# Patient Record
Sex: Female | Born: 1980 | Race: White | Hispanic: No | Marital: Married | Smoking: Never smoker
Health system: Southern US, Community
[De-identification: ages and names within clinical notes are randomized; demographics above are authoritative.]

## PROBLEM LIST (undated history)

## (undated) DIAGNOSIS — I10 Essential (primary) hypertension: Secondary | ICD-10-CM

## (undated) DIAGNOSIS — K859 Acute pancreatitis without necrosis or infection, unspecified: Secondary | ICD-10-CM

## (undated) DIAGNOSIS — M545 Low back pain, unspecified: Secondary | ICD-10-CM

## (undated) DIAGNOSIS — F1010 Alcohol abuse, uncomplicated: Secondary | ICD-10-CM

## (undated) DIAGNOSIS — G4700 Insomnia, unspecified: Secondary | ICD-10-CM

## (undated) DIAGNOSIS — F909 Attention-deficit hyperactivity disorder, unspecified type: Secondary | ICD-10-CM

## (undated) DIAGNOSIS — E119 Type 2 diabetes mellitus without complications: Secondary | ICD-10-CM

## (undated) DIAGNOSIS — F419 Anxiety disorder, unspecified: Secondary | ICD-10-CM

## (undated) DIAGNOSIS — F329 Major depressive disorder, single episode, unspecified: Secondary | ICD-10-CM

## (undated) DIAGNOSIS — F32A Depression, unspecified: Secondary | ICD-10-CM

## (undated) DIAGNOSIS — K8020 Calculus of gallbladder without cholecystitis without obstruction: Secondary | ICD-10-CM

---

## 2007-06-15 HISTORY — PX: GASTRIC BYPASS: SHX52

## 2011-02-24 HISTORY — PX: BACK SURGERY: SHX140

## 2011-03-07 HISTORY — PX: LUMBAR LAMINECTOMY: SHX95

## 2012-12-06 HISTORY — PX: HERNIA REPAIR: SHX51

## 2012-12-26 HISTORY — PX: CHOLECYSTECTOMY: SHX55

## 2013-08-28 DIAGNOSIS — Z9884 Bariatric surgery status: Secondary | ICD-10-CM | POA: Insufficient documentation

## 2014-05-13 ENCOUNTER — Emergency Department: Payer: Self-pay | Admitting: Emergency Medicine

## 2014-12-09 DIAGNOSIS — G629 Polyneuropathy, unspecified: Secondary | ICD-10-CM | POA: Insufficient documentation

## 2015-03-01 ENCOUNTER — Other Ambulatory Visit: Payer: Self-pay

## 2015-03-01 ENCOUNTER — Emergency Department: Payer: 59

## 2015-03-01 ENCOUNTER — Emergency Department
Admission: EM | Admit: 2015-03-01 | Discharge: 2015-03-01 | Disposition: A | Discharge status: Home / Self Care | Payer: 59 | Attending: Student | Admitting: Student

## 2015-03-01 ENCOUNTER — Encounter: Payer: Self-pay | Admitting: Emergency Medicine

## 2015-03-01 DIAGNOSIS — R0789 Other chest pain: Secondary | ICD-10-CM | POA: Insufficient documentation

## 2015-03-01 DIAGNOSIS — Z880 Allergy status to penicillin: Secondary | ICD-10-CM | POA: Diagnosis not present

## 2015-03-01 DIAGNOSIS — R079 Chest pain, unspecified: Secondary | ICD-10-CM

## 2015-03-01 DIAGNOSIS — Z3202 Encounter for pregnancy test, result negative: Secondary | ICD-10-CM | POA: Diagnosis not present

## 2015-03-01 DIAGNOSIS — R748 Abnormal levels of other serum enzymes: Secondary | ICD-10-CM

## 2015-03-01 DIAGNOSIS — Z9889 Other specified postprocedural states: Secondary | ICD-10-CM | POA: Diagnosis not present

## 2015-03-01 DIAGNOSIS — K859 Acute pancreatitis, unspecified: Secondary | ICD-10-CM

## 2015-03-01 DIAGNOSIS — M545 Low back pain: Secondary | ICD-10-CM | POA: Diagnosis not present

## 2015-03-01 DIAGNOSIS — G8929 Other chronic pain: Secondary | ICD-10-CM | POA: Diagnosis not present

## 2015-03-01 DIAGNOSIS — M792 Neuralgia and neuritis, unspecified: Secondary | ICD-10-CM

## 2015-03-01 LAB — URINALYSIS COMPLETE WITH MICROSCOPIC (ARMC ONLY)
BILIRUBIN URINE: NEGATIVE
Glucose, UA: NEGATIVE mg/dL
Hgb urine dipstick: NEGATIVE
Leukocytes, UA: NEGATIVE
Nitrite: NEGATIVE
Protein, ur: 30 mg/dL — AB
SPECIFIC GRAVITY, URINE: 1.033 — AB (ref 1.005–1.030)
pH: 5 (ref 5.0–8.0)

## 2015-03-01 LAB — HEPATIC FUNCTION PANEL
ALBUMIN: 3.5 g/dL (ref 3.5–5.0)
ALK PHOS: 67 U/L (ref 38–126)
ALT: 10 U/L — AB (ref 14–54)
AST: 16 U/L (ref 15–41)
Total Bilirubin: 0.1 mg/dL — ABNORMAL LOW (ref 0.3–1.2)
Total Protein: 7 g/dL (ref 6.5–8.1)

## 2015-03-01 LAB — BASIC METABOLIC PANEL
Anion gap: 6 (ref 5–15)
BUN: 18 mg/dL (ref 6–20)
CALCIUM: 8.5 mg/dL — AB (ref 8.9–10.3)
CHLORIDE: 109 mmol/L (ref 101–111)
CO2: 24 mmol/L (ref 22–32)
Creatinine, Ser: 0.68 mg/dL (ref 0.44–1.00)
GFR calc Af Amer: >60 mL/min (ref >60)
GLUCOSE: 118 mg/dL — AB (ref 65–99)
POTASSIUM: 3.3 mmol/L — AB (ref 3.5–5.1)
Sodium: 139 mmol/L (ref 135–145)

## 2015-03-01 LAB — CBC
HEMATOCRIT: 35.2 % (ref 35.0–47.0)
Hemoglobin: 11.2 g/dL — ABNORMAL LOW (ref 12.0–16.0)
MCH: 24.5 pg — ABNORMAL LOW (ref 26.0–34.0)
MCHC: 31.8 g/dL — ABNORMAL LOW (ref 32.0–36.0)
MCV: 77.1 fL — AB (ref 80.0–100.0)
PLATELETS: 316 10*3/uL (ref 150–440)
RBC: 4.56 MIL/uL (ref 3.80–5.20)
RDW: 16.5 % — ABNORMAL HIGH (ref 11.5–14.5)
WBC: 9.2 10*3/uL (ref 3.6–11.0)

## 2015-03-01 LAB — LIPASE, BLOOD: LIPASE: 67 U/L — AB (ref 22–51)

## 2015-03-01 LAB — TROPONIN I

## 2015-03-01 LAB — POCT PREGNANCY, URINE: Preg Test, Ur: NEGATIVE

## 2015-03-01 MED ORDER — SODIUM CHLORIDE 0.9 % IV BOLUS (SEPSIS)
1000.0000 mL | Freq: Once | INTRAVENOUS | Status: AC
Start: 2015-03-01 — End: 2015-03-01
  Administered 2015-03-01: 1000 mL via INTRAVENOUS

## 2015-03-01 MED ORDER — GABAPENTIN 300 MG PO CAPS
300.0000 mg | ORAL_CAPSULE | Freq: Once | ORAL | Status: AC
  Administered 2015-03-01: 300 mg via ORAL

## 2015-03-01 MED ORDER — PROMETHAZINE HCL 25 MG/ML IJ SOLN
INTRAMUSCULAR | Status: AC
  Administered 2015-03-01: 12.5 mg via INTRAVENOUS
  Filled 2015-03-01: qty 1

## 2015-03-01 MED ORDER — PROMETHAZINE HCL 25 MG RE SUPP: 25.0000 mg | Freq: Three times a day (TID) | RECTAL | Status: DC | PRN

## 2015-03-01 MED ORDER — KETOROLAC TROMETHAMINE 30 MG/ML IJ SOLN
30.0000 mg | Freq: Once | INTRAMUSCULAR | Status: AC
  Administered 2015-03-01: 30 mg via INTRAVENOUS

## 2015-03-01 MED ORDER — GABAPENTIN 300 MG PO CAPS
ORAL_CAPSULE | ORAL | Status: AC
  Administered 2015-03-01: 300 mg via ORAL
  Filled 2015-03-01: qty 1

## 2015-03-01 MED ORDER — CYCLOBENZAPRINE HCL 10 MG PO TABS
ORAL_TABLET | ORAL | Status: AC
  Administered 2015-03-01: 5 mg via ORAL
  Filled 2015-03-01: qty 1

## 2015-03-01 MED ORDER — DEXAMETHASONE SODIUM PHOSPHATE 10 MG/ML IJ SOLN
10.0000 mg | Freq: Once | INTRAMUSCULAR | Status: AC
  Administered 2015-03-01: 10 mg via INTRAMUSCULAR

## 2015-03-01 MED ORDER — KETOROLAC TROMETHAMINE 30 MG/ML IJ SOLN
INTRAMUSCULAR | Status: AC
  Administered 2015-03-01: 30 mg via INTRAVENOUS
  Filled 2015-03-01: qty 1

## 2015-03-01 MED ORDER — PROMETHAZINE HCL 25 MG/ML IJ SOLN
12.5000 mg | Freq: Once | INTRAMUSCULAR | Status: AC
  Administered 2015-03-01: 12.5 mg via INTRAVENOUS

## 2015-03-01 MED ORDER — DEXAMETHASONE SODIUM PHOSPHATE 10 MG/ML IJ SOLN
INTRAMUSCULAR | Status: AC
  Administered 2015-03-01: 10 mg via INTRAMUSCULAR
  Filled 2015-03-01: qty 1

## 2015-03-01 MED ORDER — CYCLOBENZAPRINE HCL 10 MG PO TABS
5.0000 mg | ORAL_TABLET | Freq: Once | ORAL | Status: AC
  Administered 2015-03-01: 5 mg via ORAL

## 2015-03-01 NOTE — Discharge Instructions (Signed)
Return immediately for abdominal pain that is severe, repetitive vomiting, blood in vomit or stools, inability to have a bowel movement, fever greater than 100.4, change in/worsening chest pain, any difficulty breathing, or for any other concerns.

## 2015-03-01 NOTE — ED Provider Notes (Signed)
[facility] Emergency Department Provider Note  ____________________________________________  Time seen: Approximately 8:11 AM  I have reviewed the triage vital signs and the nursing notes.   HISTORY  Chief Complaint Leg Pain and Chest Pain    HPI Stephanie Levy is a 34 y.o. 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 The patient reports that she has had this pain since February however over the past 2 weeks her doctors have been reducing her morphine and now her pain in her left leg is intolerable. She denies any trauma or swelling to the leg. She was prescribed gabapentin but has not been able to fill it yet due to insurance issues. She reports that since tapering her morphine she has also had 2 weeks of nausea, nonbloody nonbilious emesis, nonbloody diarrhea. Last night while vomiting she developed central squeezing/chest tightness which has been constant since onset. Not pleuritic in nature, not associated with any shortness of breath. No fevers. No dysuria. Movement the left leg makes the pain worse. Current severity 9 out of 10.   History reviewed. No pertinent past medical history.  There are no active problems to display for this patient.   Past Surgical History  Procedure Laterality Date  . Abdominal surgery    . Back surgery    . Cholecystectomy    . Hernia repair      No current outpatient prescriptions on file.  Allergies Peanut-containing drug products; Amoxicillin; and Citrus  No family history on file.  Social History History  Substance Use Topics  . Smoking status: Not on file  . Smokeless tobacco: Not on file  . Alcohol Use: Not on file    Review of Systems Constitutional: No fever/chills Eyes: No visual changes. ENT: No sore throat. Cardiovascular: + chest pain. Respiratory: Denies shortness of breath. Gastrointestinal: No abdominal pain.   + nausea, + vomiting.  + diarrhea.  No constipation. Genitourinary: Negative for dysuria. Musculoskeletal: + for chronic back pain. Skin: Negative for rash. Neurological: Negative for headaches, focal weakness or numbness.  10-point ROS otherwise negative.  ____________________________________________   PHYSICAL EXAM:  VITAL SIGNS: ED Triage Vitals  Enc Vitals Group     BP 03/01/15 0711 108/79 mmHg     Pulse Rate 03/01/15 0711 79     Resp 03/01/15 0711 18     Temp 03/01/15 0711 98.4 F (36.9 C)     Temp Source 03/01/15 0711 Oral     SpO2 03/01/15 0711 100 %     Weight 03/01/15 0711 263 lb (119.296 kg)     Height 03/01/15 0711  (1.727 m)     Head Cir --      Peak Flow --      Pain Score 03/01/15 0724 10     Pain Loc --      Pain Edu? --      Excl. in GC? --     Constitutional: Alert and oriented. Well appearing and in no acute distress. Eyes: Conjunctivae are normal. PERRL. EOMI. Head: Atraumatic. Nose: No congestion/rhinnorhea. Mouth/Throat: Mucous membranes are moist.  Oropharynx non-erythematous. Neck: No stridor.  Cardiovascular: Normal rate, regular rhythm. Grossly normal heart sounds.  Good peripheral circulation. Respiratory: Normal respiratory effort.  No retractions. Lungs CTAB. Gastrointestinal: Soft and nontender. No distention. No abdominal bruits. No CVA tenderness. Genitourinary: deferred Musculoskeletal: No lower extremity tenderness nor edema.  No joint effusions. Neurologic:  Normal speech and language. No  gross focal neurologic deficits are appreciated. Speech is normal. 5 out of 5 strength in bilateral upper and lower extremities, strong plantar and dorsi flexion in bilateral big toes, positive straight leg raise at 15 in the left lower extremity Skin:  Skin is warm, dry and intact. No rash noted. Psychiatric: Mood and affect are normal. Speech and behavior are normal.  ____________________________________________   LABS (all labs ordered are  listed, but only abnormal results are displayed)  Labs Reviewed  CBC - Abnormal; Notable for the following:    Hemoglobin 11.2 (*)    MCV 77.1 (*)    MCH 24.5 (*)    MCHC 31.8 (*)    RDW 16.5 (*)    All other components within normal limits  BASIC METABOLIC PANEL - Abnormal; Notable for the following:    Potassium 3.3 (*)    Glucose, Bld 118 (*)    Calcium 8.5 (*)    All other components within normal limits  HEPATIC FUNCTION PANEL - Abnormal; Notable for the following:    ALT 10 (*)    Total Bilirubin 0.1 (*)    Bilirubin, Direct <0.1 (*)    All other components within normal limits  LIPASE, BLOOD - Abnormal; Notable for the following:    Lipase 67 (*)    All other components within normal limits  URINALYSIS COMPLETEWITH MICROSCOPIC ([facility] ONLY) - Abnormal; Notable for the following:    Color, Urine YELLOW (*)    APPearance CLOUDY (*)    Ketones, ur TRACE (*)    Specific Gravity, Urine 1.033 (*)    Protein, ur 30 (*)    Bacteria, UA FEW (*)    Squamous Epithelial / LPF 6-30 (*)    All other components within normal limits  TROPONIN I  POC URINE PREG, ED  POCT PREGNANCY, URINE   ____________________________________________  EKG  ED ECG REPORT I, Gayla Doss, the attending physician, personally viewed and interpreted this ECG.   Date: 03/01/2015  EKG Time: 07:34  Rate: 79  Rhythm: normal EKG, normal sinus rhythm  Axis: Normal  Intervals:normal intervals   ST&T Change: No acute ST segment change  ____________________________________________  RADIOLOGY  CXR IMPRESSION: No active cardiopulmonary disease.  ____________________________________________   PROCEDURES  Procedure(s) performed: None  Critical Care performed: No  ____________________________________________   INITIAL IMPRESSION / ASSESSMENT AND PLAN / ED COURSE  Pertinent labs & imaging results that were available during my care of the patient were reviewed by me and considered in my  medical decision making (see chart for details).  Stephanie Levy is a 34 y.o. 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 On exam, she is generally well-appearing and in no acute distress. Vital signs stable, afebrile. EKG reassuring, troponin negative, no risk factors for PE and she is PERC negative. I suspect her chest pain is related to her vomiting/GI in nature. We'll treat her pain with nonnarcotics, give her IV fluids as well as Phenergan given her report of nausea and vomiting. She has had improvement in back pain/leg pain with steroids in the past so will give IM Decadron.  ----------------------------------------- 10:18 AM on 03/01/2015 -----------------------------------------  Labs with mild anemia and Lipase is elevated at 67 however patient has no abdominal pain at this time and she is tolerating by mouth intake. She is hemodynamic stable without tachycardia or hypotension, no ketones in her urine and she continues to appear well-hydrated. We discussed  bowel rest and immediate return to the emergency department for development of abdominal pain and/or recurrent vomiting despite phenergan. She voices understanding of this and is comfortable with discharge. She reports her pain in her leg is improved and she will follow-up with her primary care doctor. We'll discharge with Phenergan suppositories.  ____________________________________________   FINAL CLINICAL IMPRESSION(S) / ED DIAGNOSES  Final diagnoses:  Neuropathic pain of lower extremity, left  Chest pain, unspecified chest pain type  Elevated lipase      Gayla Doss, MD 03/01/15 1021

## 2015-03-01 NOTE — ED Notes (Signed)
midsternal chest pain, leg pain , n/v/d,  Starting last night

## 2015-03-05 ENCOUNTER — Emergency Department: Payer: 59

## 2015-03-05 ENCOUNTER — Encounter: Payer: Self-pay | Admitting: Emergency Medicine

## 2015-03-05 ENCOUNTER — Emergency Department
Admission: EM | Admit: 2015-03-05 | Discharge: 2015-03-05 | Disposition: A | Discharge status: Home / Self Care | Payer: 59 | Attending: Emergency Medicine | Admitting: Emergency Medicine

## 2015-03-05 DIAGNOSIS — Z880 Allergy status to penicillin: Secondary | ICD-10-CM | POA: Diagnosis not present

## 2015-03-05 DIAGNOSIS — R197 Diarrhea, unspecified: Secondary | ICD-10-CM | POA: Diagnosis not present

## 2015-03-05 DIAGNOSIS — Z3202 Encounter for pregnancy test, result negative: Secondary | ICD-10-CM | POA: Diagnosis not present

## 2015-03-05 DIAGNOSIS — R112 Nausea with vomiting, unspecified: Secondary | ICD-10-CM | POA: Diagnosis not present

## 2015-03-05 DIAGNOSIS — Z79899 Other long term (current) drug therapy: Secondary | ICD-10-CM | POA: Insufficient documentation

## 2015-03-05 DIAGNOSIS — R1084 Generalized abdominal pain: Secondary | ICD-10-CM | POA: Insufficient documentation

## 2015-03-05 LAB — COMPREHENSIVE METABOLIC PANEL
ALBUMIN: 3.6 g/dL (ref 3.5–5.0)
ALK PHOS: 79 U/L (ref 38–126)
ALT: 17 U/L (ref 14–54)
ANION GAP: 7 (ref 5–15)
AST: 13 U/L — AB (ref 15–41)
BUN: 20 mg/dL (ref 6–20)
CO2: 27 mmol/L (ref 22–32)
CREATININE: 0.63 mg/dL (ref 0.44–1.00)
Calcium: 8.9 mg/dL (ref 8.9–10.3)
Chloride: 109 mmol/L (ref 101–111)
GFR calc Af Amer: >60 mL/min (ref >60)
GFR calc non Af Amer: >60 mL/min (ref >60)
Glucose, Bld: 96 mg/dL (ref 65–99)
Potassium: 3.4 mmol/L — ABNORMAL LOW (ref 3.5–5.1)
Sodium: 143 mmol/L (ref 135–145)
Total Bilirubin: 0.2 mg/dL — ABNORMAL LOW (ref 0.3–1.2)
Total Protein: 7.2 g/dL (ref 6.5–8.1)

## 2015-03-05 LAB — CBC WITH DIFFERENTIAL/PLATELET
Basophils Absolute: 0 10*3/uL (ref 0–0.1)
Basophils Relative: 1 %
EOS ABS: 0.3 10*3/uL (ref 0–0.7)
Eosinophils Relative: 3 %
HCT: 35.2 % (ref 35.0–47.0)
Hemoglobin: 11 g/dL — ABNORMAL LOW (ref 12.0–16.0)
LYMPHS ABS: 2.5 10*3/uL (ref 1.0–3.6)
Lymphocytes Relative: 29 %
MCH: 24 pg — AB (ref 26.0–34.0)
MCHC: 31.1 g/dL — ABNORMAL LOW (ref 32.0–36.0)
MCV: 77 fL — AB (ref 80.0–100.0)
MONOS PCT: 8 %
Monocytes Absolute: 0.7 10*3/uL (ref 0.2–0.9)
NEUTROS PCT: 59 %
Neutro Abs: 5.3 10*3/uL (ref 1.4–6.5)
PLATELETS: 332 10*3/uL (ref 150–440)
RBC: 4.57 MIL/uL (ref 3.80–5.20)
RDW: 16.3 % — AB (ref 11.5–14.5)
WBC: 8.9 10*3/uL (ref 3.6–11.0)

## 2015-03-05 LAB — URINALYSIS COMPLETE WITH MICROSCOPIC (ARMC ONLY)
Bacteria, UA: NONE SEEN
Bilirubin Urine: NEGATIVE
GLUCOSE, UA: 50 mg/dL — AB
KETONES UR: NEGATIVE mg/dL
LEUKOCYTES UA: NEGATIVE
Nitrite: NEGATIVE
PH: 5 (ref 5.0–8.0)
Protein, ur: NEGATIVE mg/dL
Specific Gravity, Urine: 1.028 (ref 1.005–1.030)

## 2015-03-05 LAB — LIPASE, BLOOD: LIPASE: 65 U/L — AB (ref 22–51)

## 2015-03-05 LAB — PREGNANCY, URINE: Preg Test, Ur: NEGATIVE

## 2015-03-05 MED ORDER — FAMOTIDINE 40 MG PO TABS
40.0000 mg | ORAL_TABLET | Freq: Every evening | ORAL | Status: DC
Start: 2015-03-05 — End: 2017-10-13

## 2015-03-05 MED ORDER — IOHEXOL 240 MG/ML SOLN
25.0000 mL | Freq: Once | INTRAMUSCULAR | Status: AC | PRN
  Administered 2015-03-05: 25 mL via ORAL

## 2015-03-05 MED ORDER — DICYCLOMINE HCL 10 MG PO CAPS: 10.0000 mg | ORAL_CAPSULE | Freq: Four times a day (QID) | ORAL | Status: DC

## 2015-03-05 MED ORDER — IOHEXOL 350 MG/ML SOLN
100.0000 mL | Freq: Once | INTRAVENOUS | Status: AC | PRN
  Administered 2015-03-05: 100 mL via INTRAVENOUS

## 2015-03-05 MED ORDER — ONDANSETRON HCL 4 MG/2ML IJ SOLN
INTRAMUSCULAR | Status: AC
  Administered 2015-03-05: 4 mg via INTRAVENOUS
  Filled 2015-03-05: qty 2

## 2015-03-05 MED ORDER — ONDANSETRON HCL 4 MG/2ML IJ SOLN
4.0000 mg | Freq: Once | INTRAMUSCULAR | Status: AC
  Administered 2015-03-05: 4 mg via INTRAVENOUS

## 2015-03-05 MED ORDER — ONDANSETRON 4 MG PO TBDP: 4.0000 mg | ORAL_TABLET | Freq: Three times a day (TID) | ORAL | Status: DC | PRN

## 2015-03-05 NOTE — ED Notes (Addendum)
Pt reports here over weekend for N/V and told to return for persistent symptoms; st dx with pancreatitis; st right lower abd pain

## 2015-03-05 NOTE — ED Notes (Signed)
Patient stable at time of discharge. Patient ambulatory, and requesting immediate discharge stating "ya'll aint doing nothing for me"

## 2015-03-05 NOTE — ED Notes (Signed)
Pt has finished PO contrast. CT made aware.

## 2015-03-05 NOTE — ED Provider Notes (Signed)
[facility] Emergency Department Provider Note  ____________________________________________  Time seen: Approximately 541 AM  I have reviewed the triage vital signs and the nursing notes.   HISTORY  Chief Complaint No chief complaint on file.    HPI Stephanie Levy is a 34 y.o. female who comes into the hospital with nausea vomiting and diarrhea for 2 weeks. The patient reports that she saw Dr. Inocencio Homes over the weekend and was told that if it did not get better she should come back in for further evaluation. The patient reports that while she has been at home she's had continued abdominal pain and it has been difficult to keep down water and her medicines. The patient reports that she is also had continued diarrhea. She reports that the pain is in her right upper and right lower quadrant. The patient did send an email to her primary care physician but did not receive a response today. The patient has not had any fevers. She reports that her pain as a 9 out of 10 in intensity. The patient is here for further evaluation.   History reviewed. No pertinent past medical history.  There are no active problems to display for this patient.   Past Surgical History  Procedure Laterality Date  . Abdominal surgery    . Back surgery    . Cholecystectomy    . Hernia repair      Current Outpatient Rx  Name  Route  Sig  Dispense  Refill  . amphetamine-dextroamphetamine (ADDERALL XR) 25 MG 24 hr capsule   Oral   Take 25 mg by mouth every morning.         . busPIRone (BUSPAR) 15 MG tablet   Oral   Take 15 mg by mouth 2 (two) times daily.         Marland Kitchen desvenlafaxine (PRISTIQ) 100 MG 24 hr tablet   Oral   Take 100 mg by mouth daily.         Marland Kitchen omeprazole (PRILOSEC) 20 MG capsule   Oral   Take 20 mg by mouth daily.         Marland Kitchen oxycodone (OXY-IR) 5 MG capsule   Oral   Take 10 mg by mouth 2 (two) times daily.         . promethazine (PHENERGAN) 25 MG  suppository   Rectal   Place 1 suppository (25 mg total) rectally every 8 (eight) hours as needed for nausea or vomiting.   15 suppository   0     Allergies Peanut-containing drug products; Amoxicillin; and Citrus  No family history on file.  Social History History  Substance Use Topics  . Smoking status: Never Smoker   . Smokeless tobacco: Not on file  . Alcohol Use: No    Review of Systems Constitutional: No fever/chills Eyes: No visual changes. ENT: No sore throat. Cardiovascular: chest pain. Respiratory: Denies shortness of breath. Gastrointestinal: Abdominal pain, nausea, vomiting and diarrhea Genitourinary: Negative for dysuria. Musculoskeletal:  back pain. Skin: Negative for rash. Neurological: Negative for headaches.  10-point ROS otherwise negative.  ____________________________________________   PHYSICAL EXAM:  VITAL SIGNS: ED Triage Vitals  Enc Vitals Group     BP 03/05/15 0047 148/99 mmHg     Pulse Rate 03/05/15 0047 87     Resp 03/05/15 0047 20     Temp 03/05/15 0047 98 F (36.7 C)     Temp Source 03/05/15 0047 Oral     SpO2 03/05/15 0047 99 %  Weight 03/05/15 0047 263 lb (119.296 kg)     Height 03/05/15 0047 5\' 8"  (1.727 m)     Head Cir --      Peak Flow --      Pain Score 03/05/15 0510 10     Pain Loc --      Pain Edu? --      Excl. in GC? --     Constitutional: Alert and oriented. Well appearing and in mild distress. Eyes: Conjunctivae are normal. PERRL. EOMI. Head: Atraumatic. Nose: No congestion/rhinnorhea. Mouth/Throat: Mucous membranes are moist.  Oropharynx non-erythematous. Cardiovascular: Normal rate, regular rhythm. Grossly normal heart sounds.  Good peripheral circulation. Respiratory: Normal respiratory effort.  No retractions. Lungs CTAB. Gastrointestinal: Soft and tender in right lower quadrant and right upper quadrant. No distention. Positive bowel sounds Genitourinary: Deferred Musculoskeletal: No lower extremity  tenderness nor edema.  No joint effusions. Neurologic:  Normal speech and language. No gross focal neurologic deficits are appreciated.  Skin:  Skin is warm, dry and intact. No rash noted. Psychiatric: Mood and affect are normal.   ____________________________________________   LABS (all labs ordered are listed, but only abnormal results are displayed)  Labs Reviewed  CBC WITH DIFFERENTIAL/PLATELET - Abnormal; Notable for the following:    Hemoglobin 11.0 (*)    MCV 77.0 (*)    MCH 24.0 (*)    MCHC 31.1 (*)    RDW 16.3 (*)    All other components within normal limits  COMPREHENSIVE METABOLIC PANEL - Abnormal; Notable for the following:    Potassium 3.4 (*)    AST 13 (*)    Total Bilirubin 0.2 (*)    All other components within normal limits  LIPASE, BLOOD - Abnormal; Notable for the following:    Lipase 65 (*)    All other components within normal limits  URINALYSIS COMPLETEWITH MICROSCOPIC ([facility] ONLY) - Abnormal; Notable for the following:    Color, Urine YELLOW (*)    APPearance CLEAR (*)    Glucose, UA 50 (*)    Hgb urine dipstick 3+ (*)    Squamous Epithelial / LPF 0-5 (*)    All other components within normal limits  PREGNANCY, URINE   ____________________________________________  EKG  None ____________________________________________  RADIOLOGY  CT abdomen and pelvis: No acute findings in the abdomen or pelvis to account for the patient's symptoms. Normal appendix. Postoperative changes and incidental findings as above. ____________________________________________   PROCEDURES  Procedure(s) performed: None  Critical Care performed: No  ____________________________________________   INITIAL IMPRESSION / ASSESSMENT AND PLAN / ED COURSE  Pertinent labs & imaging results that were available during my care of the patient were reviewed by me and considered in my medical decision making (see chart for details).  The patient is a 34 year old female who  comes in with nausea vomiting and diarrhea for 2 weeks as well as some abdominal pain. The patient was seen previously but did not receive any imaging. I will give the patient a CT scan to evaluate her abdominal pain.  The patient did receive some Zofran while in the emergency department. The patient's CT scan is unremarkable. I am unsure the cause of the patient's abdominal pain vomiting and diarrhea but I feel that the patient needs to now be evaluated by gastroenterology for further evaluation of her continual symptoms. The patient was able to drink her contrast without any episodes of emesis here in the ED. I feel that the patient will be able to tolerate oral fluids with  Zofran at home. I will also give the patient a prescription for Pepcid and Carafate for possible gastritis. ____________________________________________   FINAL CLINICAL IMPRESSION(S) / ED DIAGNOSES  Final diagnoses:  Abdominal pain  Vomiting  Diarrhea       Rebecka Apley, MD 03/05/15 [phone]

## 2015-03-05 NOTE — ED Notes (Signed)
E signature not working. Patient received paperwork, understands all explained. Dcd at [phone]

## 2015-03-05 NOTE — Discharge Instructions (Signed)
Abdominal Pain  Many things can cause abdominal pain. Usually, abdominal pain is not caused by a disease and will improve without treatment. It can often be observed and treated at home. Your health care provider will do a physical exam and possibly order blood tests and X-rays to help determine the seriousness of your pain. However, in many cases, more time must pass before a clear cause of the pain can be found. Before that point, your health care provider may not know if you need more testing or further treatment.  HOME CARE INSTRUCTIONS   Monitor your abdominal pain for any changes. The following actions may help to alleviate any discomfort you are experiencing:   Only take over-the-counter or prescription medicines as directed by your health care provider.   Do not take laxatives unless directed to do so by your health care provider.   Try a clear liquid diet (broth, tea, or water) as directed by your health care provider. Slowly move to a bland diet as tolerated.  SEEK MEDICAL CARE IF:   You have unexplained abdominal pain.   You have abdominal pain associated with nausea or diarrhea.   You have pain when you urinate or have a bowel movement.   You experience abdominal pain that wakes you in the night.   You have abdominal pain that is worsened or improved by eating food.   You have abdominal pain that is worsened with eating fatty foods.   You have a fever.  SEEK IMMEDIATE MEDICAL CARE IF:    Your pain does not go away within 2 hours.   You keep throwing up (vomiting).   Your pain is felt only in portions of the abdomen, such as the right side or the left lower portion of the abdomen.   You pass bloody or black tarry stools.  MAKE SURE YOU:   Understand these instructions.    Will watch your condition.    Will get help right away if you are not doing well or get worse.   Document Released: 06/30/2005 Document Revised: 09/25/2013 Document Reviewed: 05/30/2013  ExitCare Patient Information  2015 [employer]. This information is not intended to replace advice given to you by your health care provider. Make sure you discuss any questions you have with your health care provider.  Diarrhea  Diarrhea is frequent loose and watery bowel movements. It can cause you to feel weak and dehydrated. Dehydration can cause you to become tired and thirsty, have a dry mouth, and have decreased urination that often is dark yellow. Diarrhea is a sign of another problem, most often an infection that will not last long. In most cases, diarrhea typically lasts 2-3 days. However, it can last longer if it is a sign of something more serious. It is important to treat your diarrhea as directed by your caregiver to lessen or prevent future episodes of diarrhea.  CAUSES   Some common causes include:   Gastrointestinal infections caused by viruses, bacteria, or parasites.   Food poisoning or food allergies.   Certain medicines, such as antibiotics, chemotherapy, and laxatives.   Artificial sweeteners and fructose.   Digestive disorders.  HOME CARE INSTRUCTIONS   Ensure adequate fluid intake (hydration): Have 1 cup (8 oz) of fluid for each diarrhea episode. Avoid fluids that contain simple sugars or sports drinks, fruit juices, whole milk products, and sodas. Your urine should be clear or pale yellow if you are drinking enough fluids. Hydrate with an oral rehydration solution that you   can purchase at pharmacies, retail stores, and online. You can prepare an oral rehydration solution at home by mixing the following ingredients together:    - tsp table salt.    tsp baking soda.    tsp salt substitute containing potassium chloride.   1  tablespoons sugar.   1 L (34 oz) of water.   Certain foods and beverages may increase the speed at which food moves through the gastrointestinal (GI) tract. These foods and beverages should be avoided and include:   Caffeinated and alcoholic beverages.   High-fiber foods, such as raw  fruits and vegetables, nuts, seeds, and whole grain breads and cereals.   Foods and beverages sweetened with sugar alcohols, such as xylitol, sorbitol, and mannitol.   Some foods may be well tolerated and may help thicken stool including:   Starchy foods, such as rice, toast, pasta, low-sugar cereal, oatmeal, grits, baked potatoes, crackers, and bagels.   Bananas.   Applesauce.   Add probiotic-rich foods to help increase healthy bacteria in the GI tract, such as yogurt and fermented milk products.   Wash your hands well after each diarrhea episode.   Only take over-the-counter or prescription medicines as directed by your caregiver.   Take a warm bath to relieve any burning or pain from frequent diarrhea episodes.  SEEK IMMEDIATE MEDICAL CARE IF:    You are unable to keep fluids down.   You have persistent vomiting.   You have blood in your stool, or your stools are black and tarry.   You do not urinate in 6-8 hours, or there is only a small amount of very dark urine.   You have abdominal pain that increases or localizes.   You have weakness, dizziness, confusion, or light-headedness.   You have a severe headache.   Your diarrhea gets worse or does not get better.   You have a fever or persistent symptoms for more than 2-3 days.   You have a fever and your symptoms suddenly get worse.  MAKE SURE YOU:    Understand these instructions.   Will watch your condition.   Will get help right away if you are not doing well or get worse.  Document Released: 09/10/2002 Document Revised: 02/04/2014 Document Reviewed: 05/28/2012  ExitCare Patient Information 2015 [employer]. This information is not intended to replace advice given to you by your health care provider. Make sure you discuss any questions you have with your health care provider.  Nausea and Vomiting  Nausea is a sick feeling that often comes before throwing up (vomiting). Vomiting is a reflex where stomach contents come out of your mouth.  Vomiting can cause severe loss of body fluids (dehydration). Children and elderly adults can become dehydrated quickly, especially if they also have diarrhea. Nausea and vomiting are symptoms of a condition or disease. It is important to find the cause of your symptoms.  CAUSES    Direct irritation of the stomach lining. This irritation can result from increased acid production (gastroesophageal reflux disease), infection, food poisoning, taking certain medicines (such as nonsteroidal anti-inflammatory drugs), alcohol use, or tobacco use.   Signals from the brain.These signals could be caused by a headache, heat exposure, an inner ear disturbance, increased pressure in the brain from injury, infection, a tumor, or a concussion, pain, emotional stimulus, or metabolic problems.   An obstruction in the gastrointestinal tract (bowel obstruction).   Illnesses such as diabetes, hepatitis, gallbladder problems, appendicitis, kidney problems, cancer, sepsis, atypical symptoms of a   heart attack, or eating disorders.   Medical treatments such as chemotherapy and radiation.   Receiving medicine that makes you sleep (general anesthetic) during surgery.  DIAGNOSIS  Your caregiver may ask for tests to be done if the problems do not improve after a few days. Tests may also be done if symptoms are severe or if the reason for the nausea and vomiting is not clear. Tests may include:   Urine tests.   Blood tests.   Stool tests.   Cultures (to look for evidence of infection).   X-rays or other imaging studies.  Test results can help your caregiver make decisions about treatment or the need for additional tests.  TREATMENT  You need to stay well hydrated. Drink frequently but in small amounts.You may wish to drink water, sports drinks, clear broth, or eat frozen ice pops or gelatin dessert to help stay hydrated.When you eat, eating slowly may help prevent nausea.There are also some antinausea medicines that may help  prevent nausea.  HOME CARE INSTRUCTIONS    Take all medicine as directed by your caregiver.   If you do not have an appetite, do not force yourself to eat. However, you must continue to drink fluids.   If you have an appetite, eat a normal diet unless your caregiver tells you differently.   Eat a variety of complex carbohydrates (rice, wheat, potatoes, bread), lean meats, yogurt, fruits, and vegetables.   Avoid high-fat foods because they are more difficult to digest.   Drink enough water and fluids to keep your urine clear or pale yellow.   If you are dehydrated, ask your caregiver for specific rehydration instructions. Signs of dehydration may include:   Severe thirst.   Dry lips and mouth.   Dizziness.   Dark urine.   Decreasing urine frequency and amount.   Confusion.   Rapid breathing or pulse.  SEEK IMMEDIATE MEDICAL CARE IF:    You have blood or brown flecks (like coffee grounds) in your vomit.   You have black or bloody stools.   You have a severe headache or stiff neck.   You are confused.   You have severe abdominal pain.   You have chest pain or trouble breathing.   You do not urinate at least once every 8 hours.   You develop cold or clammy skin.   You continue to vomit for longer than 24 to 48 hours.   You have a fever.  MAKE SURE YOU:    Understand these instructions.   Will watch your condition.   Will get help right away if you are not doing well or get worse.  Document Released: 09/20/2005 Document Revised: 12/13/2011 Document Reviewed: 02/17/2011  ExitCare Patient Information 2015 [employer]. This information is not intended to replace advice given to you by your health care provider. Make sure you discuss any questions you have with your health care provider.

## 2017-03-25 DIAGNOSIS — S92132A Displaced fracture of posterior process of left talus, initial encounter for closed fracture: Secondary | ICD-10-CM | POA: Insufficient documentation

## 2017-09-16 DIAGNOSIS — G894 Chronic pain syndrome: Secondary | ICD-10-CM | POA: Insufficient documentation

## 2017-10-13 ENCOUNTER — Emergency Department: Payer: BLUE CROSS/BLUE SHIELD

## 2017-10-13 ENCOUNTER — Encounter: Payer: Self-pay | Admitting: Emergency Medicine

## 2017-10-13 ENCOUNTER — Observation Stay
Admission: EM | Admit: 2017-10-13 | Discharge: 2017-10-15 | Disposition: A | Discharge status: Home / Self Care | Payer: BLUE CROSS/BLUE SHIELD | Attending: Specialist | Admitting: Specialist

## 2017-10-13 ENCOUNTER — Other Ambulatory Visit: Payer: Self-pay

## 2017-10-13 ENCOUNTER — Observation Stay: Payer: BLUE CROSS/BLUE SHIELD

## 2017-10-13 DIAGNOSIS — Z9884 Bariatric surgery status: Secondary | ICD-10-CM | POA: Diagnosis not present

## 2017-10-13 DIAGNOSIS — Z7982 Long term (current) use of aspirin: Secondary | ICD-10-CM | POA: Insufficient documentation

## 2017-10-13 DIAGNOSIS — E871 Hypo-osmolality and hyponatremia: Secondary | ICD-10-CM | POA: Diagnosis not present

## 2017-10-13 DIAGNOSIS — E860 Dehydration: Secondary | ICD-10-CM | POA: Diagnosis not present

## 2017-10-13 DIAGNOSIS — I110 Hypertensive heart disease with heart failure: Secondary | ICD-10-CM | POA: Insufficient documentation

## 2017-10-13 DIAGNOSIS — R9431 Abnormal electrocardiogram [ECG] [EKG]: Secondary | ICD-10-CM

## 2017-10-13 DIAGNOSIS — F419 Anxiety disorder, unspecified: Secondary | ICD-10-CM | POA: Insufficient documentation

## 2017-10-13 DIAGNOSIS — Z79899 Other long term (current) drug therapy: Secondary | ICD-10-CM | POA: Diagnosis not present

## 2017-10-13 DIAGNOSIS — F909 Attention-deficit hyperactivity disorder, unspecified type: Secondary | ICD-10-CM | POA: Diagnosis not present

## 2017-10-13 DIAGNOSIS — K760 Fatty (change of) liver, not elsewhere classified: Secondary | ICD-10-CM | POA: Diagnosis not present

## 2017-10-13 DIAGNOSIS — R945 Abnormal results of liver function studies: Secondary | ICD-10-CM

## 2017-10-13 DIAGNOSIS — Z6841 Body Mass Index (BMI) 40.0 and over, adult: Secondary | ICD-10-CM | POA: Diagnosis not present

## 2017-10-13 DIAGNOSIS — E119 Type 2 diabetes mellitus without complications: Secondary | ICD-10-CM | POA: Insufficient documentation

## 2017-10-13 DIAGNOSIS — E876 Hypokalemia: Secondary | ICD-10-CM | POA: Insufficient documentation

## 2017-10-13 DIAGNOSIS — Z880 Allergy status to penicillin: Secondary | ICD-10-CM | POA: Diagnosis not present

## 2017-10-13 DIAGNOSIS — F329 Major depressive disorder, single episode, unspecified: Secondary | ICD-10-CM | POA: Insufficient documentation

## 2017-10-13 DIAGNOSIS — Z9049 Acquired absence of other specified parts of digestive tract: Secondary | ICD-10-CM | POA: Diagnosis not present

## 2017-10-13 DIAGNOSIS — K759 Inflammatory liver disease, unspecified: Secondary | ICD-10-CM

## 2017-10-13 DIAGNOSIS — R7989 Other specified abnormal findings of blood chemistry: Secondary | ICD-10-CM

## 2017-10-13 DIAGNOSIS — G4700 Insomnia, unspecified: Secondary | ICD-10-CM | POA: Insufficient documentation

## 2017-10-13 DIAGNOSIS — I509 Heart failure, unspecified: Secondary | ICD-10-CM | POA: Diagnosis not present

## 2017-10-13 DIAGNOSIS — R778 Other specified abnormalities of plasma proteins: Secondary | ICD-10-CM

## 2017-10-13 DIAGNOSIS — R55 Syncope and collapse: Principal | ICD-10-CM | POA: Diagnosis present

## 2017-10-13 HISTORY — DX: Major depressive disorder, single episode, unspecified: F32.9

## 2017-10-13 HISTORY — DX: Calculus of gallbladder without cholecystitis without obstruction: K80.20

## 2017-10-13 HISTORY — DX: Alcohol abuse, uncomplicated: F10.10

## 2017-10-13 HISTORY — DX: Depression, unspecified: F32.A

## 2017-10-13 HISTORY — DX: Insomnia, unspecified: G47.00

## 2017-10-13 HISTORY — DX: Essential (primary) hypertension: I10

## 2017-10-13 HISTORY — DX: Anxiety disorder, unspecified: F41.9

## 2017-10-13 HISTORY — DX: Type 2 diabetes mellitus without complications: E11.9

## 2017-10-13 HISTORY — DX: Low back pain, unspecified: M54.50

## 2017-10-13 HISTORY — DX: Attention-deficit hyperactivity disorder, unspecified type: F90.9

## 2017-10-13 HISTORY — DX: Morbid (severe) obesity due to excess calories: E66.01

## 2017-10-13 HISTORY — DX: Acute pancreatitis without necrosis or infection, unspecified: K85.90

## 2017-10-13 HISTORY — DX: Low back pain: M54.5

## 2017-10-13 LAB — HEPATIC FUNCTION PANEL
ALK PHOS: 91 U/L (ref 38–126)
ALT: 110 U/L — ABNORMAL HIGH (ref 14–54)
AST: 290 U/L — ABNORMAL HIGH (ref 15–41)
Albumin: 3.5 g/dL (ref 3.5–5.0)
BILIRUBIN TOTAL: 1.4 mg/dL — AB (ref 0.3–1.2)
Bilirubin, Direct: 0.3 mg/dL (ref 0.1–0.5)
Indirect Bilirubin: 1.1 mg/dL — ABNORMAL HIGH (ref 0.3–0.9)
TOTAL PROTEIN: 7.3 g/dL (ref 6.5–8.1)

## 2017-10-13 LAB — BASIC METABOLIC PANEL
Anion gap: 13 (ref 5–15)
BUN: 10 mg/dL (ref 6–20)
CALCIUM: 8.7 mg/dL — AB (ref 8.9–10.3)
CO2: 21 mmol/L — ABNORMAL LOW (ref 22–32)
CREATININE: 0.9 mg/dL (ref 0.44–1.00)
Chloride: 98 mmol/L — ABNORMAL LOW (ref 101–111)
Glucose, Bld: 207 mg/dL — ABNORMAL HIGH (ref 65–99)
Potassium: 3.7 mmol/L (ref 3.5–5.1)
Sodium: 132 mmol/L — ABNORMAL LOW (ref 135–145)

## 2017-10-13 LAB — CBC
HCT: 37.2 % (ref 35.0–47.0)
Hemoglobin: 11.8 g/dL — ABNORMAL LOW (ref 12.0–16.0)
MCH: 25.4 pg — ABNORMAL LOW (ref 26.0–34.0)
MCHC: 31.7 g/dL — ABNORMAL LOW (ref 32.0–36.0)
MCV: 80.3 fL (ref 80.0–100.0)
PLATELETS: 265 10*3/uL (ref 150–440)
RBC: 4.63 MIL/uL (ref 3.80–5.20)
RDW: 19.6 % — AB (ref 11.5–14.5)
WBC: 6.9 10*3/uL (ref 3.6–11.0)

## 2017-10-13 LAB — URINALYSIS, COMPLETE (UACMP) WITH MICROSCOPIC
GLUCOSE, UA: NEGATIVE mg/dL
Ketones, ur: 15 mg/dL — AB
NITRITE: NEGATIVE
PH: 6 (ref 5.0–8.0)
Protein, ur: 100 mg/dL — AB
SPECIFIC GRAVITY, URINE: 1.01 (ref 1.005–1.030)

## 2017-10-13 LAB — URINE DRUG SCREEN, QUALITATIVE (ARMC ONLY)
AMPHETAMINES, UR SCREEN: NOT DETECTED
BARBITURATES, UR SCREEN: NOT DETECTED
Benzodiazepine, Ur Scrn: NOT DETECTED
CANNABINOID 50 NG, UR ~~LOC~~: NOT DETECTED
Cocaine Metabolite,Ur ~~LOC~~: NOT DETECTED
MDMA (ECSTASY) UR SCREEN: NOT DETECTED
Methadone Scn, Ur: NOT DETECTED
OPIATE, UR SCREEN: NOT DETECTED
Phencyclidine (PCP) Ur S: NOT DETECTED
TRICYCLIC, UR SCREEN: NOT DETECTED

## 2017-10-13 LAB — INFLUENZA PANEL BY PCR (TYPE A & B)
INFLAPCR: NEGATIVE
Influenza B By PCR: NEGATIVE

## 2017-10-13 LAB — LIPASE, BLOOD: Lipase: 60 U/L — ABNORMAL HIGH (ref 11–51)

## 2017-10-13 LAB — TROPONIN I
Troponin I: 0.15 ng/mL (ref <0.03)
Troponin I: 0.1 ng/mL (ref <0.03)
Troponin I: 0.06 ng/mL (ref <0.03)

## 2017-10-13 LAB — PROTIME-INR
INR: 1.03
Prothrombin Time: 13.4 seconds (ref 11.4–15.2)

## 2017-10-13 LAB — ACETAMINOPHEN LEVEL: Acetaminophen (Tylenol), Serum: <10 ug/mL — ABNORMAL LOW (ref 10–30)

## 2017-10-13 LAB — MAGNESIUM: Magnesium: 1.5 mg/dL — ABNORMAL LOW (ref 1.7–2.4)

## 2017-10-13 LAB — APTT: aPTT: 25 seconds (ref 24–36)

## 2017-10-13 MED ORDER — AMPHETAMINE-DEXTROAMPHETAMINE 5 MG PO TABS
20.0000 mg | ORAL_TABLET | ORAL | Status: DC
  Filled 2017-10-13: qty 4

## 2017-10-13 MED ORDER — MAGNESIUM SULFATE 2 GM/50ML IV SOLN
2.0000 g | Freq: Once | INTRAVENOUS | Status: AC
  Administered 2017-10-13: 2 g via INTRAVENOUS
  Filled 2017-10-13: qty 50

## 2017-10-13 MED ORDER — IOPAMIDOL (ISOVUE-300) INJECTION 61%
100.0000 mL | Freq: Once | INTRAVENOUS | Status: AC | PRN
  Administered 2017-10-13: 100 mL via INTRAVENOUS

## 2017-10-13 MED ORDER — ENOXAPARIN SODIUM 40 MG/0.4ML ~~LOC~~ SOLN
40.0000 mg | Freq: Two times a day (BID) | SUBCUTANEOUS | Status: DC
  Administered 2017-10-13 – 2017-10-15 (×4): 40 mg via SUBCUTANEOUS
  Filled 2017-10-13 (×4): qty 0.4

## 2017-10-13 MED ORDER — OXYCODONE HCL 5 MG PO TABS
5.0000 mg | ORAL_TABLET | Freq: Four times a day (QID) | ORAL | Status: DC | PRN
  Administered 2017-10-13 – 2017-10-15 (×6): 5 mg via ORAL
  Filled 2017-10-13 (×6): qty 1

## 2017-10-13 MED ORDER — SODIUM CHLORIDE 0.9 % IV SOLN
INTRAVENOUS | Status: DC
  Administered 2017-10-13: 17:00:00 via INTRAVENOUS

## 2017-10-13 MED ORDER — SODIUM CHLORIDE 0.9 % IV BOLUS (SEPSIS)
250.0000 mL | Freq: Once | INTRAVENOUS | Status: AC
  Administered 2017-10-13: 250 mL via INTRAVENOUS

## 2017-10-13 MED ORDER — ASPIRIN 81 MG PO CHEW
324.0000 mg | CHEWABLE_TABLET | Freq: Once | ORAL | Status: AC
  Administered 2017-10-13: 324 mg via ORAL
  Filled 2017-10-13: qty 4

## 2017-10-13 MED ORDER — POTASSIUM CHLORIDE 20 MEQ PO PACK
40.0000 meq | PACK | Freq: Two times a day (BID) | ORAL | Status: DC
  Administered 2017-10-13 (×2): 40 meq via ORAL
  Filled 2017-10-13 (×3): qty 2

## 2017-10-13 MED ORDER — ASPIRIN EC 81 MG PO TBEC
81.0000 mg | DELAYED_RELEASE_TABLET | Freq: Every day | ORAL | Status: DC
  Administered 2017-10-14 – 2017-10-15 (×2): 81 mg via ORAL
  Filled 2017-10-13 (×2): qty 1

## 2017-10-13 MED ORDER — IOPAMIDOL (ISOVUE-300) INJECTION 61%
30.0000 mL | Freq: Once | INTRAVENOUS | Status: AC | PRN
  Administered 2017-10-13: 30 mL via ORAL

## 2017-10-13 MED ORDER — ACETAMINOPHEN 325 MG PO TABS: 650.0000 mg | ORAL_TABLET | Freq: Four times a day (QID) | ORAL | Status: DC | PRN

## 2017-10-13 MED ORDER — DESVENLAFAXINE SUCCINATE ER 50 MG PO TB24
50.0000 mg | ORAL_TABLET | Freq: Every day | ORAL | Status: DC
  Administered 2017-10-14 – 2017-10-15 (×2): 50 mg via ORAL
  Filled 2017-10-13 (×2): qty 1

## 2017-10-13 MED ORDER — MAGNESIUM OXIDE 400 (241.3 MG) MG PO TABS
400.0000 mg | ORAL_TABLET | Freq: Two times a day (BID) | ORAL | Status: DC
  Administered 2017-10-13 – 2017-10-15 (×4): 400 mg via ORAL
  Filled 2017-10-13 (×4): qty 1

## 2017-10-13 MED ORDER — CLONAZEPAM 0.5 MG PO TBDP
0.5000 mg | ORAL_TABLET | Freq: Two times a day (BID) | ORAL | Status: DC
  Administered 2017-10-13 – 2017-10-15 (×4): 0.5 mg via ORAL
  Filled 2017-10-13 (×4): qty 1

## 2017-10-13 MED ORDER — TIZANIDINE HCL 4 MG PO TABS
4.0000 mg | ORAL_TABLET | Freq: Three times a day (TID) | ORAL | Status: DC | PRN
  Administered 2017-10-13 – 2017-10-14 (×2): 4 mg via ORAL
  Filled 2017-10-13 (×5): qty 1

## 2017-10-13 MED ORDER — AMPHETAMINE-DEXTROAMPHET ER 5 MG PO CP24: 25.0000 mg | ORAL_CAPSULE | Freq: Three times a day (TID) | ORAL | Status: DC

## 2017-10-13 MED ORDER — ACETAMINOPHEN 650 MG RE SUPP: 650.0000 mg | Freq: Four times a day (QID) | RECTAL | Status: DC | PRN

## 2017-10-13 NOTE — H&P (Signed)
[facility] [place] [facility]   PATIENT NAME: Stephanie Levy    MR#:  [id number]  DATE OF BIRTH:  1981-08-04  DATE OF ADMISSION:  10/13/2017  PRIMARY CARE PHYSICIAN: Dr Adaline Sill  REQUESTING/REFERRING PHYSICIAN: Dr Dorothea Glassman  CHIEF COMPLAINT:   Chief Complaint  Patient presents with  . Loss of Consciousness    HISTORY OF PRESENT ILLNESS:  Stephanie Levy  is a 37 y.o. female presents after not feeling well.  She had some drinks on Tuesday night 3 or 4 shots.  She thought she took her nighttime medications but at that time she could not feel her fingertips so she was not sure.  The next day she did not remember much.  She slept most of the day.  She took some Benadryl for an allergy.  Partner could not understand her speech very well.  She has not been eating or drinking anything for the last few days.  Her leg was hurting after the fall.  She stood up and felt lightheaded and dizzy and passed out for a few seconds.  She tried to take a shower and her lips were blue she got helped out of the shower and passed out again.  In the ER she also passed out after urinating.  No loss of urine or bowel function with these passout episodes.  Has had episodes only lasted for a few seconds.  Hospitalist services were contacted for further evaluation.  In the ER she was found to have a low magnesium, prolonged QTC, borderline troponin, elevated liver function test.  PAST MEDICAL HISTORY:   Past Medical History:  Diagnosis Date  . Depression     PAST SURGICAL HISTORY:   Past Surgical History:  Procedure Laterality Date  . ABDOMINAL SURGERY    . BACK SURGERY    . CHOLECYSTECTOMY    . GASTRIC BYPASS    . HERNIA REPAIR      SOCIAL HISTORY:   Social History   Tobacco Use  . Smoking status: Never Smoker  . Smokeless tobacco: Never Used  Substance Use Topics  . Alcohol use: Yes    FAMILY HISTORY:   Family History  Problem Relation Age of Onset  .  CAD Mother   . Diabetes Mother   . Hypertension Mother   . Asthma Mother   . Healthy Father     DRUG ALLERGIES:   Allergies  Allergen Reactions  . Peanut-Containing Drug Products Anaphylaxis  . Amoxicillin Other (See Comments)    Gi distress  . Citrus Rash    REVIEW OF SYSTEMS:  CONSTITUTIONAL: No fever, chills or sweats.  Positive for fatigue and sleeping a lot.  EYES: Left eye did have some blurred vision and some squiggly lines that she saw. EARS, NOSE, AND THROAT: No tinnitus or ear pain. No sore throat RESPIRATORY: No cough.  Some shortness of breath, no wheezing or hemoptysis.  CARDIOVASCULAR: No chest pain, orthopnea, edema.  GASTROINTESTINAL: No nausea, vomiting, diarrhea some . abdominal pain. No blood in bowel movements GENITOURINARY: No dysuria, hematuria.  ENDOCRINE: No polyuria, nocturia,  HEMATOLOGY: No anemia, easy bruising or bleeding SKIN: No rash or lesion. MUSCULOSKELETAL: Some leg pain NEUROLOGIC: Generalized weakness.  Numbness fingertips PSYCHIATRY:  history of depression  MEDICATIONS AT HOME:   Prior to Admission medications   Medication Sig Start Date End Date Taking? Authorizing Provider  amphetamine-dextroamphetamine (ADDERALL XR) 20 MG 24 hr capsule Take 25 mg by mouth 3 (three) times daily.  Yes [provider]  clonazePAM (KLONOPIN) 0.5 MG disintegrating tablet Take 1 tablet by mouth 2 (two) times daily. 09/19/17  Yes [provider]  desvenlafaxine (PRISTIQ) 100 MG 24 hr tablet Take 100 mg by mouth daily.   Yes [provider]  diphenhydrAMINE (BENADRYL) 25 mg capsule Take 25 mg by mouth every 6 (six) hours as needed.   Yes [provider]  oxycodone (OXY-IR) 5 MG capsule Take 10 mg by mouth 2 (two) times daily.   Yes [provider]  dicyclomine (BENTYL) 10 MG capsule Take 1 capsule (10 mg total) by mouth 4 (four) times daily. 03/05/15 03/19/15  Rebecka Apley, MD  famotidine (PEPCID) 40 MG tablet  Take 1 tablet (40 mg total) by mouth every evening. 03/05/15 03/04/16  Rebecka Apley, MD  omeprazole (PRILOSEC) 20 MG capsule Take 20 mg by mouth daily.    [provider]  ondansetron (ZOFRAN ODT) 4 MG disintegrating tablet Take 1 tablet (4 mg total) by mouth every 8 (eight) hours as needed for nausea or vomiting. Patient not taking: Reported on 10/13/2017 03/05/15   Rebecka Apley, MD  promethazine (PHENERGAN) 25 MG suppository Place 1 suppository (25 mg total) rectally every 8 (eight) hours as needed for nausea or vomiting. 03/01/15 02/29/16  Gayla Doss, MD  tiZANidine (ZANAFLEX) 4 MG tablet Take 1 tablet by mouth every 4 (four) hours as needed. 09/05/17   [provider]   Patient states that she only takes Pristiq 100 mg daily Adderall 20 mg 3 times daily tizanidine and Klonopin are both as needed.  VITAL SIGNS:  Blood pressure 112/76, pulse (!) 57, temperature 97.8 F (36.6 C), temperature source Oral, resp. rate (!) 24, height 5\' 8"  (1.727 m), weight 120.7 kg (266 lb), last menstrual period 10/10/2017, SpO2 100 %.  PHYSICAL EXAMINATION:  GENERAL:  37 y.o.-year-old patient lying in the bed with no acute distress.  EYES: Pupils equal, round, reactive to light and accommodation. No scleral icterus. Extraocular muscles intact.  HEENT: Head atraumatic, normocephalic. Oropharynx and nasopharynx clear.  NECK:  Supple, no jugular venous distention. No thyroid enlargement, no tenderness.  LUNGS: Normal breath sounds bilaterally, no wheezing, rales,rhonchi or crepitation. No use of accessory muscles of respiration.  CARDIOVASCULAR: S1, S2 normal. No murmurs, rubs, or gallops.  ABDOMEN: Soft, nontender, nondistended. Bowel sounds present. No organomegaly or mass.  EXTREMITIES: No pedal edema, cyanosis, or clubbing.  NEUROLOGIC: Cranial nerves II through XII are intact. Muscle strength 5/5 in all extremities. Sensation intact. Gait not checked.  PSYCHIATRIC: The patient is  alert and oriented x 3.  SKIN: No rash, lesion, or ulcer.   LABORATORY PANEL:   CBC Recent Labs  Lab 10/13/17 1054  WBC 6.9  HGB 11.8*  HCT 37.2  PLT 265   ------------------------------------------------------------------------------------------------------------------  Chemistries  Recent Labs  Lab 10/13/17 1054  NA 132*  K 3.7  CL 98*  CO2 21*  GLUCOSE 207*  BUN 10  CREATININE 0.90  CALCIUM 8.7*  MG 1.5*  AST 290*  ALT 110*  ALKPHOS 91  BILITOT 1.4*   ------------------------------------------------------------------------------------------------------------------  Cardiac Enzymes Recent Labs  Lab 10/13/17 1054  TROPONINI 0.15*   ------------------------------------------------------------------------------------------------------------------  RADIOLOGY:  Dg Chest 2 View  Result Date: 10/13/2017 CLINICAL DATA:  Pt in via [facility] from home with complaints of 3 syncopal episodes since yesterday. Pt unable to recall if she has hit her head. Pt appears drowsy, vitals WDL. Loss of appetite and nausea x 2 days. Denies  PMH. Never a smoker EXAM: CHEST  2 VIEW COMPARISON:  03/01/2015 FINDINGS: Shallow lung inflation. Heart size is within normal limits accounting for position. There are no focal consolidations or pleural effusions. No pulmonary edema. IMPRESSION: No active cardiopulmonary disease. Electronically Signed   By: Norva Pavlov M.D.   On: 10/13/2017 11:58   Ct Head Wo Contrast  Result Date: 10/13/2017 CLINICAL DATA:  Several syncopal episodes.  Questionable fall EXAM: CT HEAD WITHOUT CONTRAST TECHNIQUE: Contiguous axial images were obtained from the base of the skull through the vertex without intravenous contrast. COMPARISON:  None. FINDINGS: Brain: The ventricles are normal in size and configuration. There is no intracranial mass, hemorrhage, extra-axial fluid collection, or midline shift. Gray-white compartments appear normal. No acute infarct evident.  Vascular: There is no appreciable hyperdense vessel. There is no appreciable vascular calcification. Skull: The bony calvarium appears intact. Sinuses/Orbits: Visualized paranasal sinuses are clear. Visualized orbits appear symmetric bilaterally. Other: Visualized mastoid air cells are clear. IMPRESSION: Study within normal limits. Electronically Signed   By: Bretta Bang III M.D.   On: 10/13/2017 14:08   Ct Abdomen Pelvis W Contrast  Result Date: 10/13/2017 CLINICAL DATA:  Abdominal pain. Clinical suspicion for diverticulitis. EXAM: CT ABDOMEN AND PELVIS WITH CONTRAST TECHNIQUE: Multidetector CT imaging of the abdomen and pelvis was performed using the standard protocol following bolus administration of intravenous contrast. CONTRAST:  ISOVUE-300 IOPAMIDOL (ISOVUE-300) INJECTION 61% COMPARISON:  03/05/2015 FINDINGS: Lower Chest: No acute findings. Hepatobiliary: No hepatic masses identified. Prior cholecystectomy. No evidence of biliary obstruction. Pancreas:  No mass or inflammatory changes. Spleen: Within normal limits in size and appearance. Adrenals/Urinary Tract: No masses identified. No evidence of hydronephrosis. Stomach/Bowel: Previous gastric bypass surgery. No evidence of obstruction, inflammatory process or abnormal fluid collections. Normal appendix visualized. Vascular/Lymphatic: No pathologically enlarged lymph nodes. No abdominal aortic aneurysm. Reproductive:  No mass or other significant abnormality. Other:  None. Musculoskeletal: No suspicious bone lesions identified. Lumbar spine fusion hardware at L4-5. IMPRESSION: Previous gastric bypass surgery. No acute findings or other significant abnormality. Electronically Signed   By: Myles Rosenthal M.D.   On: 10/13/2017 14:13    EKG:   Normal sinus rhythm, prolonged QTC at 604  IMPRESSION AND PLAN:   1.  Syncope and not eating for the last few days.  We will give IV fluid hydration.  Monitor on telemetry.  Get MRI of the brain to  rule out stroke with her slurred speech the other day.  Obtain echocardiogram and carotid ultrasound.  Check orthostatic vital signs.  Send off influenza swab.  Empiric aspirin. 2.  Prolonged QTC.  Monitor on telemetry.  Decrease dose of Pristiq from 100 mg down to 50 mg.  Obtain echocardiogram.  Obtain cardiology consultation. 3.  Elevated liver function test.  Send off acute hepatitis profile.  Check liver function test again tomorrow after hydration. 4.  Hypomagnesemia and hypokalemia replace magnesium IV and potassium orally. 5.  Depression.  Lower dose of Pristiq from 100 mg down to 50 mg secondary to QTC prolongation. 6.  Elevated troponin.  Not quite sure what to make of this but I will get serial troponins monitor on telemetry and have cardiology evaluate.    All the records are reviewed and case discussed with ED provider. Management plans discussed with the patient, family and they are in agreement.  CODE STATUS: Full code  TOTAL TIME TAKING CARE OF THIS PATIENT: 50 minutes.    Alford Highland M.D on 10/13/2017 at 2:54 PM  Between 7am to 6pm - Pager - [phone]  After 6pm call admission pager [phone]  [facility]  [phone]  CC: Primary care physician; Dr. Adaline Sill
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
## 2017-10-13 NOTE — ED Notes (Signed)
Troponin of 0.1 was reported to Dr. Hilton Sinclair.

## 2017-10-13 NOTE — ED Notes (Signed)
Report given to [facility] control

## 2017-10-13 NOTE — ED Notes (Signed)
Patient denied need to void at this time. Patient and family were advised to call RN and stretcher could be moved to the room commode. Late entry.

## 2017-10-13 NOTE — ED Notes (Signed)
Patient taken to CT scan.

## 2017-10-13 NOTE — Progress Notes (Signed)
PHARMACIST - PHYSICIAN COMMUNICATION  CONCERNING:  Enoxaparin (Lovenox) for DVT Prophylaxis    RECOMMENDATION: Patient was prescribed enoxaprin 40mg  q24 hours for VTE prophylaxis.   Filed Weights   10/13/17 1057  Weight: 266 lb (120.7 kg)    Body mass index is 40.45 kg/m.  Estimated Creatinine Clearance: 119.3 mL/min (by C-G formula based on SCr of 0.9 mg/dL).   Based on [facility] policy patient is candidate for enoxaparin 40mg  every 12 hour dosing due to BMI being >40.  DESCRIPTION: Pharmacy has adjusted enoxaparin dose per [facility] policy, approved through [employer] committee.  Patient is now receiving enoxaparin 40mg  every 12 hours.   Cher Nakai, PharmD, BCPS Clinical Pharmacist  10/13/2017 4:46 PM

## 2017-10-13 NOTE — ED Notes (Addendum)
This RN was called into the room. CT tech Judeth Cornfield was present and stated that patient sat down on the floor when she got up from the commode. Patient denies injury from the incident. Patient and spouse did not call for help before ambulating patient to the room commode as instructed. Patient was alert and oriented upon this RN arrival. Patient was back on the commode having a bowel movement. Patient was assisted onto the stretcher and was steady at that time. Dr. Darnelle Catalan aware.

## 2017-10-13 NOTE — ED Notes (Signed)
Dr. Darnelle Catalan aware of troponin of 0.15.

## 2017-10-13 NOTE — ED Triage Notes (Signed)
Pt in via [facility] from home with complaints of 3 syncopal episodes since yesterday.  Pt unable to recall if she has hit her head.  Pt appears drowsy, vitals WDL.

## 2017-10-13 NOTE — Plan of Care (Signed)
Pt admitted this shift from ED . A&Ox4. VSS. RA . OOB with standby assist. NSR on monitor. Droplet precautions initiated per order.  No complaints thus far. Will continue to monitor and report to oncoming RN .  Progressing Education: Knowledge of General Education information will improve 10/13/2017 1712 - Progressing by Jodie Echevaria, RN Health Behavior/Discharge Planning: Ability to manage health-related needs will improve 10/13/2017 1712 - Progressing by Jodie Echevaria, RN Clinical Measurements: Ability to maintain clinical measurements within normal limits will improve 10/13/2017 1712 - Progressing by Jodie Echevaria, RN Will remain free from infection 10/13/2017 1712 - Progressing by Jodie Echevaria, RN Diagnostic test results will improve 10/13/2017 1712 - Progressing by Jodie Echevaria, RN Respiratory complications will improve 10/13/2017 1712 - Progressing by Jodie Echevaria, RN Cardiovascular complication will be avoided 10/13/2017 1712 - Progressing by Jodie Echevaria, RN Activity: Risk for activity intolerance will decrease 10/13/2017 1712 - Progressing by Jodie Echevaria, RN Nutrition: Adequate nutrition will be maintained 10/13/2017 1712 - Progressing by Jodie Echevaria, RN Coping: Level of anxiety will decrease 10/13/2017 1712 - Progressing by Jodie Echevaria, RN Elimination: Will not experience complications related to bowel motility 10/13/2017 1712 - Progressing by Jodie Echevaria, RN Will not experience complications related to urinary retention 10/13/2017 1712 - Progressing by Jodie Echevaria, RN Pain Managment: General experience of comfort will improve 10/13/2017 1712 - Progressing by Jodie Echevaria, RN Safety: Ability to remain free from injury will improve 10/13/2017 1712 - Progressing by Jodie Echevaria, RN Skin Integrity: Risk for impaired skin integrity will decrease 10/13/2017 1712 - Progressing by Jodie Echevaria, RN

## 2017-10-13 NOTE — ED Notes (Signed)
RN entered room to request urine sample. PT reports she is too afraid to have the in and out performed. Pt feels too weak to stand feels as though seh needs to have a BM so verbalized a bed pan wound not work. Pt reports she will try to stand once her wife returns to treatment room. Pt verbalized she is currently on period. RN called CT and explained pt is on period and reports there is no way she is pregnant. CT will perform CT as soon as possible.

## 2017-10-14 ENCOUNTER — Observation Stay (HOSPITAL_BASED_OUTPATIENT_CLINIC_OR_DEPARTMENT_OTHER)
Admit: 2017-10-14 | Discharge: 2017-10-14 | Disposition: A | Discharge status: Home / Self Care | Payer: BLUE CROSS/BLUE SHIELD | Attending: Internal Medicine | Admitting: Internal Medicine

## 2017-10-14 ENCOUNTER — Observation Stay: Payer: BLUE CROSS/BLUE SHIELD

## 2017-10-14 DIAGNOSIS — R55 Syncope and collapse: Secondary | ICD-10-CM

## 2017-10-14 LAB — CBC
HCT: 29.9 % — ABNORMAL LOW (ref 35.0–47.0)
HEMOGLOBIN: 9.6 g/dL — AB (ref 12.0–16.0)
MCH: 25.8 pg — ABNORMAL LOW (ref 26.0–34.0)
MCHC: 32 g/dL (ref 32.0–36.0)
MCV: 80.7 fL (ref 80.0–100.0)
Platelets: 226 10*3/uL (ref 150–440)
RBC: 3.7 MIL/uL — ABNORMAL LOW (ref 3.80–5.20)
RDW: 20.2 % — AB (ref 11.5–14.5)
WBC: 6 10*3/uL (ref 3.6–11.0)

## 2017-10-14 LAB — HEPATIC FUNCTION PANEL
ALBUMIN: 2.9 g/dL — AB (ref 3.5–5.0)
ALT: 121 U/L — ABNORMAL HIGH (ref 14–54)
AST: 229 U/L — ABNORMAL HIGH (ref 15–41)
Alkaline Phosphatase: 75 U/L (ref 38–126)
Bilirubin, Direct: <0.1 mg/dL — ABNORMAL LOW (ref 0.1–0.5)
TOTAL PROTEIN: 6 g/dL — AB (ref 6.5–8.1)
Total Bilirubin: 0.2 mg/dL — ABNORMAL LOW (ref 0.3–1.2)

## 2017-10-14 LAB — BASIC METABOLIC PANEL
ANION GAP: 7 (ref 5–15)
BUN: 12 mg/dL (ref 6–20)
CALCIUM: 8.1 mg/dL — AB (ref 8.9–10.3)
CO2: 20 mmol/L — ABNORMAL LOW (ref 22–32)
Chloride: 107 mmol/L (ref 101–111)
Creatinine, Ser: 0.67 mg/dL (ref 0.44–1.00)
GFR calc Af Amer: >60 mL/min (ref >60)
GLUCOSE: 213 mg/dL — AB (ref 65–99)
Potassium: 4.1 mmol/L (ref 3.5–5.1)
Sodium: 134 mmol/L — ABNORMAL LOW (ref 135–145)

## 2017-10-14 LAB — ECHOCARDIOGRAM COMPLETE
Height: 68 in
Weight: 4256 oz

## 2017-10-14 MED ORDER — KETOROLAC TROMETHAMINE 30 MG/ML IJ SOLN
60.0000 mg | Freq: Once | INTRAMUSCULAR | Status: DC
  Filled 2017-10-14: qty 2

## 2017-10-14 MED ORDER — TOPIRAMATE 25 MG PO TABS
50.0000 mg | ORAL_TABLET | Freq: Two times a day (BID) | ORAL | Status: DC | PRN
  Administered 2017-10-14 – 2017-10-15 (×2): 50 mg via ORAL
  Filled 2017-10-14 (×4): qty 2

## 2017-10-14 MED ORDER — KETOROLAC TROMETHAMINE 60 MG/2ML IM SOLN: 60.0000 mg | Freq: Once | INTRAMUSCULAR | Status: DC

## 2017-10-14 MED ORDER — SODIUM CHLORIDE 0.9 % IV SOLN
INTRAVENOUS | Status: DC
  Administered 2017-10-14 – 2017-10-15 (×3): via INTRAVENOUS

## 2017-10-14 MED ORDER — KETOROLAC TROMETHAMINE 60 MG/2ML IM SOLN
60.0000 mg | Freq: Once | INTRAMUSCULAR | Status: DC
  Filled 2017-10-14: qty 2

## 2017-10-14 MED ORDER — POTASSIUM CHLORIDE CRYS ER 20 MEQ PO TBCR
40.0000 meq | EXTENDED_RELEASE_TABLET | Freq: Two times a day (BID) | ORAL | Status: DC
  Administered 2017-10-14 – 2017-10-15 (×3): 40 meq via ORAL
  Filled 2017-10-14 (×3): qty 2

## 2017-10-14 NOTE — Progress Notes (Signed)
*  PRELIMINARY RESULTS* Echocardiogram 2D Echocardiogram has been performed.  Cristela Blue 10/14/2017, 9:22 AM

## 2017-10-14 NOTE — Progress Notes (Signed)
Inpatient Diabetes Program Recommendations  AACE/ADA: New Consensus Statement on Inpatient Glycemic Control (2015)  Target Ranges:  Prepandial:   less than 140 mg/dL      Peak postprandial:   less than 180 mg/dL (1-2 hours)      Critically ill patients:  140 - 180 mg/dL   Results for LAYLANI, PLANTZ (MRN [id number]) as of 10/14/2017 11:46  Ref. Range 10/13/2017 10:54 10/14/2017 05:19  Glucose Latest Ref Range: 65 - 99 mg/dL 037 (H) 048 (H)     Admit: AMS  History: 37 year old female with history of ADHD, anxiety, depression, obesity status post bariatric surgery 11 years ago and previous hypertension and diabetes that resolved after gastric bypass.   Home DM Meds: None  Current Insulin Orders: None       MD- Note patient has History of DM that apparently resolved after Gastric Bypass in 2008.  Lab glucose elevated X 2 days now.  Please consider placing orders for Novolog Sensitive Correction Scale/ SSI (0-9 units) TID AC + HS        --Will follow patient during hospitalization--  Ambrose Finland RN, MSN, CDE Diabetes Coordinator Inpatient Glycemic Control Team Team Pager: [phone] (8a-5p)

## 2017-10-14 NOTE — Progress Notes (Signed)
Pt still complaining of 10 out 10 headache and dizziness. Oxycodone given at 1550 per order.Pt is tearful per patient no relief from oxy. Dr. Katheren Shams notified, orders to give Topamax 50 mg PRN. Will continue to monitor.

## 2017-10-14 NOTE — Plan of Care (Signed)
  Progressing Education: Knowledge of General Education information will improve 10/14/2017 1505 - Progressing by Rigoberto Noel, RN Health Behavior/Discharge Planning: Ability to manage health-related needs will improve 10/14/2017 1505 - Progressing by Rigoberto Noel, RN Clinical Measurements: Will remain free from infection 10/14/2017 1505 - Progressing by Rigoberto Noel, RN Respiratory complications will improve 10/14/2017 1505 - Progressing by Rigoberto Noel, RN

## 2017-10-14 NOTE — Progress Notes (Signed)
PT Cancellation Note  Patient Details Name: Roseanna James MRN: [id number] DOB: 09/16/1981   Cancelled Treatment:    Reason Eval/Treat Not Completed: Patient at procedure or test/unavailable.  PT consult received.  Chart reviewed.  Pt currently not in room (off floor for testing).  Will re-attempt PT evaluation at a later date/time.  Hendricks Limes, PT 10/14/17, 3:18 PM [phone]

## 2017-10-14 NOTE — Progress Notes (Signed)
Pt refuses to ambulate. I educated but still refuses until she speaks to a different hospitalist. Dr. Sherryll Burger made aware. Will continue to monitor.

## 2017-10-14 NOTE — Progress Notes (Signed)
Pharmacist review of medications for QT Prolongation drugs.    None of the medications the patient was on prior to admission or during admission until this point are documented to have any significant QT prolonging effects.   Medications Prior to Admission  Medication Sig Dispense Refill  . amphetamine-dextroamphetamine (ADDERALL) 20 MG tablet Take 20 mg by mouth 3 (three) times daily. 6am noon and 6pm    . clonazePAM (KLONOPIN) 0.5 MG disintegrating tablet Take 1 tablet by mouth 2 (two) times daily.  0  . desvenlafaxine (PRISTIQ) 100 MG 24 hr tablet Take 100 mg by mouth daily.    . diphenhydrAMINE (BENADRYL) 25 mg capsule Take 25 mg by mouth every 6 (six) hours as needed.    Marland Kitchen tiZANidine (ZANAFLEX) 4 MG tablet Take 1 tablet by mouth every 4 (four) hours as needed.  0   Scheduled:  . amphetamine-dextroamphetamine  20 mg Oral 3 times per day  . aspirin EC  81 mg Oral Daily  . clonazePAM  0.5 mg Oral BID  . desvenlafaxine  50 mg Oral Daily  . enoxaparin (LOVENOX) injection  40 mg Subcutaneous Q12H  . magnesium oxide  400 mg Oral BID  . potassium chloride  40 mEq Oral BID   Infusions:  . sodium chloride 100 mL/hr at 10/13/17 1644   PRN: acetaminophen **OR** acetaminophen, oxyCODONE, tiZANidine Anti-infectives (From admission, onward)   None     Gerre Pebbles Karrington Mccravy, PharmD, MBA, [employer] Clinical Pharmacist [facility]

## 2017-10-14 NOTE — Progress Notes (Signed)
[employer] [facility] [place] [facility]   PATIENT NAME: Stephanie Levy    MR#:  [id number]  DATE OF BIRTH:  27-Jul-1981  SUBJECTIVE:  CHIEF COMPLAINT:   Chief Complaint  Patient presents with  . Loss of Consciousness    REVIEW OF SYSTEMS:  CONSTITUTIONAL: No fever, fatigue or weakness.  EYES: No blurred or double vision.  EARS, NOSE, AND THROAT: No tinnitus or ear pain.  RESPIRATORY: No cough, shortness of breath, wheezing or hemoptysis.  CARDIOVASCULAR: No chest pain, orthopnea, edema.  GASTROINTESTINAL: No nausea, vomiting, diarrhea or abdominal pain.  GENITOURINARY: No dysuria, hematuria.  ENDOCRINE: No polyuria, nocturia,  HEMATOLOGY: No anemia, easy bruising or bleeding SKIN: No rash or lesion. MUSCULOSKELETAL: No joint pain or arthritis.   NEUROLOGIC: No tingling, numbness, weakness.  PSYCHIATRY: No anxiety or depression.   ROS  DRUG ALLERGIES:   Allergies  Allergen Reactions  . Peanut-Containing Drug Products Anaphylaxis  . Amoxicillin Other (See Comments)    Gi distress  . Citrus Rash    VITALS:  Blood pressure 114/74, pulse 86, temperature 98.3 F (36.8 C), temperature source Oral, resp. rate 18, height 5\' 8"  (1.727 m), weight 120.7 kg (266 lb), last menstrual period 10/10/2017, SpO2 100 %.  PHYSICAL EXAMINATION:  GENERAL:  37 y.o.-year-old patient lying in the bed with no acute distress.  EYES: Pupils equal, round, reactive to light and accommodation. No scleral icterus. Extraocular muscles intact.  HEENT: Head atraumatic, normocephalic. Oropharynx and nasopharynx clear.  NECK:  Supple, no jugular venous distention. No thyroid enlargement, no tenderness.  LUNGS: Normal breath sounds bilaterally, no wheezing, rales,rhonchi or crepitation. No use of accessory muscles of respiration.  CARDIOVASCULAR: S1, S2 normal. No murmurs, rubs, or gallops.  ABDOMEN: Soft, nontender, nondistended. Bowel sounds present. No organomegaly or mass.  EXTREMITIES: No  pedal edema, cyanosis, or clubbing.  NEUROLOGIC: Cranial nerves II through XII are intact. Muscle strength 5/5 in all extremities. Sensation intact. Gait not checked.  PSYCHIATRIC: The patient is alert and oriented x 3.  SKIN: No obvious rash, lesion, or ulcer.   Physical Exam LABORATORY PANEL:   CBC Recent Labs  Lab 10/14/17 0519  WBC 6.0  HGB 9.6*  HCT 29.9*  PLT 226   ------------------------------------------------------------------------------------------------------------------  Chemistries  Recent Labs  Lab 10/13/17 1054 10/14/17 0519  NA 132* 134*  K 3.7 4.1  CL 98* 107  CO2 21* 20*  GLUCOSE 207* 213*  BUN 10 12  CREATININE 0.90 0.67  CALCIUM 8.7* 8.1*  MG 1.5*  --   AST 290* 229*  ALT 110* 121*  ALKPHOS 91 75  BILITOT 1.4* 0.2*   ------------------------------------------------------------------------------------------------------------------  Cardiac Enzymes Recent Labs  Lab 10/13/17 1420 10/13/17 1858  TROPONINI 0.10* 0.06*   ------------------------------------------------------------------------------------------------------------------  RADIOLOGY:  Dg Chest 2 View  Result Date: 10/13/2017 CLINICAL DATA:  Pt in via [facility] from home with complaints of 3 syncopal episodes since yesterday. Pt unable to recall if she has hit her head. Pt appears drowsy, vitals WDL. Loss of appetite and nausea x 2 days. Denies PMH. Never a smoker EXAM: CHEST  2 VIEW COMPARISON:  03/01/2015 FINDINGS: Shallow lung inflation. Heart size is within normal limits accounting for position. There are no focal consolidations or pleural effusions. No pulmonary edema. IMPRESSION: No active cardiopulmonary disease. Electronically Signed   By: Norva Pavlov M.D.   On: 10/13/2017 11:58   Ct Head Wo Contrast  Result Date: 10/13/2017 CLINICAL DATA:  Several syncopal episodes.  Questionable fall EXAM: CT HEAD  WITHOUT CONTRAST TECHNIQUE: Contiguous axial images were obtained from the  base of the skull through the vertex without intravenous contrast. COMPARISON:  None. FINDINGS: Brain: The ventricles are normal in size and configuration. There is no intracranial mass, hemorrhage, extra-axial fluid collection, or midline shift. Gray-white compartments appear normal. No acute infarct evident. Vascular: There is no appreciable hyperdense vessel. There is no appreciable vascular calcification. Skull: The bony calvarium appears intact. Sinuses/Orbits: Visualized paranasal sinuses are clear. Visualized orbits appear symmetric bilaterally. Other: Visualized mastoid air cells are clear. IMPRESSION: Study within normal limits. Electronically Signed   By: Bretta Bang III M.D.   On: 10/13/2017 14:08   Mr Brain Wo Contrast  Result Date: 10/13/2017 CLINICAL DATA:  37 y/o F; multiple falls and altered level of consciousness. EXAM: MRI HEAD WITHOUT CONTRAST TECHNIQUE: Multiplanar, multiecho pulse sequences of the brain and surrounding structures were obtained without intravenous contrast. COMPARISON:  10/13/2016 head CT.  10/13/2016 carotid ultrasound. FINDINGS: Brain: Well-circumscribed oval T1 hyperintense and T2 hypointense lesion within the mid pituitary gland measuring 8 x 17 x 6 mm (AP x ML x CC series 2, image 15 and series 10, image 23). On CT this lesion demonstrates low to intermediate attenuation. No reduced diffusion to suggest acute or early subacute infarction. No abnormal T2 signal abnormality of the brain. No structural abnormality of the brain. No focal mass effect or brain parenchymal hemorrhage. No effacement of basilar cisterns, hydrocephalus, or extra-axial collection. Vascular: Normal flow voids. Skull and upper cervical spine: Normal marrow signal. Sinuses/Orbits: Negative. Other: None. IMPRESSION: 1. Well-circumscribed lesion within mid pituitary with signal characteristics either representing subacute hematoma or proteinaceous cyst. No mass effect on the suprasellar cistern or  cisternal structures. 2. Otherwise normal MRI of the brain. These results will be called to the ordering clinician or representative by the Radiologist Assistant, and communication documented in the PACS or zVision Dashboard. Electronically Signed   By: Mitzi Hansen M.D.   On: 10/13/2017 23:04   Ct Abdomen Pelvis W Contrast  Result Date: 10/13/2017 CLINICAL DATA:  Abdominal pain. Clinical suspicion for diverticulitis. EXAM: CT ABDOMEN AND PELVIS WITH CONTRAST TECHNIQUE: Multidetector CT imaging of the abdomen and pelvis was performed using the standard protocol following bolus administration of intravenous contrast. CONTRAST:  ISOVUE-300 IOPAMIDOL (ISOVUE-300) INJECTION 61% COMPARISON:  03/05/2015 FINDINGS: Lower Chest: No acute findings. Hepatobiliary: No hepatic masses identified. Prior cholecystectomy. No evidence of biliary obstruction. Pancreas:  No mass or inflammatory changes. Spleen: Within normal limits in size and appearance. Adrenals/Urinary Tract: No masses identified. No evidence of hydronephrosis. Stomach/Bowel: Previous gastric bypass surgery. No evidence of obstruction, inflammatory process or abnormal fluid collections. Normal appendix visualized. Vascular/Lymphatic: No pathologically enlarged lymph nodes. No abdominal aortic aneurysm. Reproductive:  No mass or other significant abnormality. Other:  None. Musculoskeletal: No suspicious bone lesions identified. Lumbar spine fusion hardware at L4-5. IMPRESSION: Previous gastric bypass surgery. No acute findings or other significant abnormality. Electronically Signed   By: Myles Rosenthal M.D.   On: 10/13/2017 14:13   US Carotid Bilateral  Result Date: 10/13/2017 CLINICAL DATA:  Syncope and blurred vision. EXAM: BILATERAL CAROTID DUPLEX ULTRASOUND TECHNIQUE: Wallace Cullens scale imaging, color Doppler and duplex ultrasound were performed of bilateral carotid and vertebral arteries in the neck. COMPARISON:  None. FINDINGS: Criteria:  Quantification of carotid stenosis is based on velocity parameters that correlate the residual internal carotid diameter with NASCET-based stenosis levels, using the diameter of the distal internal carotid lumen as the denominator for stenosis measurement. The  following velocity measurements were obtained: RIGHT ICA:  118/47 cm/sec CCA:  139/24 cm/sec SYSTOLIC ICA/CCA RATIO:  0.9 DIASTOLIC ICA/CCA RATIO:  2.0 ECA:  120 cm/sec LEFT ICA:  89/37 cm/sec CCA:  134/22 cm/sec SYSTOLIC ICA/CCA RATIO:  0.7 DIASTOLIC ICA/CCA RATIO:  1.7 ECA:  94 cm/sec RIGHT CAROTID ARTERY: No focal plaque identified. Velocities and waveforms are within normal limits. No evidence of carotid stenosis. RIGHT VERTEBRAL ARTERY: Antegrade flow with normal waveform and velocity. LEFT CAROTID ARTERY: No focal plaque identified. Velocities and waveforms are within normal limits. No evidence of carotid stenosis. LEFT VERTEBRAL ARTERY: Antegrade flow with normal waveform and velocity. No evidence by carotid duplex ultrasound to suggest arterial dissection. IMPRESSION: Normal carotid duplex ultrasound demonstrating no evidence carotid stenosis or dissection. Vertebral arteries show antegrade flow bilaterally. Electronically Signed   By: Irish Lack M.D.   On: 10/13/2017 15:43    ASSESSMENT AND PLAN:  37 y.o. female  with history of depression presents with not feeling well, had some drinks on Tuesday night 3 or 4 shots, paresthesias involving fingertips, poor recall the following day after drinking, had taken some Benadryl for allergies, partner could not understand her speech very well, poor p.o. intake 3 days, hurting all over from recent fall - stood up and felt lightheaded/dizzy/LOC for a few seconds, recurrent syncopal event while trying to take a shower, repeat syncope while urinating in the emergency room, found to have a low magnesium, prolonged QTC, borderline elevated troponins, elevated liver function test, and admitted for recurrent  syncopal events  1 acute syncopal episodes Exact etiology is unknown Noted mild dehydration given ketones in urine, prolonged QTC on EKG, elevated troponins, poor p.o. intake over the last several days Condition is stable currently Continue IV fluids for rehydration, neurochecks per routine, orthostatics were negative, echocardiogram noted for stage I diastolic dysfunction, cardiology input appreciated-await further recommendations, CT abdomen was negative, CT head negative, urine drug screen negative, carotid Dopplers negative, MRI of the brain noted for pituitary abnormality-neurosurgery consulted but is currently unavailable-may follow-up as an outpatient as this is deemed not to be related to syncopal events, fall precautions, PT/OT to evaluate/treat, ambulate with assistance  2 acute Prolonged QTC Cardiac enzymes and consistent with acute coronary syndrome, continue telemetry, decreased dose of Pristiq from 100 mg down to 50 mg, echocardiogram noted above, and await further cardiology recommendations  3 acute elevated LFTs Suspect related to underlying alcohol abuse-patient denies this Follow-up on acute hepatitis panel, check right upper quadrant ultrasound for further liver evaluation, and repeat LFTs in the morning  4 acute hypomagnesemia and hypokalemia  Repleted  Check BMP with magnesium in the morning   5 chronic depression Stable  Lower dose of Pristiq from 100 mg down to 50 mg secondary to QTC prolongation as stated above  6 acute elevated troponin Inconsistent with acute coronary syndrome Cardiology following-await further recommendations, echocardiogram noted above  Full code Condition stable Prognosis fair Disposition Home on tomorrow barring any complications  All the records are reviewed and case discussed with Care Management/Social Workerr. Management plans discussed with the patient, family and they are in agreement.  CODE STATUS:   TOTAL TIME TAKING CARE OF  THIS PATIENT: 445 minutes.     POSSIBLE D/C IN 1 DAYS, DEPENDING ON CLINICAL CONDITION.   Evelena Asa Elishah Ashmore M.D on 10/14/2017   Between 7am to 6pm - Pager - [phone]  After 6pm go to [email or web address] - Social research officer, government  [employer]  [phone]  CC: Primary care physician; System, Pcp Not In  Note: This dictation was prepared with Dragon dictation along with smaller phrase technology. Any transcriptional errors that result from this process are unintentional.

## 2017-10-15 MED ORDER — METOCLOPRAMIDE HCL 5 MG/ML IJ SOLN
5.0000 mg | Freq: Once | INTRAMUSCULAR | Status: AC
  Administered 2017-10-15: 5 mg via INTRAVENOUS
  Filled 2017-10-15: qty 2

## 2017-10-15 MED ORDER — DESVENLAFAXINE SUCCINATE ER 50 MG PO TB24
50.0000 mg | ORAL_TABLET | Freq: Once | ORAL | Status: AC
  Administered 2017-10-15: 50 mg via ORAL
  Filled 2017-10-15: qty 1

## 2017-10-15 MED ORDER — BUTALBITAL-APAP-CAFFEINE 50-325-40 MG PO TABS
1.0000 | ORAL_TABLET | Freq: Four times a day (QID) | ORAL | Status: DC | PRN
  Filled 2017-10-15: qty 1

## 2017-10-15 MED ORDER — KETOROLAC TROMETHAMINE 30 MG/ML IJ SOLN
30.0000 mg | Freq: Once | INTRAMUSCULAR | Status: AC
  Administered 2017-10-15: 30 mg via INTRAVENOUS

## 2017-10-15 NOTE — Evaluation (Signed)
Physical Therapy Evaluation Patient Details Name: Stephanie Levy MRN: [id number] DOB: [birth year] Today's Date: 10/15/2017   History of Present Illness  Pt admitted following syncope and unexplained blackouts.  PMH includes depression, abdominal surgery and spinal fusion.  Clinical Impression  Pt is a 37 year old female who is generally active and lives in a one story home with her spouse.  Pt is able to perform bed mobility, transfers and ambulate 240 ft independently.  Pt reports 0/10 pain and sensation intact.  She does not require an AD for assistance with balance.  Pt is not appropriate for PT at this time.    Follow Up Recommendations No PT follow up    Equipment Recommendations       Recommendations for Other Services       Precautions / Restrictions        Mobility  Bed Mobility Overal bed mobility: Independent                Transfers Overall transfer level: Independent               General transfer comment: PT maintained close stance in proximity to pt due to recent dx but pt was able to perform transfers independently.  Ambulation/Gait Ambulation/Gait assistance: Independent Ambulation Distance (Feet): 240 Feet   Gait Pattern/deviations: WFL(Within Functional Limits)   Gait velocity interpretation: at or above normal speed for age/gender    Stairs            Wheelchair Mobility    Modified Rankin (Stroke Patients Only)       Balance Overall balance assessment: Independent                                           Pertinent Vitals/Pain Pain Assessment: No/denies pain    Home Living Family/patient expects to be discharged to:: Private residence Living Arrangements: Spouse/significant other Available Help at Discharge: Family Type of Home: House Home Access: Stairs to enter Entrance Stairs-Rails: None Secretary/administrator of Steps: 2 Home Layout: One level Home Equipment: Environmental consultant - 2 wheels      Prior  Function Level of Independence: Independent               Hand Dominance        Extremity/Trunk Assessment   Upper Extremity Assessment Upper Extremity Assessment: Overall WFL for tasks assessed    Lower Extremity Assessment Lower Extremity Assessment: Overall WFL for tasks assessed    Cervical / Trunk Assessment Cervical / Trunk Assessment: Normal  Communication   Communication: No difficulties  Cognition Arousal/Alertness: Awake/alert Behavior During Therapy: WFL for tasks assessed/performed Overall Cognitive Status: Within Functional Limits for tasks assessed                                        General Comments      Exercises     Assessment/Plan    PT Assessment Patent does not need any further PT services  PT Problem List         PT Treatment Interventions      PT Goals (Current goals can be found in the Care Plan section)  Acute Rehab PT Goals Patient Stated Goal: To return home and continue to seek answers concerning her medical condition. PT Goal Formulation: With patient Time  For Goal Achievement: 10/29/17 Potential to Achieve Goals: Good    Frequency     Barriers to discharge        Co-evaluation               AM-PAC PT "6 Clicks" Daily Activity  Outcome Measure Difficulty turning over in bed (including adjusting bedclothes, sheets and blankets)?: None Difficulty moving from lying on back to sitting on the side of the bed? : None Difficulty sitting down on and standing up from a chair with arms (e.g., wheelchair, bedside commode, etc,.)?: None Help needed moving to and from a bed to chair (including a wheelchair)?: None Help needed walking in hospital room?: None Help needed climbing 3-5 steps with a railing? : None 6 Click Score: 24    End of Session Equipment Utilized During Treatment: Gait belt Activity Tolerance: Patient tolerated treatment well Patient left: in bed;with call bell/phone within reach    PT Visit Diagnosis: History of falling (Z91.81)    Time: 1050-1010 PT Time Calculation (min) (ACUTE ONLY): 1400 min   Charges:   PT Evaluation $PT Eval Low Complexity: 1 Low     PT G Codes:   PT G-Codes **NOT FOR INPATIENT CLASS** Functional Assessment Tool Used: AM-PAC 6 Clicks Basic Mobility    Stephanie Levy, PT, DPT   Stephanie Levy 10/15/2017, 11:57 AM

## 2017-10-15 NOTE — Progress Notes (Signed)
Went over discharge instructions with the patient and family about the follow-up appointment. Discontinue peripheral IV and telemetry monitor. Patient is wanting information on her imaging including her MRI, Echo and CT head, gave patient an information for them to come Monday at medical records for those information. RN will continue to monitor.
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
## 2017-10-15 NOTE — Progress Notes (Signed)
Pt has been c/o 10/10 headache throughout the night. Multiple pain relief given with little to no effect. Pt was taking Pristiq 100 mg daily and had missed her dose on the day of admission and then her dose was reduced to 50 mg on Friday a.m. MD Sheryle Hail made aware that pt may be having headaches related to withdrawal from regular dosage. 50 mg one time Pristiq ordered. Pt educated on risks and benefits. I will pass on to day shift that pt was given additional dose of pristiq. Fioricet also ordered but contains acetaminophen so pt declined due to recent concerns over tylenol levels.

## 2017-10-15 NOTE — Progress Notes (Addendum)
[facility]    Jeniya Kuehnle was admitted to the Hospital on 10/13/2017 and Discharged  10/15/2017 and should be excused from work/school   for 6 days starting 10/13/2017 , may return to work/school without any restrictions.  Call Hilda Lias MD, [facility]  [phone] with questions.  Houston Siren M.D on 10/15/2017,at 11:21 AM

## 2017-10-16 LAB — HEPATITIS PANEL, ACUTE
Hep A IgM: NEGATIVE
Hep B C IgM: NEGATIVE
Hepatitis B Surface Ag: NEGATIVE

## 2017-10-16 LAB — HIV ANTIBODY (ROUTINE TESTING W REFLEX): HIV SCREEN 4TH GENERATION: NONREACTIVE

## 2017-10-17 ENCOUNTER — Telehealth: Payer: Self-pay | Admitting: Cardiovascular Disease

## 2017-10-17 NOTE — Telephone Encounter (Signed)
Per Dr. Kirke Corin: "This patient is hospitalized with syncope and QT prolongation. Please arrange for a 2-week outpatient monitor (Zio patch is fine) "  S/w pt who reports continued 8 out 10 chest pain since admission to [facility]. As it is constant, nothing she does improves her pain level.  This pain is not new since discharge.  Pt refuses zio monitor as she "did not appreciate the level of care I received from the hospital".  She reports she will follow up with her own PCP to see what is recommended. Reviewed Ss that would require immediate attention in an ER setting.  Advised to call back if we may ever be of any assistance.  Pt verbalized understanding.

## 2017-10-17 NOTE — Telephone Encounter (Signed)
Ok. Thanks!

## 2018-12-17 IMAGING — CT CT HEAD W/O CM
3 series · 16 of 47 positions shown, 19 images · non-contrast
Comparison: None.

CLINICAL DATA: Several syncopal episodes.  Questionable fall

EXAM:
CT HEAD WITHOUT CONTRAST
TECHNIQUE: Contiguous axial images were obtained from the base of the skull
through the vertex without intravenous contrast.

[Series 2: head wo · axial · 0.40mm/px · z∈[+391,+516]mm · 10 of 30 slices shown, 13 images]
[im 3/30  brain]
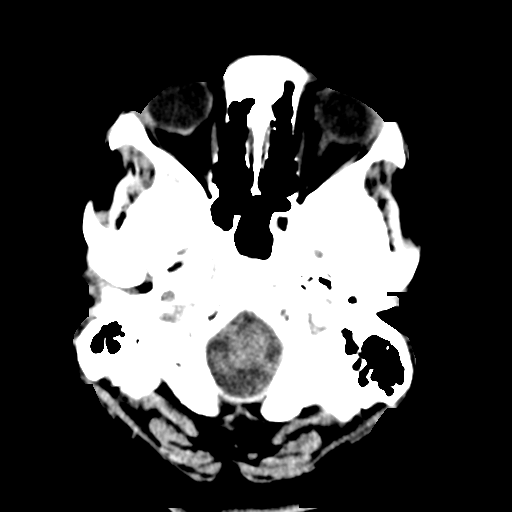
[im 3/30  bone]
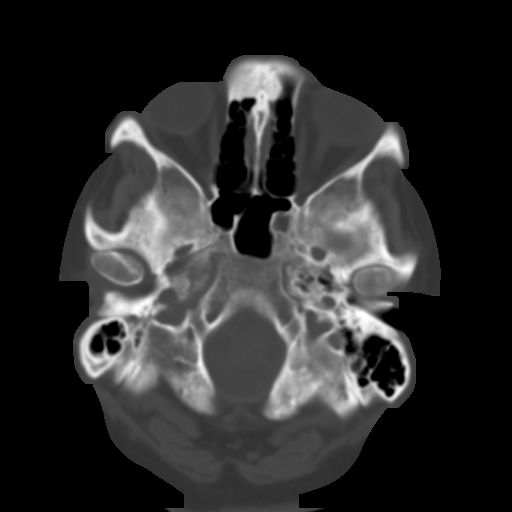
[im 6/30  brain]
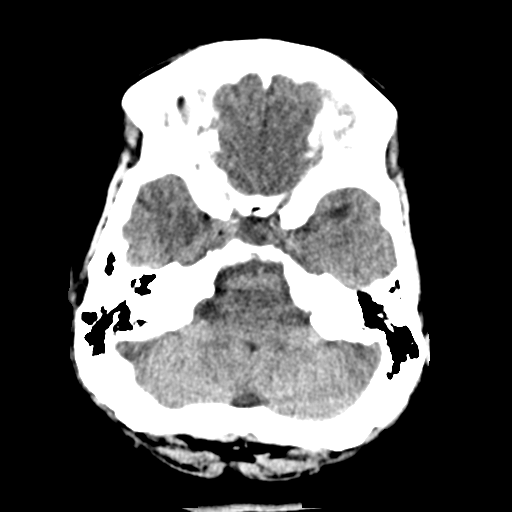
[im 9/30  brain]
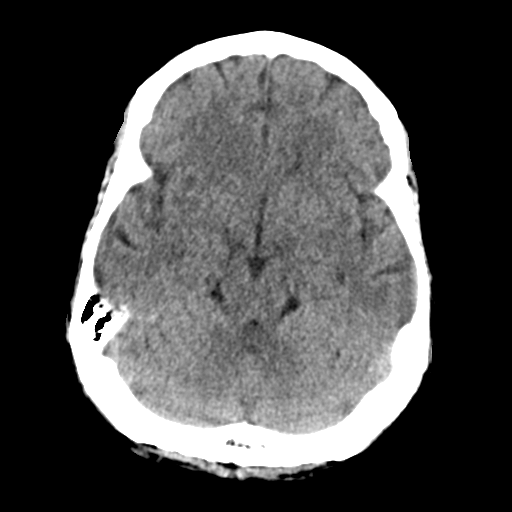
[im 11/30  brain]
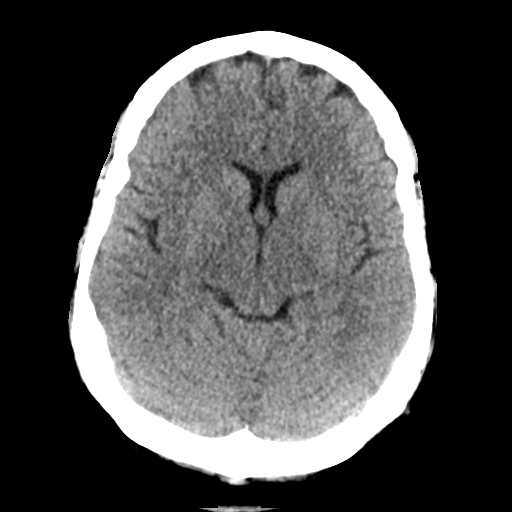
[im 14/30  brain]
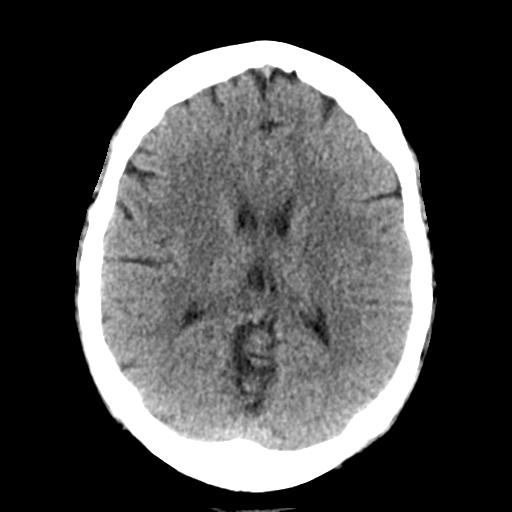
[im 14/30  bone]
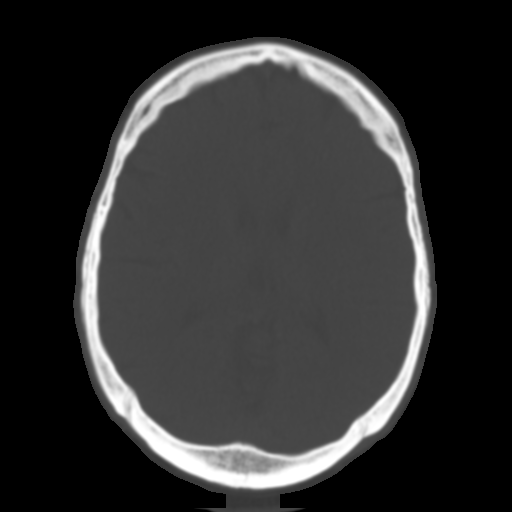
[im 17/30  brain]
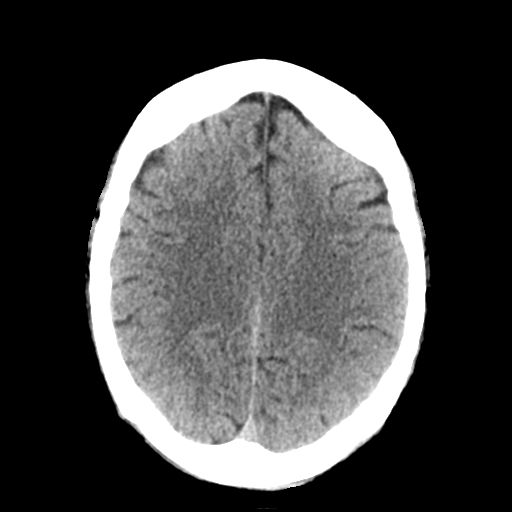
[im 20/30  brain]
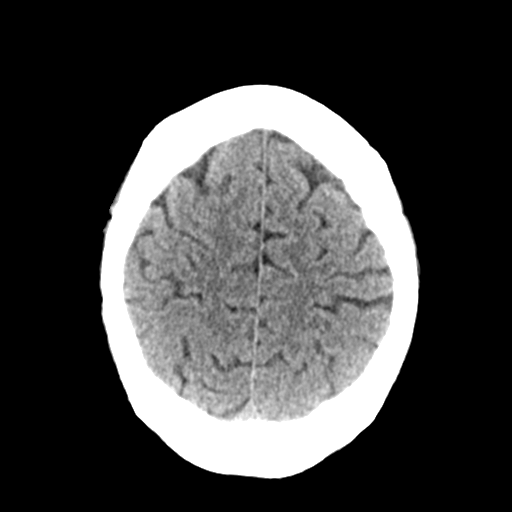
[im 23/30  brain]
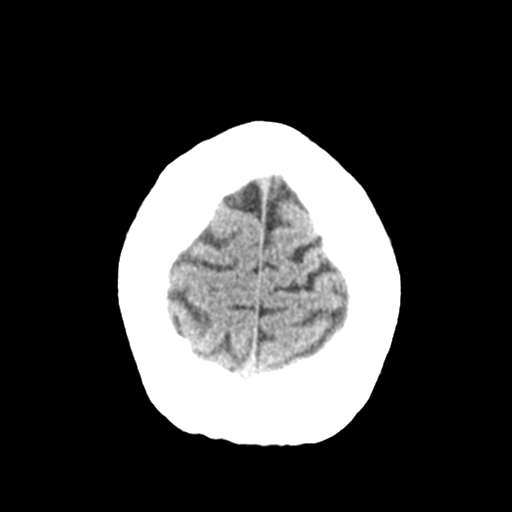
[im 25/30  brain]
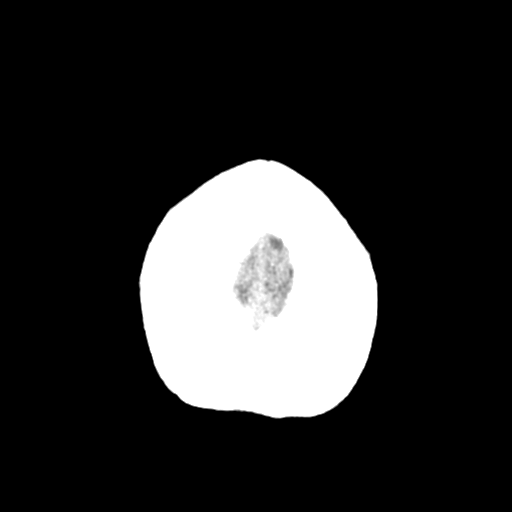
[im 25/30  bone]
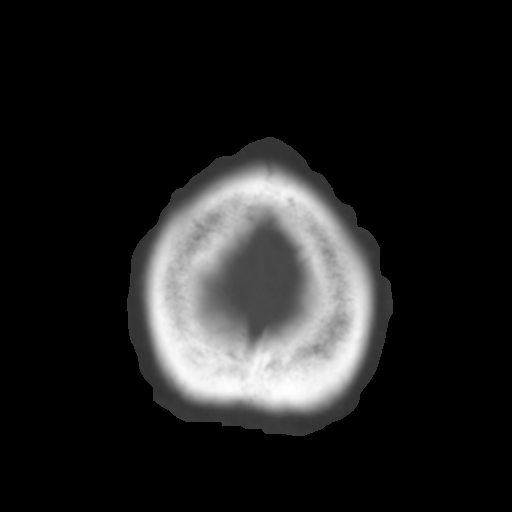
[im 28/30  brain]
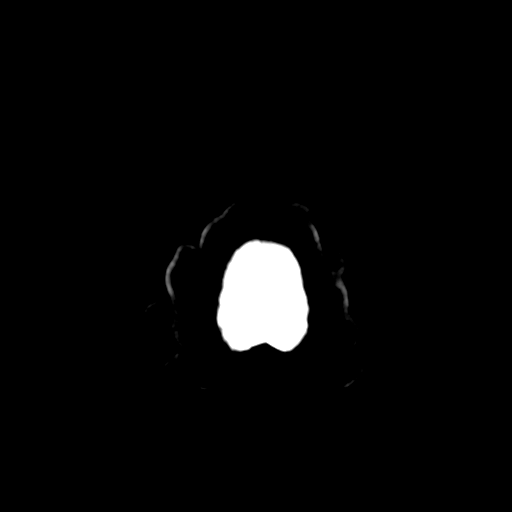

[Series 4: coronal soft tissue · coronal · 0.31mm/px · 3 of 69 slices shown]
[im 23/69  brain]
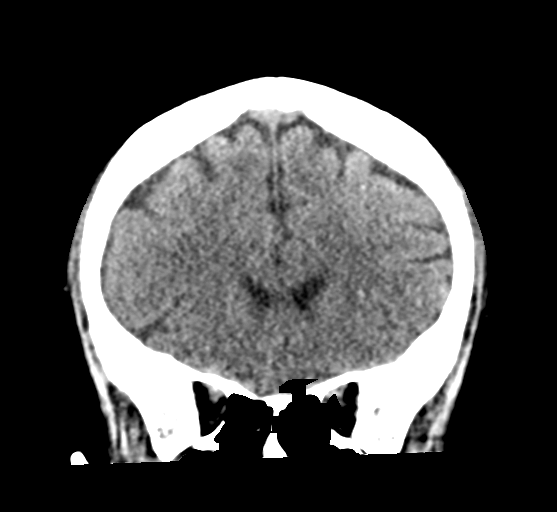
[im 31/69  brain]
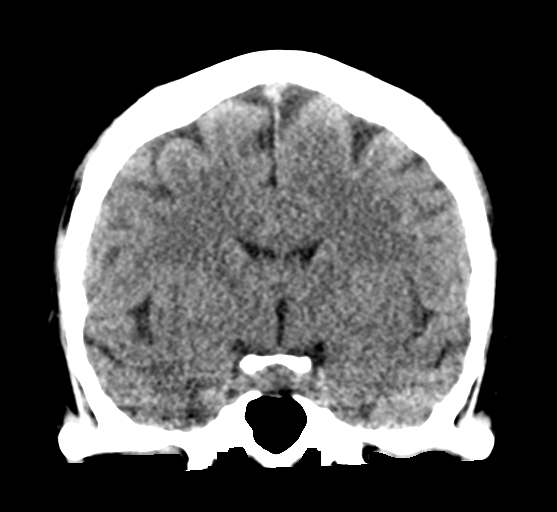
[im 38/69  brain]
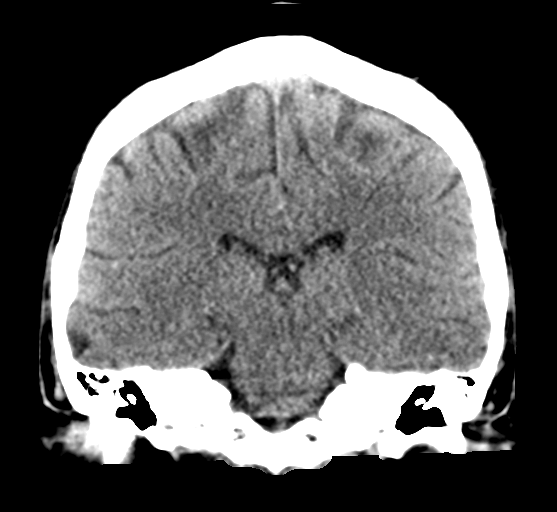

[Series 5: sagittal soft tissue · sagittal · 0.34mm/px · 3 of 54 slices shown]
[im 18/54  brain]
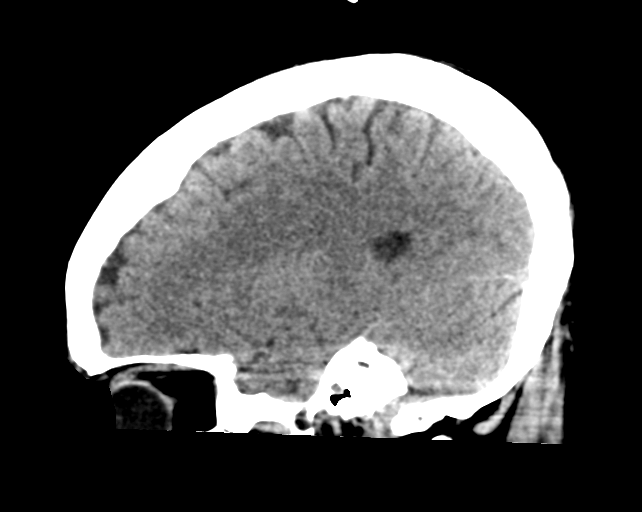
[im 27/54  brain]
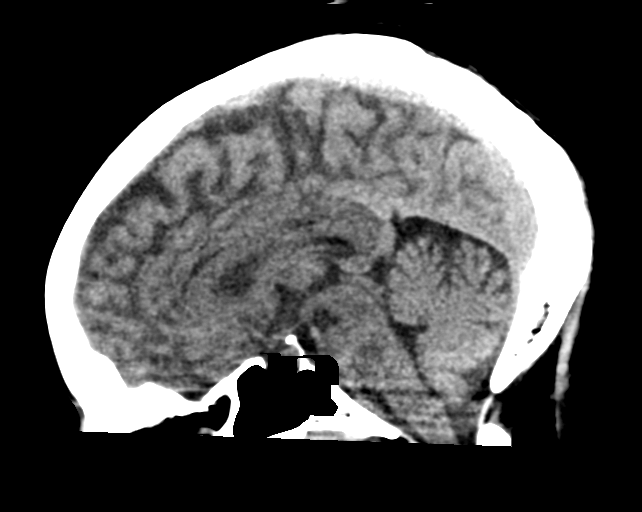
[im 36/54  brain]
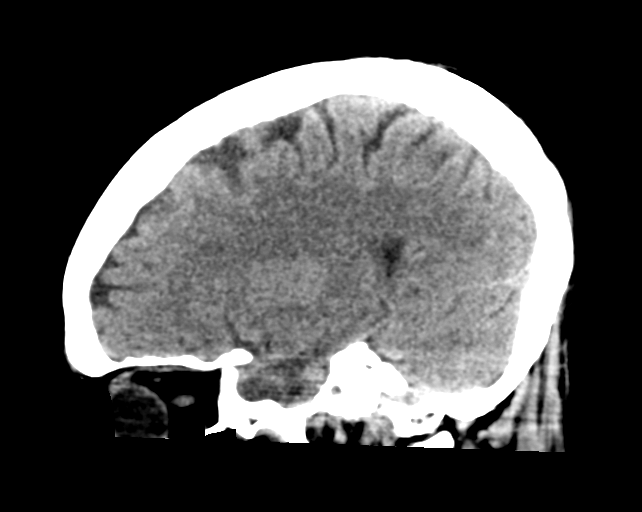

[16 of 47 positions shown; findings below may reference images not displayed]

FINDINGS: Brain: The ventricles are normal in size and configuration. There is
no intracranial mass, hemorrhage, extra-axial fluid collection, or
midline shift. Gray-white compartments appear normal. No acute
infarct evident.

Vascular: There is no appreciable hyperdense vessel. There is no
appreciable vascular calcification.

Skull: The bony calvarium appears intact.

Sinuses/Orbits: Visualized paranasal sinuses are clear. Visualized
orbits appear symmetric bilaterally.

Other: Visualized mastoid air cells are clear.
IMPRESSION: Study within normal limits.

## 2018-12-24 ENCOUNTER — Inpatient Hospital Stay
Admission: EM | Admit: 2018-12-24 | Discharge: 2018-12-30 | DRG: 438 | Disposition: A | Discharge status: Home / Self Care | Payer: BLUE CROSS/BLUE SHIELD | Attending: Internal Medicine | Admitting: Internal Medicine

## 2018-12-24 ENCOUNTER — Other Ambulatory Visit: Payer: Self-pay

## 2018-12-24 ENCOUNTER — Encounter: Payer: Self-pay | Admitting: Emergency Medicine

## 2018-12-24 ENCOUNTER — Emergency Department: Payer: BLUE CROSS/BLUE SHIELD

## 2018-12-24 DIAGNOSIS — K852 Alcohol induced acute pancreatitis without necrosis or infection: Principal | ICD-10-CM | POA: Diagnosis present

## 2018-12-24 DIAGNOSIS — E876 Hypokalemia: Secondary | ICD-10-CM | POA: Diagnosis present

## 2018-12-24 DIAGNOSIS — Z825 Family history of asthma and other chronic lower respiratory diseases: Secondary | ICD-10-CM | POA: Diagnosis not present

## 2018-12-24 DIAGNOSIS — F909 Attention-deficit hyperactivity disorder, unspecified type: Secondary | ICD-10-CM | POA: Diagnosis present

## 2018-12-24 DIAGNOSIS — F331 Major depressive disorder, recurrent, moderate: Secondary | ICD-10-CM | POA: Diagnosis present

## 2018-12-24 DIAGNOSIS — K760 Fatty (change of) liver, not elsewhere classified: Secondary | ICD-10-CM | POA: Diagnosis present

## 2018-12-24 DIAGNOSIS — F419 Anxiety disorder, unspecified: Secondary | ICD-10-CM | POA: Diagnosis present

## 2018-12-24 DIAGNOSIS — Z8249 Family history of ischemic heart disease and other diseases of the circulatory system: Secondary | ICD-10-CM | POA: Diagnosis not present

## 2018-12-24 DIAGNOSIS — F1010 Alcohol abuse, uncomplicated: Secondary | ICD-10-CM | POA: Diagnosis present

## 2018-12-24 DIAGNOSIS — Z818 Family history of other mental and behavioral disorders: Secondary | ICD-10-CM | POA: Diagnosis not present

## 2018-12-24 DIAGNOSIS — R0902 Hypoxemia: Secondary | ICD-10-CM

## 2018-12-24 DIAGNOSIS — Z91018 Allergy to other foods: Secondary | ICD-10-CM

## 2018-12-24 DIAGNOSIS — I10 Essential (primary) hypertension: Secondary | ICD-10-CM | POA: Diagnosis present

## 2018-12-24 DIAGNOSIS — Z23 Encounter for immunization: Secondary | ICD-10-CM

## 2018-12-24 DIAGNOSIS — Z833 Family history of diabetes mellitus: Secondary | ICD-10-CM | POA: Diagnosis not present

## 2018-12-24 DIAGNOSIS — Z9884 Bariatric surgery status: Secondary | ICD-10-CM

## 2018-12-24 DIAGNOSIS — E860 Dehydration: Secondary | ICD-10-CM | POA: Diagnosis present

## 2018-12-24 DIAGNOSIS — E119 Type 2 diabetes mellitus without complications: Secondary | ICD-10-CM

## 2018-12-24 DIAGNOSIS — E871 Hypo-osmolality and hyponatremia: Secondary | ICD-10-CM | POA: Diagnosis present

## 2018-12-24 DIAGNOSIS — Z813 Family history of other psychoactive substance abuse and dependence: Secondary | ICD-10-CM

## 2018-12-24 DIAGNOSIS — J9601 Acute respiratory failure with hypoxia: Secondary | ICD-10-CM | POA: Diagnosis not present

## 2018-12-24 DIAGNOSIS — Z91010 Allergy to peanuts: Secondary | ICD-10-CM

## 2018-12-24 DIAGNOSIS — G4700 Insomnia, unspecified: Secondary | ICD-10-CM | POA: Diagnosis present

## 2018-12-24 DIAGNOSIS — F10239 Alcohol dependence with withdrawal, unspecified: Secondary | ICD-10-CM | POA: Diagnosis present

## 2018-12-24 DIAGNOSIS — Z811 Family history of alcohol abuse and dependence: Secondary | ICD-10-CM | POA: Diagnosis not present

## 2018-12-24 DIAGNOSIS — Z881 Allergy status to other antibiotic agents status: Secondary | ICD-10-CM | POA: Diagnosis not present

## 2018-12-24 DIAGNOSIS — G8929 Other chronic pain: Secondary | ICD-10-CM | POA: Diagnosis present

## 2018-12-24 LAB — COMPREHENSIVE METABOLIC PANEL
ALK PHOS: 130 U/L — AB (ref 38–126)
ALT: 115 U/L — ABNORMAL HIGH (ref 0–44)
ANION GAP: 13 (ref 5–15)
AST: 320 U/L — ABNORMAL HIGH (ref 15–41)
Albumin: 3.5 g/dL (ref 3.5–5.0)
BUN: 7 mg/dL (ref 6–20)
CALCIUM: 8.7 mg/dL — AB (ref 8.9–10.3)
CO2: 23 mmol/L (ref 22–32)
Chloride: 94 mmol/L — ABNORMAL LOW (ref 98–111)
Creatinine, Ser: 0.49 mg/dL (ref 0.44–1.00)
Glucose, Bld: 242 mg/dL — ABNORMAL HIGH (ref 70–99)
Potassium: 3.2 mmol/L — ABNORMAL LOW (ref 3.5–5.1)
Sodium: 130 mmol/L — ABNORMAL LOW (ref 135–145)
Total Bilirubin: 1.7 mg/dL — ABNORMAL HIGH (ref 0.3–1.2)
Total Protein: 6.8 g/dL (ref 6.5–8.1)

## 2018-12-24 LAB — URINALYSIS, COMPLETE (UACMP) WITH MICROSCOPIC
Bacteria, UA: NONE SEEN
Bilirubin Urine: NEGATIVE
Hgb urine dipstick: NEGATIVE
Ketones, ur: 20 mg/dL — AB
LEUKOCYTE UA: NEGATIVE
Nitrite: NEGATIVE
PH: 5 (ref 5.0–8.0)
PROTEIN: 100 mg/dL — AB
Specific Gravity, Urine: 1.041 — ABNORMAL HIGH (ref 1.005–1.030)

## 2018-12-24 LAB — CBC
HCT: 38.1 % (ref 36.0–46.0)
Hemoglobin: 12.6 g/dL (ref 12.0–15.0)
MCH: 29.8 pg (ref 26.0–34.0)
MCHC: 33.1 g/dL (ref 30.0–36.0)
MCV: 90.1 fL (ref 80.0–100.0)
PLATELETS: 242 10*3/uL (ref 150–400)
RBC: 4.23 MIL/uL (ref 3.87–5.11)
RDW: 16.2 % — AB (ref 11.5–15.5)
WBC: 9.1 10*3/uL (ref 4.0–10.5)
nRBC: 0 % (ref 0.0–0.2)

## 2018-12-24 LAB — LIPASE, BLOOD: LIPASE: 677 U/L — AB (ref 11–51)

## 2018-12-24 MED ORDER — SODIUM CHLORIDE 0.9% FLUSH
3.0000 mL | Freq: Once | INTRAVENOUS | Status: AC
  Administered 2018-12-24: 3 mL via INTRAVENOUS

## 2018-12-24 MED ORDER — ONDANSETRON HCL 4 MG/2ML IJ SOLN
4.0000 mg | Freq: Once | INTRAMUSCULAR | Status: AC
  Administered 2018-12-24: 4 mg via INTRAVENOUS
  Filled 2018-12-24: qty 2

## 2018-12-24 MED ORDER — HYDROMORPHONE HCL 1 MG/ML IJ SOLN: 0.5000 mg | Freq: Once | INTRAMUSCULAR | Status: DC

## 2018-12-24 MED ORDER — HYDROMORPHONE HCL 1 MG/ML IJ SOLN
1.0000 mg | Freq: Once | INTRAMUSCULAR | Status: AC
  Administered 2018-12-24: 1 mg via INTRAVENOUS
  Filled 2018-12-24: qty 1

## 2018-12-24 MED ORDER — SODIUM CHLORIDE 0.9 % IV SOLN
Freq: Once | INTRAVENOUS | Status: AC
  Administered 2018-12-24: 23:00:00 via INTRAVENOUS

## 2018-12-24 NOTE — ED Notes (Signed)
Patient reports a history of pancreatitis and reports abdominal pain.  Patient reports she drinks a pint daily, last drink was 2 days ago.

## 2018-12-24 NOTE — ED Triage Notes (Signed)
Pt arrives ambulatory with steady gait to triage with c/o N/V x 2 days. Pt states that she has a hx of pancreatitis and states that this feels like the same. Pt states that she already took some phenergan around 1900 but feels like it did not stay down.Pt is in NAD.
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
## 2018-12-25 DIAGNOSIS — K852 Alcohol induced acute pancreatitis without necrosis or infection: Principal | ICD-10-CM

## 2018-12-25 DIAGNOSIS — F331 Major depressive disorder, recurrent, moderate: Secondary | ICD-10-CM

## 2018-12-25 DIAGNOSIS — F419 Anxiety disorder, unspecified: Secondary | ICD-10-CM

## 2018-12-25 DIAGNOSIS — F909 Attention-deficit hyperactivity disorder, unspecified type: Secondary | ICD-10-CM

## 2018-12-25 DIAGNOSIS — F1010 Alcohol abuse, uncomplicated: Secondary | ICD-10-CM

## 2018-12-25 LAB — CBC
HEMATOCRIT: 41.6 % (ref 36.0–46.0)
Hemoglobin: 13.3 g/dL (ref 12.0–15.0)
MCH: 29.6 pg (ref 26.0–34.0)
MCHC: 32 g/dL (ref 30.0–36.0)
MCV: 92.4 fL (ref 80.0–100.0)
Platelets: 258 10*3/uL (ref 150–400)
RBC: 4.5 MIL/uL (ref 3.87–5.11)
RDW: 16.3 % — ABNORMAL HIGH (ref 11.5–15.5)
WBC: 10 10*3/uL (ref 4.0–10.5)
nRBC: 0 % (ref 0.0–0.2)

## 2018-12-25 LAB — BASIC METABOLIC PANEL
ANION GAP: 13 (ref 5–15)
BUN: 7 mg/dL (ref 6–20)
CO2: 25 mmol/L (ref 22–32)
Calcium: 8.9 mg/dL (ref 8.9–10.3)
Chloride: 95 mmol/L — ABNORMAL LOW (ref 98–111)
Creatinine, Ser: 0.53 mg/dL (ref 0.44–1.00)
GFR calc Af Amer: >60 mL/min (ref >60)
GFR calc non Af Amer: >60 mL/min (ref >60)
Glucose, Bld: 232 mg/dL — ABNORMAL HIGH (ref 70–99)
Potassium: 3 mmol/L — ABNORMAL LOW (ref 3.5–5.1)
Sodium: 133 mmol/L — ABNORMAL LOW (ref 135–145)

## 2018-12-25 LAB — GLUCOSE, CAPILLARY
GLUCOSE-CAPILLARY: 201 mg/dL — AB (ref 70–99)
Glucose-Capillary: 187 mg/dL — ABNORMAL HIGH (ref 70–99)
Glucose-Capillary: 179 mg/dL — ABNORMAL HIGH (ref 70–99)
Glucose-Capillary: 188 mg/dL — ABNORMAL HIGH (ref 70–99)
Glucose-Capillary: 186 mg/dL — ABNORMAL HIGH (ref 70–99)

## 2018-12-25 LAB — LIPID PANEL
Cholesterol: 203 mg/dL — ABNORMAL HIGH (ref 0–200)
HDL: 107 mg/dL (ref >40)
LDL Cholesterol: 79 mg/dL (ref 0–99)
TRIGLYCERIDES: 87 mg/dL (ref <150)
Total CHOL/HDL Ratio: 1.9 RATIO
VLDL: 17 mg/dL (ref 0–40)

## 2018-12-25 LAB — HEMOGLOBIN A1C
Hgb A1c MFr Bld: 10 % — ABNORMAL HIGH (ref 4.8–5.6)
Mean Plasma Glucose: 240.3 mg/dL

## 2018-12-25 LAB — MAGNESIUM: Magnesium: 1.8 mg/dL (ref 1.7–2.4)

## 2018-12-25 LAB — LIPASE, BLOOD: Lipase: 691 U/L — ABNORMAL HIGH (ref 11–51)

## 2018-12-25 LAB — PREGNANCY, URINE: Preg Test, Ur: NEGATIVE

## 2018-12-25 MED ORDER — OXYCODONE HCL 5 MG PO TABS
5.0000 mg | ORAL_TABLET | ORAL | Status: DC | PRN
  Administered 2018-12-25 – 2018-12-28 (×7): 5 mg via ORAL
  Filled 2018-12-25 (×7): qty 1

## 2018-12-25 MED ORDER — POTASSIUM CHLORIDE IN NACL 20-0.9 MEQ/L-% IV SOLN
INTRAVENOUS | Status: DC
  Administered 2018-12-25 – 2018-12-27 (×4): via INTRAVENOUS
  Filled 2018-12-25 (×9): qty 1000

## 2018-12-25 MED ORDER — PNEUMOCOCCAL VAC POLYVALENT 25 MCG/0.5ML IJ INJ
0.5000 mL | INJECTION | INTRAMUSCULAR | Status: AC
  Administered 2018-12-26: 08:00:00 0.5 mL via INTRAMUSCULAR
  Filled 2018-12-25: qty 0.5

## 2018-12-25 MED ORDER — ONDANSETRON HCL 4 MG/2ML IJ SOLN
4.0000 mg | Freq: Four times a day (QID) | INTRAMUSCULAR | Status: DC | PRN
  Administered 2018-12-27 – 2018-12-29 (×4): 4 mg via INTRAVENOUS
  Filled 2018-12-25 (×4): qty 2

## 2018-12-25 MED ORDER — IBUPROFEN 400 MG PO TABS
400.0000 mg | ORAL_TABLET | Freq: Four times a day (QID) | ORAL | Status: DC | PRN
  Administered 2018-12-26 – 2018-12-27 (×2): 400 mg via ORAL
  Filled 2018-12-25 (×2): qty 1

## 2018-12-25 MED ORDER — ENOXAPARIN SODIUM 40 MG/0.4ML ~~LOC~~ SOLN
40.0000 mg | SUBCUTANEOUS | Status: DC
  Administered 2018-12-25 – 2018-12-29 (×5): 40 mg via SUBCUTANEOUS
  Filled 2018-12-25 (×5): qty 0.4

## 2018-12-25 MED ORDER — LORAZEPAM 2 MG/ML IJ SOLN: 0.0000 mg | Freq: Two times a day (BID) | INTRAMUSCULAR | Status: DC

## 2018-12-25 MED ORDER — LORAZEPAM 1 MG PO TABS
1.0000 mg | ORAL_TABLET | Freq: Four times a day (QID) | ORAL | Status: DC | PRN
  Administered 2018-12-26: 1 mg via ORAL
  Filled 2018-12-25: qty 1

## 2018-12-25 MED ORDER — VITAMIN B-1 100 MG PO TABS
100.0000 mg | ORAL_TABLET | Freq: Every day | ORAL | Status: DC
  Administered 2018-12-27 – 2018-12-30 (×4): 100 mg via ORAL
  Filled 2018-12-25 (×4): qty 1

## 2018-12-25 MED ORDER — LORAZEPAM 2 MG/ML IJ SOLN
0.0000 mg | Freq: Four times a day (QID) | INTRAMUSCULAR | Status: DC
  Administered 2018-12-25: 1 mg via INTRAVENOUS
  Administered 2018-12-25 – 2018-12-26 (×4): 2 mg via INTRAVENOUS
  Filled 2018-12-25 (×5): qty 1

## 2018-12-25 MED ORDER — FOLIC ACID 1 MG PO TABS
1.0000 mg | ORAL_TABLET | Freq: Every day | ORAL | Status: DC
  Administered 2018-12-25 – 2018-12-30 (×6): 1 mg via ORAL
  Filled 2018-12-25 (×6): qty 1

## 2018-12-25 MED ORDER — LABETALOL HCL 5 MG/ML IV SOLN
10.0000 mg | INTRAVENOUS | Status: DC | PRN
  Administered 2018-12-25 (×5): 10 mg via INTRAVENOUS
  Filled 2018-12-25 (×10): qty 4

## 2018-12-25 MED ORDER — THIAMINE HCL 100 MG/ML IJ SOLN
100.0000 mg | Freq: Every day | INTRAMUSCULAR | Status: DC
  Administered 2018-12-25 – 2018-12-26 (×2): 100 mg via INTRAVENOUS
  Filled 2018-12-25 (×2): qty 2

## 2018-12-25 MED ORDER — POTASSIUM CHLORIDE CRYS ER 20 MEQ PO TBCR
40.0000 meq | EXTENDED_RELEASE_TABLET | ORAL | Status: AC
  Administered 2018-12-25 (×2): 40 meq via ORAL
  Filled 2018-12-25 (×2): qty 2

## 2018-12-25 MED ORDER — HYDROMORPHONE HCL 1 MG/ML IJ SOLN
0.5000 mg | INTRAMUSCULAR | Status: DC | PRN
  Administered 2018-12-25 – 2018-12-28 (×11): 0.5 mg via INTRAVENOUS
  Filled 2018-12-25 (×11): qty 1

## 2018-12-25 MED ORDER — LORAZEPAM 2 MG/ML IJ SOLN: 1.0000 mg | Freq: Four times a day (QID) | INTRAMUSCULAR | Status: DC | PRN

## 2018-12-25 MED ORDER — SODIUM CHLORIDE 0.9 % IV SOLN
INTRAVENOUS | Status: DC
  Administered 2018-12-25 (×2): via INTRAVENOUS

## 2018-12-25 MED ORDER — ADULT MULTIVITAMIN W/MINERALS CH
1.0000 | ORAL_TABLET | Freq: Every day | ORAL | Status: DC
  Administered 2018-12-25 – 2018-12-30 (×6): 1 via ORAL
  Filled 2018-12-25 (×6): qty 1

## 2018-12-25 MED ORDER — INSULIN ASPART 100 UNIT/ML ~~LOC~~ SOLN
0.0000 [IU] | SUBCUTANEOUS | Status: DC
  Administered 2018-12-25: 09:00:00 2 [IU] via SUBCUTANEOUS
  Administered 2018-12-25: 21:00:00 3 [IU] via SUBCUTANEOUS
  Administered 2018-12-25 (×2): 2 [IU] via SUBCUTANEOUS
  Filled 2018-12-25 (×4): qty 1

## 2018-12-25 MED ORDER — VENLAFAXINE HCL ER 150 MG PO CP24
150.0000 mg | ORAL_CAPSULE | Freq: Every day | ORAL | Status: DC
  Administered 2018-12-25: 09:00:00 150 mg via ORAL
  Filled 2018-12-25: qty 2
  Filled 2018-12-25: qty 1

## 2018-12-25 MED ORDER — CLONAZEPAM 0.5 MG PO TBDP
0.5000 mg | ORAL_TABLET | Freq: Two times a day (BID) | ORAL | Status: DC
  Administered 2018-12-25: 09:00:00 0.5 mg via ORAL
  Filled 2018-12-25 (×3): qty 1

## 2018-12-25 MED ORDER — ONDANSETRON HCL 4 MG PO TABS: 4.0000 mg | ORAL_TABLET | Freq: Four times a day (QID) | ORAL | Status: DC | PRN

## 2018-12-25 MED ORDER — FLUOXETINE HCL 20 MG/5ML PO SOLN
20.0000 mg | Freq: Every day | ORAL | Status: DC
  Administered 2018-12-25 – 2018-12-26 (×2): 20 mg via ORAL
  Filled 2018-12-25 (×2): qty 5

## 2018-12-25 MED ORDER — PROMETHAZINE HCL 25 MG/ML IJ SOLN
12.5000 mg | Freq: Four times a day (QID) | INTRAMUSCULAR | Status: DC | PRN
  Administered 2018-12-25 – 2018-12-26 (×4): 25 mg via INTRAVENOUS
  Administered 2018-12-28: 12.5 mg via INTRAVENOUS
  Administered 2018-12-29: 25 mg via INTRAVENOUS
  Filled 2018-12-25 (×6): qty 1

## 2018-12-25 NOTE — Progress Notes (Signed)
Attempted to see patient.  Nursing reported she had required Ativan per CIWA protocol along with pain medication, Klonopin and Zofran and was sedated.  Nursing staff will contact psychiatry when patient is awake and alert. Medications reviewed.  Agree with Venlafaxine 150 mg daily and Hold Adderall.  Mariel Craft, MD
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
## 2018-12-25 NOTE — Plan of Care (Addendum)
Patient BP and HR continues to be high.  Have given Pain/nausea meds, ativan (CIWH score of 11) and labetolol IV PRN x2.  Dr. Thomasene Ripple to inform current mews of 4. No further orders at this time. Will continue to monitor.

## 2018-12-25 NOTE — Progress Notes (Signed)
BP 156/107, HR 96. Received verbal order to decrease fluid rate to 139ml/hr from Dr. Sheryle Hail.

## 2018-12-25 NOTE — Progress Notes (Signed)
Patient's HR sustaining 120, received verbal order for labetalol 10mg  one time order and cardiac monitor from Dr. Sheryle Hail.

## 2018-12-25 NOTE — Progress Notes (Signed)
[employer] - [place] [facility]   PATIENT NAME: Stephanie Levy    MR#:  [id number]  DATE OF BIRTH:  13-Mar-1981  SUBJECTIVE:  CHIEF COMPLAINT:   Chief Complaint  Patient presents with  . Emesis   Continues to have abdominal pain and nausea.  REVIEW OF SYSTEMS:    Review of Systems  Constitutional: Positive for malaise/fatigue. Negative for chills and fever.  HENT: Negative for sore throat.   Eyes: Negative for blurred vision, double vision and pain.  Respiratory: Negative for cough, hemoptysis, shortness of breath and wheezing.   Cardiovascular: Negative for chest pain, palpitations, orthopnea and leg swelling.  Gastrointestinal: Positive for abdominal pain and nausea. Negative for constipation, diarrhea, heartburn and vomiting.  Genitourinary: Negative for dysuria and hematuria.  Musculoskeletal: Negative for back pain and joint pain.  Skin: Negative for rash.  Neurological: Negative for sensory change, speech change, focal weakness and headaches.  Endo/Heme/Allergies: Does not bruise/bleed easily.  Psychiatric/Behavioral: Negative for depression. The patient is nervous/anxious.     DRUG ALLERGIES:   Allergies  Allergen Reactions  . Peanut-Containing Drug Products Anaphylaxis  . Amoxicillin Other (See Comments)    Gi distress  . Citrus Rash    VITALS:  Blood pressure (!) 156/107, pulse 96, temperature 97.9 F (36.6 C), temperature source Oral, resp. rate 16, height 5\' 8"  (1.727 m), weight 113.4 kg, last menstrual period 11/25/2018, SpO2 97 %.  PHYSICAL EXAMINATION:   Physical Exam  GENERAL:  38 y.o.-year-old patient lying in the bed with no acute distress.  EYES: Pupils equal, round, reactive to light and accommodation. No scleral icterus. Extraocular muscles intact.  HEENT: Head atraumatic, normocephalic. Oropharynx and nasopharynx clear.  NECK:  Supple, no jugular venous distention. No thyroid enlargement, no tenderness.  LUNGS: Normal breath  sounds bilaterally, no wheezing, rales, rhonchi. No use of accessory muscles of respiration.  CARDIOVASCULAR: S1, S2 normal. No murmurs, rubs, or gallops.  ABDOMEN: Soft, diffusely tender, nondistended. Bowel sounds present. No organomegaly or mass.  EXTREMITIES: No cyanosis, clubbing or edema b/l.    NEUROLOGIC: Cranial nerves II through XII are intact. No focal Motor or sensory deficits b/l.   PSYCHIATRIC: The patient is alert and oriented x 3.  Anxious SKIN: No obvious rash, lesion, or ulcer.   LABORATORY PANEL:   CBC Recent Labs  Lab 12/25/18 0043  WBC 10.0  HGB 13.3  HCT 41.6  PLT 258   ------------------------------------------------------------------------------------------------------------------ Chemistries  Recent Labs  Lab 12/24/18 2112 12/25/18 0043  NA 130* 133*  K 3.2* 3.0*  CL 94* 95*  CO2 23 25  GLUCOSE 242* 232*  BUN 7 7  CREATININE 0.49 0.53  CALCIUM 8.7* 8.9  MG  --  1.8  AST 320*  --   ALT 115*  --   ALKPHOS 130*  --   BILITOT 1.7*  --    ------------------------------------------------------------------------------------------------------------------  Cardiac Enzymes No results for input(s): TROPONINI in the last 168 hours. ------------------------------------------------------------------------------------------------------------------  RADIOLOGY:  US Abdomen Complete  Result Date: 12/24/2018 CLINICAL DATA:  Pain. EXAM: ABDOMEN ULTRASOUND COMPLETE COMPARISON:  10/14/2017 FINDINGS: Gallbladder: Surgically absent. Common bile duct: Diameter: 6 mm Liver: Increased echogenicity of liver parenchyma again noted. No focal abnormality. Portal vein is patent on color Doppler imaging with normal direction of blood flow towards the liver. IVC: Not well seen due to bowel gas. Pancreas: Largely obscured by midline bowel gas. Spleen: Size and appearance within normal limits. Right Kidney: Length: 11.4 cm. Echogenicity within normal limits. No mass or  hydronephrosis visualized. Left Kidney: Length: 11.2 cm. Echogenicity within normal limits. No mass or hydronephrosis visualized. Abdominal aorta: No aneurysm visualized. Other findings: Trace free fluid adjacent to the left hepatic lobe. IMPRESSION: 1. Increased echogenicity of the liver parenchyma suggests fatty deposition. 2. Trace free fluid adjacent to the left hepatic lobe. Electronically Signed   By: Kennith Center M.D.   On: 12/24/2018 22:08     ASSESSMENT AND PLAN:   * Acute Alcoholic pancreatitis Lipase continues to be elevated. No vomiting.  We will keep her on clear liquid diet. Pain medications as needed. Triglycerides normal.  Ultrasound shows no gallstones. Counseled to quit alcohol  * HTN (hypertension) -home dose antihypertensives   * Alcohol abuse -CIWA protocol   * Anxiety -home dose anxiolytic Patient seems to have significant depression and has been drinking alcohol since her mother died 2 years back.  She is requesting a psychiatry consult.  I discussed with Dr. Viviano Simas.  Consult placed.  *Hyponatremia secondary to dehydration.  On IV fluids.  *Hypokalemia.  Replace through IV and oral.  Check magnesium level.  DVT prophylaxis with Lovenox  All the records are reviewed and case discussed with Care Management/Social Worker Management plans discussed with the patient, family and they are in agreement.  CODE STATUS: FULL CODE  DVT Prophylaxis: SCDs  TOTAL TIME TAKING CARE OF THIS PATIENT: 35 minutes.   POSSIBLE D/C IN 2-3 DAYS, DEPENDING ON CLINICAL CONDITION.  Molinda Bailiff Kamaal Cast M.D on 12/25/2018 at 11:01 AM  Between 7am to 6pm - Pager - [phone]  After 6pm go to [email or web address] - password [email or web address]  [facility]  Office  [phone]  CC: Primary care physician; System, Pcp Not In  Note: This dictation was prepared with Dragon dictation along with smaller phrase technology. Any transcriptional errors that result from this process are  unintentional.
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
## 2018-12-26 DIAGNOSIS — K852 Alcohol induced acute pancreatitis without necrosis or infection: Secondary | ICD-10-CM | POA: Diagnosis not present

## 2018-12-26 DIAGNOSIS — F1010 Alcohol abuse, uncomplicated: Secondary | ICD-10-CM | POA: Diagnosis not present

## 2018-12-26 DIAGNOSIS — F331 Major depressive disorder, recurrent, moderate: Secondary | ICD-10-CM | POA: Diagnosis not present

## 2018-12-26 DIAGNOSIS — F419 Anxiety disorder, unspecified: Secondary | ICD-10-CM | POA: Diagnosis not present

## 2018-12-26 LAB — COMPREHENSIVE METABOLIC PANEL
ALT: 220 U/L — ABNORMAL HIGH (ref 0–44)
AST: 457 U/L — ABNORMAL HIGH (ref 15–41)
Albumin: 2.5 g/dL — ABNORMAL LOW (ref 3.5–5.0)
Alkaline Phosphatase: 177 U/L — ABNORMAL HIGH (ref 38–126)
Anion gap: 10 (ref 5–15)
BILIRUBIN TOTAL: 1.7 mg/dL — AB (ref 0.3–1.2)
BUN: 6 mg/dL (ref 6–20)
CO2: 21 mmol/L — ABNORMAL LOW (ref 22–32)
Calcium: 8.6 mg/dL — ABNORMAL LOW (ref 8.9–10.3)
Chloride: 106 mmol/L (ref 98–111)
Creatinine, Ser: 0.44 mg/dL (ref 0.44–1.00)
GFR calc Af Amer: >60 mL/min (ref >60)
GFR calc non Af Amer: >60 mL/min (ref >60)
Glucose, Bld: 196 mg/dL — ABNORMAL HIGH (ref 70–99)
POTASSIUM: 4.3 mmol/L (ref 3.5–5.1)
Sodium: 137 mmol/L (ref 135–145)
TOTAL PROTEIN: 5.9 g/dL — AB (ref 6.5–8.1)

## 2018-12-26 LAB — GLUCOSE, CAPILLARY
Glucose-Capillary: 189 mg/dL — ABNORMAL HIGH (ref 70–99)
Glucose-Capillary: 187 mg/dL — ABNORMAL HIGH (ref 70–99)
Glucose-Capillary: 219 mg/dL — ABNORMAL HIGH (ref 70–99)
Glucose-Capillary: 197 mg/dL — ABNORMAL HIGH (ref 70–99)
Glucose-Capillary: 168 mg/dL — ABNORMAL HIGH (ref 70–99)

## 2018-12-26 LAB — LIPASE, BLOOD: Lipase: 292 U/L — ABNORMAL HIGH (ref 11–51)

## 2018-12-26 LAB — HIV ANTIBODY (ROUTINE TESTING W REFLEX): HIV Screen 4th Generation wRfx: NONREACTIVE

## 2018-12-26 MED ORDER — CHLORDIAZEPOXIDE HCL 5 MG PO CAPS
25.0000 mg | ORAL_CAPSULE | Freq: Three times a day (TID) | ORAL | Status: DC
  Administered 2018-12-26: 25 mg via ORAL
  Filled 2018-12-26: qty 5

## 2018-12-26 MED ORDER — CHLORDIAZEPOXIDE HCL 5 MG PO CAPS
25.0000 mg | ORAL_CAPSULE | ORAL | Status: DC
  Administered 2018-12-26: 25 mg via ORAL
  Filled 2018-12-26 (×2): qty 5

## 2018-12-26 MED ORDER — LABETALOL HCL 5 MG/ML IV SOLN
10.0000 mg | INTRAVENOUS | Status: DC | PRN
  Administered 2018-12-26 – 2018-12-28 (×4): 10 mg via INTRAVENOUS
  Filled 2018-12-26: qty 4

## 2018-12-26 MED ORDER — INSULIN GLARGINE 100 UNIT/ML ~~LOC~~ SOLN
8.0000 [IU] | Freq: Every day | SUBCUTANEOUS | Status: DC
  Administered 2018-12-26 – 2018-12-28 (×3): 8 [IU] via SUBCUTANEOUS
  Filled 2018-12-26 (×4): qty 0.08

## 2018-12-26 MED ORDER — METOPROLOL TARTRATE 50 MG PO TABS
50.0000 mg | ORAL_TABLET | Freq: Two times a day (BID) | ORAL | Status: DC
  Administered 2018-12-26 – 2018-12-30 (×9): 50 mg via ORAL
  Filled 2018-12-26 (×9): qty 1

## 2018-12-26 MED ORDER — CARVEDILOL 3.125 MG PO TABS
3.1250 mg | ORAL_TABLET | Freq: Two times a day (BID) | ORAL | Status: DC
  Filled 2018-12-26: qty 1

## 2018-12-26 MED ORDER — NITROGLYCERIN 2 % TD OINT
0.5000 [in_us] | TOPICAL_OINTMENT | Freq: Four times a day (QID) | TRANSDERMAL | Status: DC | PRN
  Administered 2018-12-26: 0.5 [in_us] via TOPICAL
  Filled 2018-12-26: qty 1

## 2018-12-26 MED ORDER — INSULIN ASPART 100 UNIT/ML ~~LOC~~ SOLN: 0.0000 [IU] | Freq: Three times a day (TID) | SUBCUTANEOUS | Status: DC

## 2018-12-26 MED ORDER — INSULIN ASPART 100 UNIT/ML ~~LOC~~ SOLN
0.0000 [IU] | Freq: Four times a day (QID) | SUBCUTANEOUS | Status: DC
  Administered 2018-12-26: 2 [IU] via SUBCUTANEOUS
  Filled 2018-12-26: qty 1

## 2018-12-26 MED ORDER — GLIMEPIRIDE 1 MG PO TABS
1.0000 mg | ORAL_TABLET | Freq: Every day | ORAL | Status: DC
  Administered 2018-12-26 – 2018-12-27 (×2): 1 mg via ORAL
  Filled 2018-12-26 (×2): qty 1

## 2018-12-26 MED ORDER — INSULIN ASPART 100 UNIT/ML ~~LOC~~ SOLN
0.0000 [IU] | Freq: Three times a day (TID) | SUBCUTANEOUS | Status: DC
  Administered 2018-12-26: 17:00:00 2 [IU] via SUBCUTANEOUS
  Administered 2018-12-26: 13:00:00 3 [IU] via SUBCUTANEOUS
  Administered 2018-12-27: 5 [IU] via SUBCUTANEOUS
  Administered 2018-12-27: 13:00:00 2 [IU] via SUBCUTANEOUS
  Administered 2018-12-28: 3 [IU] via SUBCUTANEOUS
  Administered 2018-12-28 – 2018-12-29 (×3): 2 [IU] via SUBCUTANEOUS
  Administered 2018-12-29: 3 [IU] via SUBCUTANEOUS
  Administered 2018-12-29: 2 [IU] via SUBCUTANEOUS
  Administered 2018-12-30: 09:00:00 3 [IU] via SUBCUTANEOUS
  Filled 2018-12-26 (×10): qty 1

## 2018-12-26 MED ORDER — CARVEDILOL 25 MG PO TABS: 25.0000 mg | ORAL_TABLET | Freq: Two times a day (BID) | ORAL | Status: DC

## 2018-12-26 MED ORDER — INSULIN ASPART 100 UNIT/ML ~~LOC~~ SOLN
0.0000 [IU] | Freq: Every day | SUBCUTANEOUS | Status: DC
  Administered 2018-12-28: 22:00:00 2 [IU] via SUBCUTANEOUS
  Administered 2018-12-29: 22:00:00 3 [IU] via SUBCUTANEOUS
  Filled 2018-12-26 (×2): qty 1

## 2018-12-26 MED ORDER — CHLORDIAZEPOXIDE HCL 5 MG PO CAPS
25.0000 mg | ORAL_CAPSULE | ORAL | Status: DC
  Filled 2018-12-26: qty 5

## 2018-12-26 MED ORDER — FLUOXETINE HCL 20 MG/5ML PO SOLN
20.0000 mg | Freq: Two times a day (BID) | ORAL | Status: DC
  Administered 2018-12-26 – 2018-12-27 (×2): 20 mg via ORAL
  Filled 2018-12-26 (×3): qty 5

## 2018-12-26 MED ORDER — CHLORDIAZEPOXIDE HCL 5 MG PO CAPS
25.0000 mg | ORAL_CAPSULE | Freq: Three times a day (TID) | ORAL | Status: DC
  Administered 2018-12-26: 25 mg via ORAL
  Filled 2018-12-26 (×3): qty 5

## 2018-12-26 NOTE — Progress Notes (Addendum)
Inpatient Diabetes Program Recommendations  AACE/ADA: New Consensus Statement on Inpatient Glycemic Control   Target Ranges:  Prepandial:   less than 140 mg/dL      Peak postprandial:   less than 180 mg/dL (1-2 hours)      Critically ill patients:  140 - 180 mg/dL   Results for Stephanie Levy, Stephanie Levy (MRN [id number]) as of 12/26/2018 10:06  Ref. Range 12/25/2018 08:04 12/25/2018 11:44 12/25/2018 16:48 12/25/2018 19:38 12/25/2018 21:05 12/26/2018 01:26 12/26/2018 06:32  Glucose-Capillary Latest Ref Range: 70 - 99 mg/dL 373 (H) 428 (H) 768 (H) 186 (H) 201 (H) 189 (H) 187 (H)  Results for Stephanie Levy, Stephanie Levy (MRN [id number]) as of 12/26/2018 10:06  Ref. Range 12/24/2018 21:12 12/25/2018 00:43  Glucose Latest Ref Range: 70 - 99 mg/dL 559 (H) 741 (H)  Hemoglobin A1C Latest Ref Range: 4.8 - 5.6 %  10.0 (H)   Review of Glycemic Control  Diabetes history: DM2 hx (per chart, pt reports DM2 resolved following gastric bypass in 2008) Outpatient Diabetes medications: None Current orders for Inpatient glycemic control: Novolog 0-9 units TID with meals, Novolog 0-5 units QHS  Inpatient Diabetes Program Recommendations:   HgbA1C: A1C 10% on 12/25/18 indicating an average glucose of 240 mg/dl over the past 2-3 months. Per Care Everywhere, patient was inpatient at [facility] from 09/02/18 to 09/03/18 for ETOH pancreatitis and A1C was noted to be 10.2% and patient was not discharged on any DM medications. Patient will need outpatient DM medication prescribed at discharge.  Addendum: Spoke with patient about diabetes and home regimen for diabetes control. Patient was very drowsy but awoke easily to verbal stimulus but kept drifting back off during our conversation.  Patient confirms that she had DM2 and use to take Metformin before her gastric bypass surgery in 2008. Patient notes that after having the surgery she did not require any medications for DM.  Patient states that she still checks glucose at home from time to time and it has been in  the mid to supper 100's mg/dl at home.  Patient reports that she is followed by PCP for healthcare needs.  Inquired about hospital admission at [facility] 09/02/18 to 09/03/18. Patient confirms that she was admitted with pancreatitis then as well. Inquired about her A1C results during that admission and patient states that she was not told anything about her A1C during that time. Informed patient that according to hospital admission notes, her A1C was 10.2% then.  Discussed A1C results (10.0% on 12/25/18) and explained that current A1C indicates an average glucose of 240 mg/dl over the past 2-3 months. Discussed glucose and A1C goals. Discussed importance of checking CBGs and maintaining good CBG control to prevent long-term and short-term complications. Explained how hyperglycemia leads to damage within blood vessels which lead to the common complications seen with uncontrolled diabetes. Stressed to the patient the importance of improving glycemic control to prevent further complications from uncontrolled diabetes. Discussed impact of nutrition, exercise, stress, sickness, and medications on diabetes control. Discussed how pancreatitis can impact glycemic control.  Informed patient she will need to take medication for DM control as an outpatient and patient states she is willing to take DM medication again as an outpatient if prescribed at discharge. Patient verbalized understanding of information discussed and she states that she has no further questions at this time related to diabetes. Per progress note by Dr.Maurer on 12/25/18,patient is not interested in treatment for alcohol at this time.  Therefore, would not recommend using Metformin as an outpatient due to  risk of lactic acidosis.   Thanks, Orlando Penner, RN, MSN, CDE Diabetes Coordinator Inpatient Diabetes Program [phone] (Team Pager from 8am to 5pm)
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
## 2018-12-26 NOTE — Progress Notes (Signed)
[facility] - [place] at [facility]   PATIENT NAME: Stephanie Levy    MR#:  [id number]  DATE OF BIRTH:  26-Jan-1981  SUBJECTIVE:  CHIEF COMPLAINT:   Chief Complaint  Patient presents with  . Emesis   Continues to have abdominal pain and nausea.  On liquid diet but poor appetite.  Blood pressure and heart rate continued to be elevated.  REVIEW OF SYSTEMS:    Review of Systems  Constitutional: Positive for malaise/fatigue. Negative for chills and fever.  HENT: Negative for sore throat.   Eyes: Negative for blurred vision, double vision and pain.  Respiratory: Negative for cough, hemoptysis, shortness of breath and wheezing.   Cardiovascular: Negative for chest pain, palpitations, orthopnea and leg swelling.  Gastrointestinal: Positive for abdominal pain and nausea. Negative for constipation, diarrhea, heartburn and vomiting.  Genitourinary: Negative for dysuria and hematuria.  Musculoskeletal: Negative for back pain and joint pain.  Skin: Negative for rash.  Neurological: Negative for sensory change, speech change, focal weakness and headaches.  Endo/Heme/Allergies: Does not bruise/bleed easily.  Psychiatric/Behavioral: Negative for depression. The patient is nervous/anxious.     DRUG ALLERGIES:   Allergies  Allergen Reactions  . Peanut-Containing Drug Products Anaphylaxis  . Amoxicillin Other (See Comments)    Gi distress  . Citrus Rash    VITALS:  Blood pressure (!) 149/104, pulse (!) 117, temperature 99 F (37.2 C), temperature source Oral, resp. rate (!) 30, height 5\' 8"  (1.727 m), weight 113.4 kg, last menstrual period 11/25/2018, SpO2 95 %.  PHYSICAL EXAMINATION:   Physical Exam  GENERAL:  38 y.o.-year-old patient lying in the bed . EYES: Pupils equal, round, reactive to light and accommodation. No scleral icterus. Extraocular muscles intact.  HEENT: Head atraumatic, normocephalic. Oropharynx and nasopharynx clear.  NECK:  Supple, no jugular  venous distention. No thyroid enlargement, no tenderness.  LUNGS: Normal breath sounds bilaterally, no wheezing, rales, rhonchi. No use of accessory muscles of respiration.  CARDIOVASCULAR: S1, S2 normal. No murmurs, rubs, or gallops.  ABDOMEN: Soft, diffusely tender, nondistended. Bowel sounds present. No organomegaly or mass.  EXTREMITIES: No cyanosis, clubbing or edema b/l.    NEUROLOGIC: Cranial nerves II through XII are intact. No focal Motor or sensory deficits b/l.   PSYCHIATRIC: The patient is alert and oriented x 3.  Anxious SKIN: No obvious rash, lesion, or ulcer.   LABORATORY PANEL:   CBC Recent Labs  Lab 12/25/18 0043  WBC 10.0  HGB 13.3  HCT 41.6  PLT 258   ------------------------------------------------------------------------------------------------------------------ Chemistries  Recent Labs  Lab 12/25/18 0043 12/26/18 0418  NA 133* 137  K 3.0* 4.3  CL 95* 106  CO2 25 21*  GLUCOSE 232* 196*  BUN 7 6  CREATININE 0.53 0.44  CALCIUM 8.9 8.6*  MG 1.8  --   AST  --  457*  ALT  --  220*  ALKPHOS  --  177*  BILITOT  --  1.7*   ------------------------------------------------------------------------------------------------------------------  Cardiac Enzymes No results for input(s): TROPONINI in the last 168 hours. ------------------------------------------------------------------------------------------------------------------  RADIOLOGY:  US Abdomen Complete  Result Date: 12/24/2018 CLINICAL DATA:  Pain. EXAM: ABDOMEN ULTRASOUND COMPLETE COMPARISON:  10/14/2017 FINDINGS: Gallbladder: Surgically absent. Common bile duct: Diameter: 6 mm Liver: Increased echogenicity of liver parenchyma again noted. No focal abnormality. Portal vein is patent on color Doppler imaging with normal direction of blood flow towards the liver. IVC: Not well seen due to bowel gas. Pancreas: Largely obscured by midline bowel gas. Spleen: Size and appearance  within normal limits. Right  Kidney: Length: 11.4 cm. Echogenicity within normal limits. No mass or hydronephrosis visualized. Left Kidney: Length: 11.2 cm. Echogenicity within normal limits. No mass or hydronephrosis visualized. Abdominal aorta: No aneurysm visualized. Other findings: Trace free fluid adjacent to the left hepatic lobe. IMPRESSION: 1. Increased echogenicity of the liver parenchyma suggests fatty deposition. 2. Trace free fluid adjacent to the left hepatic lobe. Electronically Signed   By: Kennith Center M.D.   On: 12/24/2018 22:08     ASSESSMENT AND PLAN:   * Acute Alcoholic pancreatitis Lipase level trending down but patient still has significant pain No vomiting.  We will keep her on clear liquid diet. Pain medications as needed. Triglycerides normal.  Ultrasound showed no gallstones. Counseled to quit alcohol  * HTN (hypertension) Added metoprolol  *Diabetes mellitus.  Sliding scale insulin added.  Hemoglobin A1c 10.   * Alcohol abuse -CIWA protocol Added Librium today due to withdrawals.  *Transaminitis secondary to alcohol.  Mild increased today.  Will repeat in the morning.  If continues to worsen we will have to consult GI. Fatty liver found on ultrasound.   * Anxiety/depression Psychiatry consulted.  Appreciate input. On Prozac.  *Hyponatremia secondary to dehydration.  On IV fluids.  *Hypokalemia.  Replace through IV and oral.  Check magnesium level.  DVT prophylaxis with Lovenox  All the records are reviewed and case discussed with Care Management/Social Worker Management plans discussed with the patient, family and they are in agreement.  CODE STATUS: FULL CODE  TOTAL TIME TAKING CARE OF THIS PATIENT: 35 minutes.   POSSIBLE D/C IN 2-3 DAYS, DEPENDING ON CLINICAL CONDITION.  Molinda Bailiff Nyeli Holtmeyer M.D on 12/26/2018 at 10:34 AM  Between 7am to 6pm - Pager - [phone]  After 6pm go to [email or web address] - password [email or web address]  [facility] [place] [facility]  Office   [phone]  CC: Primary care physician; System, Pcp Not In  Note: This dictation was prepared with Dragon dictation along with smaller phrase technology. Any transcriptional errors that result from this process are unintentional.

## 2018-12-27 ENCOUNTER — Inpatient Hospital Stay: Payer: BLUE CROSS/BLUE SHIELD

## 2018-12-27 LAB — COMPREHENSIVE METABOLIC PANEL
ALK PHOS: 177 U/L — AB (ref 38–126)
ALT: 171 U/L — ABNORMAL HIGH (ref 0–44)
AST: 149 U/L — ABNORMAL HIGH (ref 15–41)
Albumin: 2.6 g/dL — ABNORMAL LOW (ref 3.5–5.0)
Anion gap: 7 (ref 5–15)
BUN: 7 mg/dL (ref 6–20)
CO2: 23 mmol/L (ref 22–32)
Calcium: 8.8 mg/dL — ABNORMAL LOW (ref 8.9–10.3)
Chloride: 106 mmol/L (ref 98–111)
Creatinine, Ser: 0.52 mg/dL (ref 0.44–1.00)
GFR calc Af Amer: >60 mL/min (ref >60)
GFR calc non Af Amer: >60 mL/min (ref >60)
Glucose, Bld: 214 mg/dL — ABNORMAL HIGH (ref 70–99)
Potassium: 4.4 mmol/L (ref 3.5–5.1)
Sodium: 136 mmol/L (ref 135–145)
Total Bilirubin: 1.3 mg/dL — ABNORMAL HIGH (ref 0.3–1.2)
Total Protein: 6.4 g/dL — ABNORMAL LOW (ref 6.5–8.1)

## 2018-12-27 LAB — PROTIME-INR
INR: 1 (ref 0.8–1.2)
Prothrombin Time: 13.4 seconds (ref 11.4–15.2)

## 2018-12-27 LAB — GLUCOSE, CAPILLARY
Glucose-Capillary: 252 mg/dL — ABNORMAL HIGH (ref 70–99)
Glucose-Capillary: 161 mg/dL — ABNORMAL HIGH (ref 70–99)
Glucose-Capillary: 119 mg/dL — ABNORMAL HIGH (ref 70–99)
Glucose-Capillary: 195 mg/dL — ABNORMAL HIGH (ref 70–99)

## 2018-12-27 MED ORDER — FLUOXETINE HCL 20 MG/5ML PO SOLN
20.0000 mg | Freq: Once | ORAL | Status: AC
  Administered 2018-12-27: 20 mg via ORAL
  Filled 2018-12-27: qty 5

## 2018-12-27 MED ORDER — FLUOXETINE HCL 20 MG/5ML PO SOLN
40.0000 mg | Freq: Every day | ORAL | Status: DC
  Administered 2018-12-28 – 2018-12-30 (×3): 40 mg via ORAL
  Filled 2018-12-27 (×4): qty 10

## 2018-12-27 MED ORDER — CHLORDIAZEPOXIDE HCL 5 MG PO CAPS
25.0000 mg | ORAL_CAPSULE | ORAL | Status: AC
  Administered 2018-12-27 (×2): 25 mg via ORAL
  Filled 2018-12-27: qty 5

## 2018-12-27 MED ORDER — CHLORDIAZEPOXIDE HCL 5 MG PO CAPS
25.0000 mg | ORAL_CAPSULE | ORAL | Status: AC
  Administered 2018-12-28: 25 mg via ORAL
  Filled 2018-12-27: qty 5

## 2018-12-27 MED ORDER — FUROSEMIDE 10 MG/ML IJ SOLN
40.0000 mg | Freq: Once | INTRAMUSCULAR | Status: AC
  Administered 2018-12-27: 17:00:00 40 mg via INTRAVENOUS
  Filled 2018-12-27: qty 4
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
## 2018-12-27 NOTE — Progress Notes (Signed)
[facility] - [place] [facility]   PATIENT NAME: Stephanie Levy    MR#:  [id number]  DATE OF BIRTH:  December 07, 1980  SUBJECTIVE:  CHIEF COMPLAINT:   Chief Complaint  Patient presents with  . Emesis   Abd pain and nausea.  REVIEW OF SYSTEMS:    Review of Systems  Constitutional: Positive for malaise/fatigue. Negative for chills and fever.  HENT: Negative for sore throat.   Eyes: Negative for blurred vision, double vision and pain.  Respiratory: Negative for cough, hemoptysis, shortness of breath and wheezing.   Cardiovascular: Negative for chest pain, palpitations, orthopnea and leg swelling.  Gastrointestinal: Positive for abdominal pain and nausea. Negative for constipation, diarrhea, heartburn and vomiting.  Genitourinary: Negative for dysuria and hematuria.  Musculoskeletal: Negative for back pain and joint pain.  Skin: Negative for rash.  Neurological: Negative for sensory change, speech change, focal weakness and headaches.  Endo/Heme/Allergies: Does not bruise/bleed easily.  Psychiatric/Behavioral: Negative for depression. The patient is nervous/anxious.     DRUG ALLERGIES:   Allergies  Allergen Reactions  . Peanut-Containing Drug Products Anaphylaxis  . Amoxicillin Other (See Comments)    Gi distress  . Citrus Rash    VITALS:  Blood pressure 130/86, pulse (!) 101, temperature 99.6 F (37.6 C), temperature source Oral, resp. rate (!) 28, height 5\' 8"  (1.727 m), weight 113.4 kg, last menstrual period 11/25/2018, SpO2 93 %.  PHYSICAL EXAMINATION:   Physical Exam  GENERAL:  38 y.o.-year-old patient lying in the bed . EYES: Pupils equal, round, reactive to light and accommodation. No scleral icterus. Extraocular muscles intact.  HEENT: Head atraumatic, normocephalic. Oropharynx and nasopharynx clear.  NECK:  Supple, no jugular venous distention. No thyroid enlargement, no tenderness.  LUNGS: Normal breath sounds bilaterally, no wheezing, rales,  rhonchi. No use of accessory muscles of respiration.  CARDIOVASCULAR: S1, S2 normal. No murmurs, rubs, or gallops.  ABDOMEN: Soft, diffusely tender, nondistended. Bowel sounds present. No organomegaly or mass.  EXTREMITIES: No cyanosis, clubbing or edema b/l.    NEUROLOGIC: Cranial nerves II through XII are intact. No focal Motor or sensory deficits b/l.   PSYCHIATRIC: The patient is alert and oriented x 3.  Anxious SKIN: No obvious rash, lesion, or ulcer.   LABORATORY PANEL:   CBC Recent Labs  Lab 12/25/18 0043  WBC 10.0  HGB 13.3  HCT 41.6  PLT 258   ------------------------------------------------------------------------------------------------------------------ Chemistries  Recent Labs  Lab 12/25/18 0043  12/27/18 0255  NA 133*   < > 136  K 3.0*   < > 4.4  CL 95*   < > 106  CO2 25   < > 23  GLUCOSE 232*   < > 214*  BUN 7   < > 7  CREATININE 0.53   < > 0.52  CALCIUM 8.9   < > 8.8*  MG 1.8  --   --   AST  --    < > 149*  ALT  --    < > 171*  ALKPHOS  --    < > 177*  BILITOT  --    < > 1.3*   < > = values in this interval not displayed.   ------------------------------------------------------------------------------------------------------------------  Cardiac Enzymes No results for input(s): TROPONINI in the last 168 hours. ------------------------------------------------------------------------------------------------------------------  RADIOLOGY:  No results found.   ASSESSMENT AND PLAN:  *Acute hypoxic respiratory failure Saturations 85% on room air overnight.  Now on 2 L oxygen.  Decreased breath sounds at the bases.  Will check chest x-ray now.  Incentive spirometer added.  No wheezing.  Afebrile.  Suspect atelectasis or pleural effusions.   * Acute Alcoholic pancreatitis Lipase level trending down but patient still has pain No vomiting.   Advance to full liquid diet Pain medications as needed. Triglycerides normal.  Ultrasound showed no  gallstones. Counseled to quit alcohol  * HTN (hypertension) Added metoprolol  *Diabetes mellitus.  Sliding scale insulin added.  Hemoglobin A1c 10. Lantus added   * Alcohol abuse -CIWA protocol Added Librium today due to withdrawals.  *Transaminitis secondary to alcohol.   Improving Fatty liver on ultrasound   * Anxiety/depression Psychiatry consulted.  Appreciate input. On Prozac.  *Hyponatremia secondary to dehydration.   Improved  *Hypokalemia.  Replaced  DVT prophylaxis with Lovenox  All the records are reviewed and case discussed with Care Management/Social Worker Management plans discussed with the patient, family and they are in agreement.  CODE STATUS: FULL CODE  TOTAL TIME TAKING CARE OF THIS PATIENT: 35 minutes.   POSSIBLE D/C IN 2-3 DAYS, DEPENDING ON CLINICAL CONDITION.  Molinda Bailiff Robin Petrakis M.D on 12/27/2018 at 12:27 PM  Between 7am to 6pm - Pager - [phone]  After 6pm go to [email or web address] - password [email or web address]  [facility] [place] [facility]  Office  [phone]  CC: Primary care physician; System, Pcp Not In  Note: This dictation was prepared with Dragon dictation along with smaller phrase technology. Any transcriptional errors that result from this process are unintentional.

## 2018-12-27 NOTE — Progress Notes (Signed)
Inpatient Diabetes Program Recommendations  AACE/ADA: New Consensus Statement on Inpatient Glycemic Control  Target Ranges:  Prepandial:   less than 140 mg/dL      Peak postprandial:   less than 180 mg/dL (1-2 hours)      Critically ill patients:  140 - 180 mg/dL  Results for Stephanie Levy, Stephanie Levy (MRN [id number]) as of 12/27/2018 08:22  Ref. Range 12/26/2018 06:32 12/26/2018 11:49 12/26/2018 16:49 12/26/2018 21:41 12/27/2018 07:43  Glucose-Capillary Latest Ref Range: 70 - 99 mg/dL 409 (H) 811 (H) 914 (H) 168 (H) 252 (H)    Review of Glycemic Control  Diabetes history: DM2 hx (per chart, pt reports DM2 resolved following gastric bypass in 2008) Outpatient Diabetes medications: None Current orders for Inpatient glycemic control: Lantus 8 units daily, Novolog 0-9 units TID with meals, Novolog 0-5 units QHS, Amaryl 1 mg QAM  Inpatient Diabetes Program Recommendations:   HgbA1C: A1C 10% on 12/25/18 indicating an average glucose of 240 mg/dl over the past 2-3 months. Per Care Everywhere, patient was inpatient at [facility] from 09/02/18 to 09/03/18 for ETOH pancreatitis and A1C was noted to be 10.2% and patient was not discharged on any DM medications. Patient will need outpatient DM medication prescribed at discharge.  Oral DM medications: Due to ETOH use and recurrent pancreatitis, oral DM medication options are limited. Concerned about increased risk of hypoglycemia with Sulfonylureas (such a Amaryl), increased risk of lactic acidosis with Biguanides (Metformin), and concern for pancreatitis with DPP-4 inhibitors. Would also be concerned about increased of hypoglycemia with insulin use since patient does not plan to stop drinking alcohol.  Please consider discontinuing Amaryl and starting patient on SGLT2 inhibitor such as Invokana 100 mg daily. Patient will need to follow up with PCP regarding DM control.    Thanks, Orlando Penner, RN, MSN, CDE Diabetes Coordinator Inpatient Diabetes Program [phone] (Team  Pager from 8am to 5pm)

## 2018-12-28 LAB — CBC WITH DIFFERENTIAL/PLATELET
Abs Immature Granulocytes: 0.09 10*3/uL — ABNORMAL HIGH (ref 0.00–0.07)
Basophils Absolute: 0 10*3/uL (ref 0.0–0.1)
Basophils Relative: 0 %
Eosinophils Absolute: 0.1 10*3/uL (ref 0.0–0.5)
Eosinophils Relative: 1 %
HCT: 30.8 % — ABNORMAL LOW (ref 36.0–46.0)
Hemoglobin: 9.7 g/dL — ABNORMAL LOW (ref 12.0–15.0)
Immature Granulocytes: 1 %
LYMPHS ABS: 1.6 10*3/uL (ref 0.7–4.0)
Lymphocytes Relative: 16 %
MCH: 29.8 pg (ref 26.0–34.0)
MCHC: 31.5 g/dL (ref 30.0–36.0)
MCV: 94.8 fL (ref 80.0–100.0)
Monocytes Absolute: 1.2 10*3/uL — ABNORMAL HIGH (ref 0.1–1.0)
Monocytes Relative: 12 %
Neutro Abs: 6.7 10*3/uL (ref 1.7–7.7)
Neutrophils Relative %: 70 %
Platelets: 232 10*3/uL (ref 150–400)
RBC: 3.25 MIL/uL — ABNORMAL LOW (ref 3.87–5.11)
RDW: 17.4 % — ABNORMAL HIGH (ref 11.5–15.5)
WBC: 9.7 10*3/uL (ref 4.0–10.5)
nRBC: 0.2 % (ref 0.0–0.2)

## 2018-12-28 LAB — COMPREHENSIVE METABOLIC PANEL
ALT: 111 U/L — ABNORMAL HIGH (ref 0–44)
AST: 62 U/L — ABNORMAL HIGH (ref 15–41)
Albumin: 2.4 g/dL — ABNORMAL LOW (ref 3.5–5.0)
Alkaline Phosphatase: 150 U/L — ABNORMAL HIGH (ref 38–126)
Anion gap: 13 (ref 5–15)
BUN: 8 mg/dL (ref 6–20)
CO2: 25 mmol/L (ref 22–32)
Calcium: 8.5 mg/dL — ABNORMAL LOW (ref 8.9–10.3)
Chloride: 97 mmol/L — ABNORMAL LOW (ref 98–111)
Creatinine, Ser: 0.58 mg/dL (ref 0.44–1.00)
GFR calc Af Amer: >60 mL/min (ref >60)
GFR calc non Af Amer: >60 mL/min (ref >60)
Glucose, Bld: 195 mg/dL — ABNORMAL HIGH (ref 70–99)
Potassium: 3.9 mmol/L (ref 3.5–5.1)
SODIUM: 135 mmol/L (ref 135–145)
Total Bilirubin: 1.1 mg/dL (ref 0.3–1.2)
Total Protein: 6.2 g/dL — ABNORMAL LOW (ref 6.5–8.1)

## 2018-12-28 LAB — GLUCOSE, CAPILLARY
Glucose-Capillary: 193 mg/dL — ABNORMAL HIGH (ref 70–99)
Glucose-Capillary: 235 mg/dL — ABNORMAL HIGH (ref 70–99)
Glucose-Capillary: 192 mg/dL — ABNORMAL HIGH (ref 70–99)
Glucose-Capillary: 203 mg/dL — ABNORMAL HIGH (ref 70–99)

## 2018-12-28 LAB — MAGNESIUM: Magnesium: 1.8 mg/dL (ref 1.7–2.4)

## 2018-12-28 MED ORDER — OXYCODONE HCL 5 MG PO TABS
10.0000 mg | ORAL_TABLET | ORAL | Status: DC | PRN
  Administered 2018-12-28 – 2018-12-30 (×9): 10 mg via ORAL
  Filled 2018-12-28 (×9): qty 2

## 2018-12-28 MED ORDER — HYDRALAZINE HCL 20 MG/ML IJ SOLN: 10.0000 mg | Freq: Four times a day (QID) | INTRAMUSCULAR | Status: DC | PRN

## 2018-12-28 MED ORDER — LORAZEPAM 0.5 MG PO TABS: 0.5000 mg | ORAL_TABLET | Freq: Four times a day (QID) | ORAL | Status: DC | PRN

## 2018-12-28 MED ORDER — NALTREXONE HCL 50 MG PO TABS
50.0000 mg | ORAL_TABLET | Freq: Every day | ORAL | Status: DC
  Administered 2018-12-28 – 2018-12-30 (×3): 50 mg via ORAL
  Filled 2018-12-28 (×4): qty 1

## 2018-12-28 MED ORDER — AMLODIPINE BESYLATE 5 MG PO TABS
5.0000 mg | ORAL_TABLET | Freq: Every day | ORAL | Status: DC
  Administered 2018-12-28 – 2018-12-29 (×2): 5 mg via ORAL
  Filled 2018-12-28 (×2): qty 1

## 2018-12-28 MED ORDER — FUROSEMIDE 10 MG/ML IJ SOLN
40.0000 mg | Freq: Once | INTRAMUSCULAR | Status: AC
  Administered 2018-12-28: 10:00:00 40 mg via INTRAVENOUS
  Filled 2018-12-28: qty 4

## 2018-12-28 MED ORDER — KETOROLAC TROMETHAMINE 30 MG/ML IJ SOLN
30.0000 mg | Freq: Once | INTRAMUSCULAR | Status: AC
  Administered 2018-12-28: 17:00:00 30 mg via INTRAVENOUS
  Filled 2018-12-28: qty 1

## 2018-12-28 NOTE — Progress Notes (Signed)
Pt c/o worsening pain and eventually dry heaving. Pain med and anti-nausea given.
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
## 2018-12-28 NOTE — Progress Notes (Signed)
[facility] [place] at [facility]   PATIENT NAME: Stephanie Levy    MR#:  [id number]  DATE OF BIRTH:  [birth year]  SUBJECTIVE:  CHIEF COMPLAINT:   Chief Complaint  Patient presents with  . Emesis   Abd pain and nausea are better but present  REVIEW OF SYSTEMS:    Review of Systems  Constitutional: Positive for malaise/fatigue. Negative for chills and fever.  HENT: Negative for sore throat.   Eyes: Negative for blurred vision, double vision and pain.  Respiratory: Negative for cough, hemoptysis, shortness of breath and wheezing.   Cardiovascular: Negative for chest pain, palpitations, orthopnea and leg swelling.  Gastrointestinal: Positive for abdominal pain and nausea. Negative for constipation, diarrhea, heartburn and vomiting.  Genitourinary: Negative for dysuria and hematuria.  Musculoskeletal: Negative for back pain and joint pain.  Skin: Negative for rash.  Neurological: Negative for sensory change, speech change, focal weakness and headaches.  Endo/Heme/Allergies: Does not bruise/bleed easily.  Psychiatric/Behavioral: Negative for depression. The patient is nervous/anxious.     DRUG ALLERGIES:   Allergies  Allergen Reactions  . Peanut-Containing Drug Products Anaphylaxis  . Amoxicillin Other (See Comments)    Gi distress  . Citrus Rash    VITALS:  Blood pressure (!) 134/94, pulse 91, temperature 98.7 F (37.1 C), temperature source Oral, resp. rate 18, height 5\' 8"  (1.727 m), weight 103.6 kg, last menstrual period 11/25/2018, SpO2 92 %.  PHYSICAL EXAMINATION:   Physical Exam  GENERAL:  38 y.o.-year-old patient lying in the bed . EYES: Pupils equal, round, reactive to light and accommodation. No scleral icterus. Extraocular muscles intact.  HEENT: Head atraumatic, normocephalic. Oropharynx and nasopharynx clear.  NECK:  Supple, no jugular venous distention. No thyroid enlargement, no tenderness.  LUNGS: Normal breath sounds bilaterally, no  wheezing, rales, rhonchi. No use of accessory muscles of respiration.  CARDIOVASCULAR: S1, S2 normal. No murmurs, rubs, or gallops.  ABDOMEN: Soft, diffusely tender, nondistended. Bowel sounds present. No organomegaly or mass.  EXTREMITIES: No cyanosis, clubbing or edema b/l.    NEUROLOGIC: Cranial nerves II through XII are intact. No focal Motor or sensory deficits b/l.   PSYCHIATRIC: The patient is alert and oriented x 3.  Anxious SKIN: No obvious rash, lesion, or ulcer.   LABORATORY PANEL:   CBC Recent Labs  Lab 12/28/18 0315  WBC 9.7  HGB 9.7*  HCT 30.8*  PLT 232   ------------------------------------------------------------------------------------------------------------------ Chemistries  Recent Labs  Lab 12/28/18 0315  NA 135  K 3.9  CL 97*  CO2 25  GLUCOSE 195*  BUN 8  CREATININE 0.58  CALCIUM 8.5*  MG 1.8  AST 62*  ALT 111*  ALKPHOS 150*  BILITOT 1.1   ------------------------------------------------------------------------------------------------------------------  Cardiac Enzymes No results for input(s): TROPONINI in the last 168 hours. ------------------------------------------------------------------------------------------------------------------  RADIOLOGY:  Dg Chest 2 View  Result Date: 12/27/2018 CLINICAL DATA:  Difficulty breathing today, patient states pancreatitis, history diabetes mellitus, hypertension, ethanol abuse EXAM: CHEST - 2 VIEW COMPARISON:  10/13/2017 FINDINGS: Borderline enlargement of cardiac silhouette. Low lung volumes with BILATERAL pulmonary infiltrates and minimal basilar atelectasis. No pleural effusion or pneumothorax. Bones unremarkable. IMPRESSION: Scattered BILATERAL pulmonary infiltrates and minimal basilar atelectasis. Electronically Signed   By: Ulyses Southward M.D.   On: 12/27/2018 15:57     ASSESSMENT AND PLAN:  *Acute hypoxic respiratory failure Saturations 85% on room air overnight.  Now on 2 L oxygen.  Decreased  breath sounds at the bases.  Will check chest x-ray now.  Incentive  spirometer added.  No wheezing.  Afebrile.  Suspect atelectasis or pleural effusions.   * Acute Alcoholic pancreatitis Lipase level trending down but patient still has pain No vomiting.   Advance to soft diet Pain medications as needed. Triglycerides normal.  Ultrasound showed no gallstones. Counseled to quit alcohol.  * HTN (hypertension) Added metoprolol  *Diabetes mellitus.  Sliding scale insulin added.  Hemoglobin A1c 10. Lantus added   * Alcohol abuse -CIWA protocol Withdrawals resolved  *Transaminitis secondary to alcohol.   Improving Fatty liver on ultrasound   * Anxiety/depression Psychiatry consulted.  Appreciate input. On Prozac.  *Hyponatremia secondary to dehydration.   Improved  *Hypokalemia.  Replaced  DVT prophylaxis with Lovenox  All the records are reviewed and case discussed with Care Management/Social Worker Management plans discussed with the patient, family and they are in agreement.  CODE STATUS: FULL CODE  TOTAL TIME TAKING CARE OF THIS PATIENT: 35 minutes.   POSSIBLE D/C IN 1-2 DAYS, DEPENDING ON CLINICAL CONDITION.  Molinda Bailiff Sarajean Dessert M.D on 12/28/2018 at 2:20 PM  Between 7am to 6pm - Pager - [phone]  After 6pm go to [email or web address] - password [email or web address]  [facility]  Office  [phone]  CC: Primary care physician; System, Pcp Not In  Note: This dictation was prepared with Dragon dictation along with smaller phrase technology. Any transcriptional errors that result from this process are unintentional.

## 2018-12-29 LAB — COMPREHENSIVE METABOLIC PANEL
ALBUMIN: 2.5 g/dL — AB (ref 3.5–5.0)
ALT: 65 U/L — ABNORMAL HIGH (ref 0–44)
AST: 31 U/L (ref 15–41)
Alkaline Phosphatase: 131 U/L — ABNORMAL HIGH (ref 38–126)
Anion gap: 13 (ref 5–15)
BUN: 8 mg/dL (ref 6–20)
CO2: 24 mmol/L (ref 22–32)
Calcium: 8.7 mg/dL — ABNORMAL LOW (ref 8.9–10.3)
Chloride: 98 mmol/L (ref 98–111)
Creatinine, Ser: 0.56 mg/dL (ref 0.44–1.00)
GFR calc Af Amer: >60 mL/min (ref >60)
GFR calc non Af Amer: >60 mL/min (ref >60)
GLUCOSE: 189 mg/dL — AB (ref 70–99)
Potassium: 3 mmol/L — ABNORMAL LOW (ref 3.5–5.1)
SODIUM: 135 mmol/L (ref 135–145)
Total Bilirubin: 0.9 mg/dL (ref 0.3–1.2)
Total Protein: 6.6 g/dL (ref 6.5–8.1)

## 2018-12-29 LAB — GLUCOSE, CAPILLARY
Glucose-Capillary: 224 mg/dL — ABNORMAL HIGH (ref 70–99)
Glucose-Capillary: 168 mg/dL — ABNORMAL HIGH (ref 70–99)
Glucose-Capillary: 177 mg/dL — ABNORMAL HIGH (ref 70–99)
Glucose-Capillary: 251 mg/dL — ABNORMAL HIGH (ref 70–99)

## 2018-12-29 LAB — LIPASE, BLOOD: Lipase: 87 U/L — ABNORMAL HIGH (ref 11–51)

## 2018-12-29 MED ORDER — INSULIN GLARGINE 100 UNIT/ML ~~LOC~~ SOLN
16.0000 [IU] | Freq: Every day | SUBCUTANEOUS | Status: DC
  Administered 2018-12-29: 16 [IU] via SUBCUTANEOUS
  Filled 2018-12-29 (×2): qty 0.16

## 2018-12-29 MED ORDER — ALUM & MAG HYDROXIDE-SIMETH 200-200-20 MG/5ML PO SUSP
30.0000 mL | Freq: Four times a day (QID) | ORAL | Status: DC | PRN
  Administered 2018-12-29: 21:00:00 30 mL via ORAL
  Filled 2018-12-29: qty 30

## 2018-12-29 MED ORDER — ALUM & MAG HYDROXIDE-SIMETH 200-200-20 MG/5ML PO SUSP
30.0000 mL | Freq: Once | ORAL | Status: AC
  Administered 2018-12-29: 30 mL via ORAL
  Filled 2018-12-29: qty 30

## 2018-12-29 MED ORDER — AMLODIPINE BESYLATE 5 MG PO TABS
5.0000 mg | ORAL_TABLET | Freq: Once | ORAL | Status: AC
  Administered 2018-12-29: 5 mg via ORAL
  Filled 2018-12-29: qty 1

## 2018-12-29 MED ORDER — AMLODIPINE BESYLATE 10 MG PO TABS
10.0000 mg | ORAL_TABLET | Freq: Every day | ORAL | Status: DC
  Administered 2018-12-30: 10 mg via ORAL
  Filled 2018-12-29: qty 1
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
## 2018-12-29 NOTE — Plan of Care (Addendum)
Pt not tolerating soft diet.  Poor appetite. Had only few sips of chicken broth today. C/o nausea/abdominal pain x2 during the shift. Nausea and pain med given with improvement. Maalox for gas pain given with improvement.

## 2018-12-29 NOTE — Progress Notes (Signed)
[facility] [place] [facility]   PATIENT NAME: Stephanie Levy    MR#:  [id number]  DATE OF BIRTH:  [birth year]  SUBJECTIVE:  CHIEF COMPLAINT:   Chief Complaint  Patient presents with  . Emesis   Abd pain and nausea are better but present  REVIEW OF SYSTEMS:    Review of Systems  Constitutional: Positive for malaise/fatigue. Negative for chills and fever.  HENT: Negative for sore throat.   Eyes: Negative for blurred vision, double vision and pain.  Respiratory: Negative for cough, hemoptysis, shortness of breath and wheezing.   Cardiovascular: Negative for chest pain, palpitations, orthopnea and leg swelling.  Gastrointestinal: Positive for abdominal pain and nausea. Negative for constipation, diarrhea, heartburn and vomiting.  Genitourinary: Negative for dysuria and hematuria.  Musculoskeletal: Negative for back pain and joint pain.  Skin: Negative for rash.  Neurological: Negative for sensory change, speech change, focal weakness and headaches.  Endo/Heme/Allergies: Does not bruise/bleed easily.  Psychiatric/Behavioral: Negative for depression. The patient is nervous/anxious.     DRUG ALLERGIES:   Allergies  Allergen Reactions  . Peanut-Containing Drug Products Anaphylaxis  . Amoxicillin Other (See Comments)    Gi distress  . Citrus Rash    VITALS:  Blood pressure (!) 157/108, pulse 82, temperature 98.4 F (36.9 C), temperature source Oral, resp. rate 18, height 5\' 8"  (1.727 m), weight 100.9 kg, last menstrual period 11/25/2018, SpO2 95 %.  PHYSICAL EXAMINATION:   Physical Exam  GENERAL:  38 y.o.-year-old patient lying in the bed . EYES: Pupils equal, round, reactive to light and accommodation. No scleral icterus. Extraocular muscles intact.  HEENT: Head atraumatic, normocephalic. Oropharynx and nasopharynx clear.  NECK:  Supple, no jugular venous distention. No thyroid enlargement, no tenderness.  LUNGS: Normal breath sounds bilaterally, no  wheezing, rales, rhonchi. No use of accessory muscles of respiration.  CARDIOVASCULAR: S1, S2 normal. No murmurs, rubs, or gallops.  ABDOMEN: Soft, diffusely tender, nondistended. Bowel sounds present. No organomegaly or mass.  EXTREMITIES: No cyanosis, clubbing or edema b/l.    NEUROLOGIC: Cranial nerves II through XII are intact. No focal Motor or sensory deficits b/l.   PSYCHIATRIC: The patient is alert and oriented x 3.  Anxious SKIN: No obvious rash, lesion, or ulcer.   LABORATORY PANEL:   CBC Recent Labs  Lab 12/28/18 0315  WBC 9.7  HGB 9.7*  HCT 30.8*  PLT 232   ------------------------------------------------------------------------------------------------------------------ Chemistries  Recent Labs  Lab 12/28/18 0315  NA 135  K 3.9  CL 97*  CO2 25  GLUCOSE 195*  BUN 8  CREATININE 0.58  CALCIUM 8.5*  MG 1.8  AST 62*  ALT 111*  ALKPHOS 150*  BILITOT 1.1   ------------------------------------------------------------------------------------------------------------------  Cardiac Enzymes No results for input(s): TROPONINI in the last 168 hours. ------------------------------------------------------------------------------------------------------------------  RADIOLOGY:  Dg Chest 2 View  Result Date: 12/27/2018 CLINICAL DATA:  Difficulty breathing today, patient states pancreatitis, history diabetes mellitus, hypertension, ethanol abuse EXAM: CHEST - 2 VIEW COMPARISON:  10/13/2017 FINDINGS: Borderline enlargement of cardiac silhouette. Low lung volumes with BILATERAL pulmonary infiltrates and minimal basilar atelectasis. No pleural effusion or pneumothorax. Bones unremarkable. IMPRESSION: Scattered BILATERAL pulmonary infiltrates and minimal basilar atelectasis. Electronically Signed   By: Ulyses Southward M.D.   On: 12/27/2018 15:57     ASSESSMENT AND PLAN:  *Acute hypoxic respiratory failure Saturations 85% on room air overnight.  Now on 2 L oxygen.  Decreased  breath sounds at the bases.  Will check chest x-ray now.  Incentive  spirometer added.  No wheezing.  Afebrile.  Suspect atelectasis or pleural effusions.   * Acute Alcoholic pancreatitis Lipase level trending down but patient still has pain No vomiting.   Advance to soft diet Pain medications as needed. Triglycerides normal.  Ultrasound showed no gallstones. Counseled to quit alcohol.  Will repeat CMP and lipase.  Discharge once pain improves and patient tolerates food.   * HTN (hypertension) Added metoprolol  *Diabetes mellitus.  Sliding scale insulin added.  Hemoglobin A1c 10. Lantus added   * Alcohol abuse -CIWA protocol Withdrawals resolved Her on naltrexone by psychiatry.  *Transaminitis secondary to alcohol.   Improving Fatty liver on ultrasound   * Anxiety/depression Psychiatry consulted.  Appreciate input. On Prozac.  *Hyponatremia secondary to dehydration.   Improved  *Hypokalemia.  Replaced  DVT prophylaxis with Lovenox  All the records are reviewed and case discussed with Care Management/Social Worker Management plans discussed with the patient, family and they are in agreement.  CODE STATUS: FULL CODE  TOTAL TIME TAKING CARE OF THIS PATIENT: 35 minutes.   POSSIBLE D/C IN 1-2 DAYS, DEPENDING ON CLINICAL CONDITION.  Molinda Bailiff Adi Seales M.D on 12/29/2018 at 12:58 PM  Between 7am to 6pm - Pager - [phone]  After 6pm go to [email or web address] - password [email or web address]  [facility] [place] [facility]  Office  [phone]  CC: Primary care physician; System, Pcp Not In  Note: This dictation was prepared with Dragon dictation along with smaller phrase technology. Any transcriptional errors that result from this process are unintentional.

## 2018-12-29 NOTE — Progress Notes (Addendum)
Inpatient Diabetes Program Recommendations  AACE/ADA: New Consensus Statement on Inpatient Glycemic Control   Target Ranges:  Prepandial:   less than 140 mg/dL      Peak postprandial:   less than 180 mg/dL (1-2 hours)      Critically ill patients:  140 - 180 mg/dL   Results for Stephanie Levy, Stephanie Levy (MRN [id number]) as of 12/29/2018 08:09  Ref. Range 12/28/2018 07:58 12/28/2018 11:39 12/28/2018 16:35 12/28/2018 21:33 12/29/2018 07:33  Glucose-Capillary Latest Ref Range: 70 - 99 mg/dL 814 (H) 481 (H) 856 (H) 203 (H) 224 (H)   Results for Stephanie Levy, Stephanie Levy (MRN [id number]) as of 12/29/2018 08:09  Ref. 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  HgbA1C: A1C 10% on 3/23/20indicating an average glucose of 240 mg/dlover the past 2-3 months. Per Care Everywhere, patient was inpatient at [facility] from 09/02/18 to 12/1/19for ETOH pancreatitis and A1C was noted to be 10.2%and patient was not discharged on any DM medications.Patient will need outpatient DM medication prescribed at discharge.  Oral DM medications: Due to ETOH use and recurrent pancreatitis, oral DM medication options are limited. Concerned about increased risk of hypoglycemia with Sulfonylureas (such a Amaryl), increased risk of lactic acidosis with Biguanides (Metformin), and concern for pancreatitis with DPP-4 inhibitors. Would also be concerned about increased of hypoglycemia with insulin especially it patient continues to drink alcohol.  Please consider discharging patient on SGLT2 inhibitor such as Invokana 100 mg daily.  Patient will need to follow up with PCP regarding DM control.  Spoke with patient over the phone regarding Invokana (how it works and side effects) in case patient is discharged on the medication.  Thanks, Orlando Penner, RN, MSN, CDE Diabetes Coordinator Inpatient Diabetes Program [phone] (Team Pager from 8am to 5pm)

## 2018-12-30 LAB — BASIC METABOLIC PANEL
Anion gap: 11 (ref 5–15)
BUN: 8 mg/dL (ref 6–20)
CO2: 27 mmol/L (ref 22–32)
CREATININE: 0.59 mg/dL (ref 0.44–1.00)
Calcium: 8.9 mg/dL (ref 8.9–10.3)
Chloride: 95 mmol/L — ABNORMAL LOW (ref 98–111)
GFR calc Af Amer: >60 mL/min (ref >60)
GFR calc non Af Amer: >60 mL/min (ref >60)
Glucose, Bld: 251 mg/dL — ABNORMAL HIGH (ref 70–99)
Potassium: 3.1 mmol/L — ABNORMAL LOW (ref 3.5–5.1)
Sodium: 133 mmol/L — ABNORMAL LOW (ref 135–145)

## 2018-12-30 LAB — LIPASE, BLOOD: Lipase: 102 U/L — ABNORMAL HIGH (ref 11–51)

## 2018-12-30 LAB — GLUCOSE, CAPILLARY: Glucose-Capillary: 224 mg/dL — ABNORMAL HIGH (ref 70–99)

## 2018-12-30 MED ORDER — INSULIN GLARGINE 100 UNIT/ML ~~LOC~~ SOLN: 16.0000 [IU] | Freq: Every day | SUBCUTANEOUS | 11 refills | Status: DC

## 2018-12-30 MED ORDER — AMLODIPINE BESYLATE 10 MG PO TABS: 10.0000 mg | ORAL_TABLET | Freq: Every day | ORAL | 0 refills | Status: DC

## 2018-12-30 MED ORDER — BLOOD GLUCOSE MONITOR KIT: PACK | 0 refills | Status: AC

## 2018-12-30 MED ORDER — POTASSIUM CHLORIDE CRYS ER 20 MEQ PO TBCR
40.0000 meq | EXTENDED_RELEASE_TABLET | Freq: Once | ORAL | Status: AC
  Administered 2018-12-30: 11:00:00 40 meq via ORAL
  Filled 2018-12-30: qty 2

## 2018-12-30 MED ORDER — NALTREXONE HCL 50 MG PO TABS: 50.0000 mg | ORAL_TABLET | Freq: Every day | ORAL | 0 refills | Status: AC

## 2018-12-30 MED ORDER — FLUOXETINE HCL 20 MG/5ML PO SOLN: 40.0000 mg | Freq: Every day | ORAL | 3 refills | Status: DC

## 2018-12-30 NOTE — Progress Notes (Signed)
Pt is being discharged home.  Discharge papers given and explained to pt.  Pt verbalized understanding.  Meds and f/u appointment reviewed. Rx and work note given. Awaiting transportation.
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
## 2018-12-30 NOTE — Plan of Care (Signed)

## 2019-08-25 ENCOUNTER — Emergency Department
Admission: EM | Admit: 2019-08-25 | Discharge: 2019-08-25 | Disposition: A | Discharge status: Home / Self Care | Payer: BC Managed Care – PPO | Attending: Emergency Medicine | Admitting: Emergency Medicine

## 2019-08-25 ENCOUNTER — Emergency Department: Payer: BC Managed Care – PPO

## 2019-08-25 ENCOUNTER — Other Ambulatory Visit: Payer: Self-pay

## 2019-08-25 ENCOUNTER — Encounter: Payer: Self-pay | Admitting: Intensive Care

## 2019-08-25 DIAGNOSIS — Z91010 Allergy to peanuts: Secondary | ICD-10-CM | POA: Diagnosis not present

## 2019-08-25 DIAGNOSIS — W182XXA Fall in (into) shower or empty bathtub, initial encounter: Secondary | ICD-10-CM | POA: Insufficient documentation

## 2019-08-25 DIAGNOSIS — Y939 Activity, unspecified: Secondary | ICD-10-CM | POA: Diagnosis not present

## 2019-08-25 DIAGNOSIS — Y999 Unspecified external cause status: Secondary | ICD-10-CM | POA: Diagnosis not present

## 2019-08-25 DIAGNOSIS — S299XXA Unspecified injury of thorax, initial encounter: Secondary | ICD-10-CM | POA: Diagnosis present

## 2019-08-25 DIAGNOSIS — Z79899 Other long term (current) drug therapy: Secondary | ICD-10-CM | POA: Diagnosis not present

## 2019-08-25 DIAGNOSIS — S20211A Contusion of right front wall of thorax, initial encounter: Secondary | ICD-10-CM | POA: Diagnosis not present

## 2019-08-25 DIAGNOSIS — I10 Essential (primary) hypertension: Secondary | ICD-10-CM | POA: Diagnosis not present

## 2019-08-25 DIAGNOSIS — Y929 Unspecified place or not applicable: Secondary | ICD-10-CM | POA: Diagnosis not present

## 2019-08-25 DIAGNOSIS — M5410 Radiculopathy, site unspecified: Secondary | ICD-10-CM | POA: Insufficient documentation

## 2019-08-25 LAB — CBC WITH DIFFERENTIAL/PLATELET
Abs Immature Granulocytes: 0.01 10*3/uL (ref 0.00–0.07)
Basophils Absolute: 0.1 10*3/uL (ref 0.0–0.1)
Basophils Relative: 1 %
Eosinophils Absolute: 0 10*3/uL (ref 0.0–0.5)
Eosinophils Relative: 1 %
HCT: 34.2 % — ABNORMAL LOW (ref 36.0–46.0)
Hemoglobin: 11.2 g/dL — ABNORMAL LOW (ref 12.0–15.0)
Immature Granulocytes: 0 %
Lymphocytes Relative: 51 %
Lymphs Abs: 2.5 10*3/uL (ref 0.7–4.0)
MCH: 28.1 pg (ref 26.0–34.0)
MCHC: 32.7 g/dL (ref 30.0–36.0)
MCV: 85.7 fL (ref 80.0–100.0)
Monocytes Absolute: 0.4 10*3/uL (ref 0.1–1.0)
Monocytes Relative: 9 %
Neutro Abs: 1.8 10*3/uL (ref 1.7–7.7)
Neutrophils Relative %: 38 %
Platelets: 323 10*3/uL (ref 150–400)
RBC: 3.99 MIL/uL (ref 3.87–5.11)
RDW: 15.4 % (ref 11.5–15.5)
WBC: 4.8 10*3/uL (ref 4.0–10.5)
nRBC: 0 % (ref 0.0–0.2)

## 2019-08-25 LAB — COMPREHENSIVE METABOLIC PANEL
ALT: 36 U/L (ref 0–44)
AST: 51 U/L — ABNORMAL HIGH (ref 15–41)
Albumin: 3.5 g/dL (ref 3.5–5.0)
Alkaline Phosphatase: 83 U/L (ref 38–126)
Anion gap: 19 — ABNORMAL HIGH (ref 5–15)
BUN: 9 mg/dL (ref 6–20)
CO2: 19 mmol/L — ABNORMAL LOW (ref 22–32)
Calcium: 8.6 mg/dL — ABNORMAL LOW (ref 8.9–10.3)
Chloride: 100 mmol/L (ref 98–111)
Creatinine, Ser: 0.62 mg/dL (ref 0.44–1.00)
GFR calc Af Amer: >60 mL/min (ref >60)
GFR calc non Af Amer: >60 mL/min (ref >60)
Glucose, Bld: 281 mg/dL — ABNORMAL HIGH (ref 70–99)
Potassium: 3.7 mmol/L (ref 3.5–5.1)
Sodium: 138 mmol/L (ref 135–145)
Total Bilirubin: 0.7 mg/dL (ref 0.3–1.2)
Total Protein: 7.1 g/dL (ref 6.5–8.1)

## 2019-08-25 MED ORDER — KETOROLAC TROMETHAMINE 10 MG PO TABS: 10.0000 mg | ORAL_TABLET | Freq: Four times a day (QID) | ORAL | 0 refills | Status: DC | PRN

## 2019-08-25 MED ORDER — TIZANIDINE HCL 4 MG PO TABS: 4.0000 mg | ORAL_TABLET | Freq: Three times a day (TID) | ORAL | 0 refills | Status: AC | PRN

## 2019-08-25 MED ORDER — OXYCODONE HCL 5 MG PO TABS
5.0000 mg | ORAL_TABLET | Freq: Once | ORAL | Status: AC
  Administered 2019-08-25: 5 mg via ORAL
  Filled 2019-08-25: qty 1

## 2019-08-25 MED ORDER — KETOROLAC TROMETHAMINE 30 MG/ML IJ SOLN
30.0000 mg | Freq: Once | INTRAMUSCULAR | Status: AC
  Administered 2019-08-25: 30 mg via INTRAMUSCULAR
  Filled 2019-08-25: qty 1

## 2019-08-25 NOTE — Discharge Instructions (Signed)
Return to the ER for sudden onset of shortness of breath or other symptoms of concern.

## 2019-08-25 NOTE — ED Notes (Signed)
First Nurse Note: Pt to ED via POV stating that she fell out of the shower hurting her right side. Pt states that she is unable to breath well, however, pt is speaking in complete sentences at this time and is showing no signs of respiratory distress. Pt got frustrated while waiting in line, yelling "Jesus fucking christ". This RN informed pt that that type of language was not necessary and that we were working as quick as we could.

## 2019-08-25 NOTE — ED Triage Notes (Signed)
Patient reports she slipped and fell in her bathroom and hit her back on the toilet. C/o right sided mid back pain and right leg numbness. Also c/o trouble/pain breathing. HX back surgery with rod. Patient reports she is unsure whether she passed out or not. Denies hitting head
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
## 2019-12-25 ENCOUNTER — Emergency Department: Payer: BC Managed Care – PPO

## 2019-12-25 ENCOUNTER — Other Ambulatory Visit: Payer: Self-pay

## 2019-12-25 ENCOUNTER — Encounter: Payer: Self-pay | Admitting: Emergency Medicine

## 2019-12-25 ENCOUNTER — Emergency Department
Admission: EM | Admit: 2019-12-25 | Discharge: 2019-12-25 | Disposition: A | Discharge status: Home / Self Care | Payer: BC Managed Care – PPO | Attending: Emergency Medicine | Admitting: Emergency Medicine

## 2019-12-25 DIAGNOSIS — Y939 Activity, unspecified: Secondary | ICD-10-CM | POA: Insufficient documentation

## 2019-12-25 DIAGNOSIS — Z9049 Acquired absence of other specified parts of digestive tract: Secondary | ICD-10-CM | POA: Diagnosis not present

## 2019-12-25 DIAGNOSIS — S8001XA Contusion of right knee, initial encounter: Secondary | ICD-10-CM | POA: Insufficient documentation

## 2019-12-25 DIAGNOSIS — Y999 Unspecified external cause status: Secondary | ICD-10-CM | POA: Insufficient documentation

## 2019-12-25 DIAGNOSIS — Y929 Unspecified place or not applicable: Secondary | ICD-10-CM | POA: Insufficient documentation

## 2019-12-25 DIAGNOSIS — W0110XA Fall on same level from slipping, tripping and stumbling with subsequent striking against unspecified object, initial encounter: Secondary | ICD-10-CM | POA: Insufficient documentation

## 2019-12-25 DIAGNOSIS — S8001XS Contusion of right knee, sequela: Secondary | ICD-10-CM

## 2019-12-25 DIAGNOSIS — Z9884 Bariatric surgery status: Secondary | ICD-10-CM | POA: Diagnosis not present

## 2019-12-25 DIAGNOSIS — M25561 Pain in right knee: Secondary | ICD-10-CM

## 2019-12-25 DIAGNOSIS — Z79899 Other long term (current) drug therapy: Secondary | ICD-10-CM | POA: Insufficient documentation

## 2019-12-25 DIAGNOSIS — Z794 Long term (current) use of insulin: Secondary | ICD-10-CM | POA: Insufficient documentation

## 2019-12-25 DIAGNOSIS — Z91010 Allergy to peanuts: Secondary | ICD-10-CM | POA: Insufficient documentation

## 2019-12-25 DIAGNOSIS — I10 Essential (primary) hypertension: Secondary | ICD-10-CM | POA: Diagnosis not present

## 2019-12-25 DIAGNOSIS — S8991XA Unspecified injury of right lower leg, initial encounter: Secondary | ICD-10-CM | POA: Diagnosis present

## 2019-12-25 MED ORDER — TRAMADOL HCL 50 MG PO TABS
50.0000 mg | ORAL_TABLET | Freq: Once | ORAL | Status: AC
  Administered 2019-12-25: 50 mg via ORAL
  Filled 2019-12-25: qty 1

## 2019-12-25 NOTE — ED Notes (Addendum)
Pt behavior is odd. Pt is anxious when this RN in the room but calm and quiet when alone. Pt states knee brace of no use and tramadol never helps. Advised pt to follow up with ortho asap. Pt ambulatory.
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
## 2019-12-25 NOTE — Discharge Instructions (Addendum)
Follow-up with your regular doctor or [facility] clinic orthopedics. Please call for an appointment apply ice 3 times daily for about 15 minutes to decrease the amount of swelling and inflammation. Return to the emergency department if worsening

## 2019-12-25 NOTE — ED Triage Notes (Signed)
FIRST NURSE NOTE: Pt reports slipped and fell onto right knee. Pt provided wheelchair on arrival. Pt has mask in place.

## 2019-12-25 NOTE — ED Triage Notes (Signed)
Patient ambulatory to triage with steady gait, without difficulty or distress noted, mask in place; pt reports PTA tripped on step and fell on rt knee; c/o persistent pain; denies any other c/o or injuries

## 2020-05-07 ENCOUNTER — Ambulatory Visit
Admission: RE | Admit: 2020-05-07 | Discharge: 2020-05-07 | Disposition: A | Discharge status: Home / Self Care | Payer: BC Managed Care – PPO | Source: Ambulatory Visit | Attending: Emergency Medicine | Admitting: Emergency Medicine

## 2020-05-07 ENCOUNTER — Other Ambulatory Visit: Payer: Self-pay

## 2020-05-07 VITALS — BP 107/73 | HR 137 | Temp 98.9°F | Resp 18

## 2020-05-07 DIAGNOSIS — R197 Diarrhea, unspecified: Secondary | ICD-10-CM

## 2020-05-07 DIAGNOSIS — E860 Dehydration: Secondary | ICD-10-CM

## 2020-05-07 LAB — POC SARS CORONAVIRUS 2 AG -  ED: SARS Coronavirus 2 Ag: NEGATIVE

## 2020-05-07 MED ORDER — SODIUM CHLORIDE 0.9 % IV BOLUS
1000.0000 mL | Freq: Once | INTRAVENOUS | Status: AC
  Administered 2020-05-07: 1000 mL via INTRAVENOUS

## 2020-05-07 NOTE — Discharge Instructions (Addendum)
Keep yourself hydrated with clear liquids.  Start the diarrhea diet as tolerated.  See the attached handout.     Your rapid Covid test is negative.  The send out is pending.  You should self quarantine until the test result is back.    Go to the emergency department if you have acute worsening symptoms.

## 2020-05-07 NOTE — ED Triage Notes (Signed)
Patient reports diarrhea and nausea/vomiting for the last few days. Also endorses fatigue and weakness.   Denies: cough, ShOB, chest pain
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
## 2020-05-08 ENCOUNTER — Telehealth: Payer: Self-pay | Admitting: Emergency Medicine

## 2020-05-08 LAB — COMPREHENSIVE METABOLIC PANEL
ALT: 18 IU/L (ref 0–32)
AST: 44 IU/L — ABNORMAL HIGH (ref 0–40)
Albumin/Globulin Ratio: 1.2 (ref 1.2–2.2)
Albumin: 3.2 g/dL — ABNORMAL LOW (ref 3.8–4.8)
Alkaline Phosphatase: 88 IU/L (ref 48–121)
BUN/Creatinine Ratio: 13 (ref 9–23)
BUN: 8 mg/dL (ref 6–20)
Bilirubin Total: 0.2 mg/dL (ref 0.0–1.2)
CO2: 17 mmol/L — ABNORMAL LOW (ref 20–29)
Calcium: 8.4 mg/dL — ABNORMAL LOW (ref 8.7–10.2)
Chloride: 99 mmol/L (ref 96–106)
Creatinine, Ser: 0.64 mg/dL (ref 0.57–1.00)
GFR calc Af Amer: 131 mL/min/{1.73_m2} (ref >59)
GFR calc non Af Amer: 114 mL/min/{1.73_m2} (ref >59)
Globulin, Total: 2.6 g/dL (ref 1.5–4.5)
Glucose: 212 mg/dL — ABNORMAL HIGH (ref 65–99)
Potassium: 3.4 mmol/L — ABNORMAL LOW (ref 3.5–5.2)
Sodium: 138 mmol/L (ref 134–144)
Total Protein: 5.8 g/dL — ABNORMAL LOW (ref 6.0–8.5)

## 2020-05-08 LAB — CBC
Hematocrit: 32.9 % — ABNORMAL LOW (ref 34.0–46.6)
Hemoglobin: 10.7 g/dL — ABNORMAL LOW (ref 11.1–15.9)
MCH: 27.2 pg (ref 26.6–33.0)
MCHC: 32.5 g/dL (ref 31.5–35.7)
MCV: 84 fL (ref 79–97)
Platelets: 273 10*3/uL (ref 150–450)
RBC: 3.93 x10E6/uL (ref 3.77–5.28)
RDW: 18.1 % — ABNORMAL HIGH (ref 11.7–15.4)
WBC: 3.7 10*3/uL (ref 3.4–10.8)

## 2020-05-08 LAB — SARS-COV-2, NAA 2 DAY TAT

## 2020-05-08 LAB — NOVEL CORONAVIRUS, NAA: SARS-CoV-2, NAA: NOT DETECTED

## 2020-05-08 NOTE — Telephone Encounter (Signed)
Left message for patient to call back regarding lab results.

## 2020-08-27 ENCOUNTER — Ambulatory Visit
Admission: EM | Admit: 2020-08-27 | Discharge: 2020-08-27 | Disposition: A | Discharge status: Home / Self Care | Payer: BC Managed Care – PPO | Attending: Emergency Medicine | Admitting: Emergency Medicine

## 2020-08-27 ENCOUNTER — Encounter: Payer: Self-pay | Admitting: Emergency Medicine

## 2020-08-27 ENCOUNTER — Other Ambulatory Visit: Payer: Self-pay

## 2020-08-27 DIAGNOSIS — J029 Acute pharyngitis, unspecified: Secondary | ICD-10-CM

## 2020-08-27 DIAGNOSIS — J0100 Acute maxillary sinusitis, unspecified: Secondary | ICD-10-CM | POA: Insufficient documentation

## 2020-08-27 DIAGNOSIS — R030 Elevated blood-pressure reading, without diagnosis of hypertension: Secondary | ICD-10-CM | POA: Diagnosis present

## 2020-08-27 LAB — POCT RAPID STREP A (OFFICE): Rapid Strep A Screen: NEGATIVE

## 2020-08-27 MED ORDER — AZITHROMYCIN 250 MG PO TABS: 250.0000 mg | ORAL_TABLET | Freq: Every day | ORAL | 0 refills | Status: DC
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
## 2020-08-27 NOTE — Discharge Instructions (Addendum)
Take the antibiotic as directed.  Follow up with your primary care provider if your symptoms are not improving.    Your blood pressure is elevated today at 142/104.  Please have this rechecked by your primary care provider in 2-4 weeks.

## 2020-08-27 NOTE — ED Triage Notes (Signed)
Patient c/o bilateral ear pain, nasal congestion, and sore throat x 1 week.  Patient denies fever.   Patient endorses cough and sputum production with a "greenish" color.   Patient has taken OTC cough and cold medication w/ some relief of symptoms.

## 2020-08-28 LAB — SARS-COV-2, NAA 2 DAY TAT

## 2020-08-28 LAB — NOVEL CORONAVIRUS, NAA: SARS-CoV-2, NAA: NOT DETECTED

## 2020-08-29 LAB — CULTURE, GROUP A STREP (THRC)

## 2020-10-08 ENCOUNTER — Encounter: Payer: Self-pay | Admitting: Emergency Medicine

## 2020-10-08 ENCOUNTER — Emergency Department: Payer: BC Managed Care – PPO

## 2020-10-08 ENCOUNTER — Emergency Department
Admission: EM | Admit: 2020-10-08 | Discharge: 2020-10-08 | Disposition: A | Discharge status: Home / Self Care | Payer: BC Managed Care – PPO | Attending: Emergency Medicine | Admitting: Emergency Medicine

## 2020-10-08 ENCOUNTER — Other Ambulatory Visit: Payer: Self-pay

## 2020-10-08 DIAGNOSIS — Z794 Long term (current) use of insulin: Secondary | ICD-10-CM | POA: Insufficient documentation

## 2020-10-08 DIAGNOSIS — M549 Dorsalgia, unspecified: Secondary | ICD-10-CM | POA: Diagnosis present

## 2020-10-08 DIAGNOSIS — Z91010 Allergy to peanuts: Secondary | ICD-10-CM | POA: Insufficient documentation

## 2020-10-08 DIAGNOSIS — I10 Essential (primary) hypertension: Secondary | ICD-10-CM | POA: Insufficient documentation

## 2020-10-08 DIAGNOSIS — Z79899 Other long term (current) drug therapy: Secondary | ICD-10-CM | POA: Insufficient documentation

## 2020-10-08 DIAGNOSIS — M545 Low back pain, unspecified: Secondary | ICD-10-CM | POA: Insufficient documentation

## 2020-10-08 DIAGNOSIS — E119 Type 2 diabetes mellitus without complications: Secondary | ICD-10-CM | POA: Insufficient documentation

## 2020-10-08 MED ORDER — LIDOCAINE 5 % EX PTCH
1.0000 | MEDICATED_PATCH | CUTANEOUS | Status: DC
  Administered 2020-10-08: 1 via TRANSDERMAL
  Filled 2020-10-08: qty 1

## 2020-10-08 MED ORDER — METHOCARBAMOL 500 MG PO TABS: 500.0000 mg | ORAL_TABLET | Freq: Four times a day (QID) | ORAL | 0 refills | Status: DC

## 2020-10-08 MED ORDER — LIDOCAINE 5 % EX PTCH: 1.0000 | MEDICATED_PATCH | Freq: Two times a day (BID) | CUTANEOUS | 0 refills | Status: AC

## 2020-10-08 MED ORDER — MELOXICAM 7.5 MG PO TABS: 7.5000 mg | ORAL_TABLET | Freq: Every day | ORAL | 0 refills | Status: DC

## 2020-10-08 MED ORDER — KETOROLAC TROMETHAMINE 30 MG/ML IJ SOLN
30.0000 mg | Freq: Once | INTRAMUSCULAR | Status: AC
  Administered 2020-10-08: 30 mg via INTRAMUSCULAR
  Filled 2020-10-08: qty 1

## 2020-10-08 MED ORDER — MELOXICAM 7.5 MG PO TABS: 7.5000 mg | ORAL_TABLET | Freq: Every day | ORAL | 0 refills | Status: AC
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
## 2020-10-08 NOTE — ED Notes (Signed)
NAD noted at time of D/C. Pt denies questions or concerns. Pt ambulatory to the lobby at this time. Verbal consent for D/C obtained at this time.  

## 2020-10-08 NOTE — ED Triage Notes (Signed)
Pt to ED via POV with c/o R lower back that radiates into her leg after a fall "around christmas". Pt states hx of multiple back surgeries including a spinal fusion. Pt states also chipped her tooth when she fell. Pt A&O x4, ambualtory from lobby to treatment room.

## 2022-04-02 ENCOUNTER — Ambulatory Visit
Admission: RE | Admit: 2022-04-02 | Discharge: 2022-04-02 | Disposition: A | Discharge status: Other Acute Inpatient Hospital | Payer: BC Managed Care – PPO | Source: Ambulatory Visit

## 2022-04-02 ENCOUNTER — Inpatient Hospital Stay
Admission: EM | Admit: 2022-04-02 | Discharge: 2022-04-06 | DRG: 602 | Disposition: A | Discharge status: Home / Self Care | Payer: BC Managed Care – PPO | Attending: Internal Medicine | Admitting: Internal Medicine

## 2022-04-02 ENCOUNTER — Emergency Department: Payer: BC Managed Care – PPO

## 2022-04-02 ENCOUNTER — Encounter: Payer: Self-pay | Admitting: Intensive Care

## 2022-04-02 ENCOUNTER — Other Ambulatory Visit: Payer: Self-pay

## 2022-04-02 VITALS — BP 125/90 | HR 103 | Temp 98.7°F | Resp 18

## 2022-04-02 DIAGNOSIS — F419 Anxiety disorder, unspecified: Secondary | ICD-10-CM | POA: Diagnosis present

## 2022-04-02 DIAGNOSIS — D649 Anemia, unspecified: Secondary | ICD-10-CM | POA: Diagnosis present

## 2022-04-02 DIAGNOSIS — E43 Unspecified severe protein-calorie malnutrition: Secondary | ICD-10-CM | POA: Diagnosis present

## 2022-04-02 DIAGNOSIS — E119 Type 2 diabetes mellitus without complications: Secondary | ICD-10-CM | POA: Diagnosis present

## 2022-04-02 DIAGNOSIS — L03116 Cellulitis of left lower limb: Secondary | ICD-10-CM | POA: Diagnosis present

## 2022-04-02 DIAGNOSIS — Z880 Allergy status to penicillin: Secondary | ICD-10-CM | POA: Diagnosis not present

## 2022-04-02 DIAGNOSIS — L03115 Cellulitis of right lower limb: Secondary | ICD-10-CM | POA: Diagnosis present

## 2022-04-02 DIAGNOSIS — F418 Other specified anxiety disorders: Secondary | ICD-10-CM | POA: Diagnosis present

## 2022-04-02 DIAGNOSIS — G4700 Insomnia, unspecified: Secondary | ICD-10-CM | POA: Diagnosis present

## 2022-04-02 DIAGNOSIS — Z91018 Allergy to other foods: Secondary | ICD-10-CM | POA: Diagnosis not present

## 2022-04-02 DIAGNOSIS — Z681 Body mass index (BMI) 19 or less, adult: Secondary | ICD-10-CM | POA: Diagnosis not present

## 2022-04-02 DIAGNOSIS — Z79899 Other long term (current) drug therapy: Secondary | ICD-10-CM

## 2022-04-02 DIAGNOSIS — I10 Essential (primary) hypertension: Secondary | ICD-10-CM | POA: Diagnosis present

## 2022-04-02 DIAGNOSIS — E440 Moderate protein-calorie malnutrition: Secondary | ICD-10-CM | POA: Insufficient documentation

## 2022-04-02 DIAGNOSIS — Z8249 Family history of ischemic heart disease and other diseases of the circulatory system: Secondary | ICD-10-CM

## 2022-04-02 DIAGNOSIS — F32A Depression, unspecified: Secondary | ICD-10-CM | POA: Diagnosis present

## 2022-04-02 DIAGNOSIS — K7031 Alcoholic cirrhosis of liver with ascites: Secondary | ICD-10-CM | POA: Diagnosis present

## 2022-04-02 DIAGNOSIS — E876 Hypokalemia: Secondary | ICD-10-CM | POA: Diagnosis present

## 2022-04-02 DIAGNOSIS — F909 Attention-deficit hyperactivity disorder, unspecified type: Secondary | ICD-10-CM | POA: Diagnosis present

## 2022-04-02 DIAGNOSIS — Z825 Family history of asthma and other chronic lower respiratory diseases: Secondary | ICD-10-CM | POA: Diagnosis not present

## 2022-04-02 DIAGNOSIS — L89152 Pressure ulcer of sacral region, stage 2: Secondary | ICD-10-CM | POA: Diagnosis present

## 2022-04-02 DIAGNOSIS — Z813 Family history of other psychoactive substance abuse and dependence: Secondary | ICD-10-CM

## 2022-04-02 DIAGNOSIS — L03119 Cellulitis of unspecified part of limb: Secondary | ICD-10-CM | POA: Diagnosis not present

## 2022-04-02 DIAGNOSIS — Z833 Family history of diabetes mellitus: Secondary | ICD-10-CM | POA: Diagnosis not present

## 2022-04-02 DIAGNOSIS — F1011 Alcohol abuse, in remission: Secondary | ICD-10-CM | POA: Diagnosis present

## 2022-04-02 DIAGNOSIS — R601 Generalized edema: Secondary | ICD-10-CM | POA: Diagnosis present

## 2022-04-02 DIAGNOSIS — Z91010 Allergy to peanuts: Secondary | ICD-10-CM

## 2022-04-02 DIAGNOSIS — Z9884 Bariatric surgery status: Secondary | ICD-10-CM | POA: Diagnosis not present

## 2022-04-02 DIAGNOSIS — F1010 Alcohol abuse, uncomplicated: Secondary | ICD-10-CM | POA: Diagnosis not present

## 2022-04-02 DIAGNOSIS — Z811 Family history of alcohol abuse and dependence: Secondary | ICD-10-CM

## 2022-04-02 DIAGNOSIS — R600 Localized edema: Secondary | ICD-10-CM

## 2022-04-02 DIAGNOSIS — Z794 Long term (current) use of insulin: Secondary | ICD-10-CM

## 2022-04-02 DIAGNOSIS — L8990 Pressure ulcer of unspecified site, unspecified stage: Secondary | ICD-10-CM | POA: Insufficient documentation

## 2022-04-02 DIAGNOSIS — L989 Disorder of the skin and subcutaneous tissue, unspecified: Secondary | ICD-10-CM | POA: Diagnosis present

## 2022-04-02 LAB — AMMONIA: Ammonia: 38 umol/L — ABNORMAL HIGH (ref 9–35)

## 2022-04-02 LAB — URINALYSIS, COMPLETE (UACMP) WITH MICROSCOPIC
Bacteria, UA: NONE SEEN
Glucose, UA: NEGATIVE mg/dL
Hgb urine dipstick: NEGATIVE
Ketones, ur: NEGATIVE mg/dL
Leukocytes,Ua: NEGATIVE
Nitrite: NEGATIVE
Protein, ur: 30 mg/dL — AB
Specific Gravity, Urine: 1.029 (ref 1.005–1.030)
pH: 5 (ref 5.0–8.0)

## 2022-04-02 LAB — PROTIME-INR
INR: 1.2 (ref 0.8–1.2)
Prothrombin Time: 14.7 seconds (ref 11.4–15.2)

## 2022-04-02 LAB — CBC WITH DIFFERENTIAL/PLATELET
Abs Immature Granulocytes: 0.02 10*3/uL (ref 0.00–0.07)
Basophils Absolute: 0.1 10*3/uL (ref 0.0–0.1)
Basophils Relative: 1 %
Eosinophils Absolute: 0.1 10*3/uL (ref 0.0–0.5)
Eosinophils Relative: 1 %
HCT: 29.9 % — ABNORMAL LOW (ref 36.0–46.0)
Hemoglobin: 9.6 g/dL — ABNORMAL LOW (ref 12.0–15.0)
Immature Granulocytes: 0 %
Lymphocytes Relative: 38 %
Lymphs Abs: 2.8 10*3/uL (ref 0.7–4.0)
MCH: 28 pg (ref 26.0–34.0)
MCHC: 32.1 g/dL (ref 30.0–36.0)
MCV: 87.2 fL (ref 80.0–100.0)
Monocytes Absolute: 0.6 10*3/uL (ref 0.1–1.0)
Monocytes Relative: 8 %
Neutro Abs: 3.9 10*3/uL (ref 1.7–7.7)
Neutrophils Relative %: 52 %
Platelets: 314 10*3/uL (ref 150–400)
RBC: 3.43 MIL/uL — ABNORMAL LOW (ref 3.87–5.11)
RDW: 18.2 % — ABNORMAL HIGH (ref 11.5–15.5)
WBC: 7.4 10*3/uL (ref 4.0–10.5)
nRBC: 0 % (ref 0.0–0.2)

## 2022-04-02 LAB — COMPREHENSIVE METABOLIC PANEL
ALT: 31 U/L (ref 0–44)
AST: 41 U/L (ref 15–41)
Albumin: 1.8 g/dL — ABNORMAL LOW (ref 3.5–5.0)
Alkaline Phosphatase: 327 U/L — ABNORMAL HIGH (ref 38–126)
Anion gap: 8 (ref 5–15)
BUN: 14 mg/dL (ref 6–20)
CO2: 27 mmol/L (ref 22–32)
Calcium: 8.3 mg/dL — ABNORMAL LOW (ref 8.9–10.3)
Chloride: 103 mmol/L (ref 98–111)
Creatinine, Ser: 0.84 mg/dL (ref 0.44–1.00)
GFR, Estimated: >60 mL/min (ref >60)
Glucose, Bld: 188 mg/dL — ABNORMAL HIGH (ref 70–99)
Potassium: 2.9 mmol/L — ABNORMAL LOW (ref 3.5–5.1)
Sodium: 138 mmol/L (ref 135–145)
Total Bilirubin: 1.4 mg/dL — ABNORMAL HIGH (ref 0.3–1.2)
Total Protein: 6.3 g/dL — ABNORMAL LOW (ref 6.5–8.1)

## 2022-04-02 LAB — T4, FREE: Free T4: 1.25 ng/dL — ABNORMAL HIGH (ref 0.61–1.12)

## 2022-04-02 LAB — MAGNESIUM: Magnesium: 1.7 mg/dL (ref 1.7–2.4)

## 2022-04-02 LAB — TSH: TSH: 3.422 u[IU]/mL (ref 0.350–4.500)

## 2022-04-02 LAB — PHOSPHORUS: Phosphorus: 3.4 mg/dL (ref 2.5–4.6)

## 2022-04-02 LAB — HCG, QUANTITATIVE, PREGNANCY: hCG, Beta Chain, Quant, S: 1 m[IU]/mL (ref <5)

## 2022-04-02 LAB — SEDIMENTATION RATE: Sed Rate: 20 mm/hr (ref 0–20)

## 2022-04-02 LAB — APTT: aPTT: 29 seconds (ref 24–36)

## 2022-04-02 LAB — GLUCOSE, CAPILLARY: Glucose-Capillary: 146 mg/dL — ABNORMAL HIGH (ref 70–99)

## 2022-04-02 LAB — BRAIN NATRIURETIC PEPTIDE: B Natriuretic Peptide: 168.3 pg/mL — ABNORMAL HIGH (ref 0.0–100.0)

## 2022-04-02 MED ORDER — ACETAMINOPHEN 325 MG PO TABS: 650.0000 mg | ORAL_TABLET | Freq: Four times a day (QID) | ORAL | Status: DC | PRN

## 2022-04-02 MED ORDER — SODIUM CHLORIDE 0.9 % IV BOLUS
1000.0000 mL | Freq: Once | INTRAVENOUS | Status: AC
  Administered 2022-04-02: 1000 mL via INTRAVENOUS

## 2022-04-02 MED ORDER — INSULIN ASPART 100 UNIT/ML IJ SOLN: 0.0000 [IU] | Freq: Every day | INTRAMUSCULAR | Status: DC

## 2022-04-02 MED ORDER — ACETAMINOPHEN 500 MG PO TABS: 1000.0000 mg | ORAL_TABLET | Freq: Four times a day (QID) | ORAL | Status: DC | PRN

## 2022-04-02 MED ORDER — ONDANSETRON HCL 4 MG/2ML IJ SOLN
4.0000 mg | Freq: Three times a day (TID) | INTRAMUSCULAR | Status: DC | PRN
  Administered 2022-04-05 – 2022-04-06 (×2): 4 mg via INTRAVENOUS
  Filled 2022-04-02 (×2): qty 2

## 2022-04-02 MED ORDER — CLONAZEPAM 0.5 MG PO TBDP
0.5000 mg | ORAL_TABLET | Freq: Every evening | ORAL | Status: DC
  Administered 2022-04-02 – 2022-04-05 (×4): 0.5 mg via ORAL
  Filled 2022-04-02 (×4): qty 1

## 2022-04-02 MED ORDER — AMPHETAMINE-DEXTROAMPHETAMINE 10 MG PO TABS
20.0000 mg | ORAL_TABLET | Freq: Three times a day (TID) | ORAL | Status: DC
  Administered 2022-04-03 – 2022-04-06 (×5): 20 mg via ORAL
  Filled 2022-04-02 (×2): qty 2
  Filled 2022-04-02: qty 1
  Filled 2022-04-02 (×5): qty 2

## 2022-04-02 MED ORDER — TRAMADOL HCL 50 MG PO TABS
50.0000 mg | ORAL_TABLET | Freq: Once | ORAL | Status: AC
  Administered 2022-04-02: 50 mg via ORAL
  Filled 2022-04-02: qty 1

## 2022-04-02 MED ORDER — SODIUM CHLORIDE 0.9 % IV SOLN
1.0000 g | INTRAVENOUS | Status: DC
  Administered 2022-04-03 – 2022-04-06 (×5): 1 g via INTRAVENOUS
  Filled 2022-04-02 (×4): qty 1

## 2022-04-02 MED ORDER — METHOCARBAMOL 500 MG PO TABS
500.0000 mg | ORAL_TABLET | Freq: Three times a day (TID) | ORAL | Status: DC | PRN
  Administered 2022-04-03: 500 mg via ORAL
  Filled 2022-04-02: qty 1

## 2022-04-02 MED ORDER — HYDRALAZINE HCL 20 MG/ML IJ SOLN
5.0000 mg | INTRAMUSCULAR | Status: DC | PRN
Start: 2022-04-02 — End: 2022-04-06

## 2022-04-02 MED ORDER — SODIUM CHLORIDE 0.9 % IV SOLN
2.0000 g | Freq: Once | INTRAVENOUS | Status: AC
  Administered 2022-04-02: 2 g via INTRAVENOUS
  Filled 2022-04-02: qty 20

## 2022-04-02 MED ORDER — INSULIN GLARGINE-YFGN 100 UNIT/ML ~~LOC~~ SOLN
12.0000 [IU] | Freq: Every day | SUBCUTANEOUS | Status: DC
  Administered 2022-04-03: 12 [IU] via SUBCUTANEOUS
  Filled 2022-04-02 (×2): qty 0.12

## 2022-04-02 MED ORDER — INSULIN ASPART 100 UNIT/ML IJ SOLN: 0.0000 [IU] | Freq: Three times a day (TID) | INTRAMUSCULAR | Status: DC

## 2022-04-02 MED ORDER — CLONAZEPAM 0.5 MG PO TBDP
1.0000 mg | ORAL_TABLET | Freq: Every morning | ORAL | Status: DC
  Administered 2022-04-03 – 2022-04-06 (×4): 1 mg via ORAL
  Filled 2022-04-02 (×4): qty 2

## 2022-04-02 MED ORDER — VANCOMYCIN HCL IN DEXTROSE 1-5 GM/200ML-% IV SOLN
1000.0000 mg | Freq: Once | INTRAVENOUS | Status: AC
  Administered 2022-04-02: 1000 mg via INTRAVENOUS
  Filled 2022-04-02: qty 200

## 2022-04-02 MED ORDER — IOHEXOL 350 MG/ML SOLN
125.0000 mL | Freq: Once | INTRAVENOUS | Status: AC | PRN
  Administered 2022-04-02: 125 mL via INTRAVENOUS

## 2022-04-02 MED ORDER — POTASSIUM CHLORIDE CRYS ER 20 MEQ PO TBCR
40.0000 meq | EXTENDED_RELEASE_TABLET | ORAL | Status: AC
  Administered 2022-04-02 (×2): 40 meq via ORAL
  Filled 2022-04-02 (×2): qty 2

## 2022-04-02 MED ORDER — ALBUTEROL SULFATE (2.5 MG/3ML) 0.083% IN NEBU: 2.5000 mg | INHALATION_SOLUTION | Freq: Every day | RESPIRATORY_TRACT | Status: DC | PRN

## 2022-04-02 MED ORDER — MORPHINE SULFATE (PF) 4 MG/ML IV SOLN
4.0000 mg | Freq: Once | INTRAVENOUS | Status: AC
  Administered 2022-04-02: 4 mg via INTRAVENOUS
  Filled 2022-04-02: qty 1

## 2022-04-02 MED ORDER — CLONAZEPAM 0.125 MG PO TBDP: 0.2500 mg | ORAL_TABLET | Freq: Two times a day (BID) | ORAL | Status: DC

## 2022-04-02 MED ORDER — VENLAFAXINE HCL ER 75 MG PO CP24
75.0000 mg | ORAL_CAPSULE | Freq: Two times a day (BID) | ORAL | Status: DC
  Administered 2022-04-03 – 2022-04-06 (×7): 75 mg via ORAL
  Filled 2022-04-02 (×8): qty 1

## 2022-04-02 MED ORDER — ACETAMINOPHEN 500 MG PO TABS
1000.0000 mg | ORAL_TABLET | Freq: Four times a day (QID) | ORAL | Status: DC | PRN
  Administered 2022-04-03: 1000 mg via ORAL
  Filled 2022-04-02: qty 2

## 2022-04-02 MED ORDER — NALTREXONE HCL 50 MG PO TABS
50.0000 mg | ORAL_TABLET | Freq: Every day | ORAL | Status: DC
  Administered 2022-04-02 – 2022-04-06 (×5): 50 mg via ORAL
  Filled 2022-04-02 (×5): qty 1

## 2022-04-02 MED ORDER — ENOXAPARIN SODIUM 40 MG/0.4ML IJ SOSY
40.0000 mg | PREFILLED_SYRINGE | INTRAMUSCULAR | Status: DC
  Administered 2022-04-03 – 2022-04-05 (×3): 40 mg via SUBCUTANEOUS
  Filled 2022-04-02 (×3): qty 0.4

## 2022-04-02 MED ORDER — MAGNESIUM SULFATE 2 GM/50ML IV SOLN
2.0000 g | Freq: Once | INTRAVENOUS | Status: AC
Start: 2022-04-02 — End: 2022-04-02
  Administered 2022-04-02: 2 g via INTRAVENOUS
  Filled 2022-04-02: qty 50

## 2022-04-02 NOTE — Assessment & Plan Note (Signed)
Blood pressure normal 120/78.  Patient's not taking amlodipine currently - IV hydralazine as needed

## 2022-04-02 NOTE — Assessment & Plan Note (Signed)
Recent A1c 10.0, poorly controlled.  Patient taking Lantus 10 units daily -Sliding scale insulin -Glargine insulin 12 unit daily

## 2022-04-02 NOTE — Discharge Instructions (Addendum)
Go to the emergency department
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
## 2022-04-02 NOTE — ED Notes (Signed)
Attempted IV. 

## 2022-04-02 NOTE — Assessment & Plan Note (Signed)
Hemoglobin 10.7 on 05/07/2020.  Her hemoglobin is 9.6 today, slightly dropped, no active bleeding. -Follow-up with CBC
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
## 2022-04-02 NOTE — Assessment & Plan Note (Signed)
Adderall

## 2022-04-02 NOTE — Assessment & Plan Note (Signed)
In remission, did not drink alcohol since 5/4. -Continue naltrexone

## 2022-04-02 NOTE — Assessment & Plan Note (Signed)
-   Klonopin Effexor

## 2022-04-02 NOTE — Assessment & Plan Note (Signed)
CTA showed liver cirrhosis. -Check INR, PTT -Check ammonia level

## 2022-04-02 NOTE — Assessment & Plan Note (Signed)
Potassium 2.9, magnesium 1.7 -Repleted potassium -Give 2 g of magnesium sulfate -Check phosphorus level

## 2022-04-02 NOTE — ED Notes (Signed)
Attempted IV Start x2, without success.

## 2022-04-02 NOTE — ED Provider Notes (Signed)
[facility] Provider Note    Event Date/Time   First MD Initiated Contact with Patient 04/02/22 1235     (approximate)   History   Chief Complaint: Leg Swelling   HPI  Stephanie Levy is a 41 y.o. female with a history of alcohol abuse, hypertension diabetes pancreatitis who comes the ED complaining of bilateral lower leg swelling for the past 5 days, gradual onset and constant, worsening.  Leg swelling has started to develop weeping sores and blisters.  Both legs are very painful.  Denies fever, denies trauma.  Denies chest pain or shortness of breath.  No new medications.   Physical Exam   Triage Vital Signs: ED Triage Vitals  Enc Vitals Group     BP 04/02/22 0902 (!) 150/112     Pulse Rate 04/02/22 0902 (!) 102     Resp 04/02/22 0902 16     Temp 04/02/22 0857 98.4 F (36.9 C)     Temp Source 04/02/22 0857 Oral     SpO2 04/02/22 0902 99 %     Weight 04/02/22 0858 135 lb (61.2 kg)     Height 04/02/22 0858 5\' 7"  (1.702 m)     Head Circumference --      Peak Flow --      Pain Score 04/02/22 0858 10     Pain Loc --      Pain Edu? --      Excl. in GC? --     Most recent vital signs: Vitals:   04/02/22 1430 04/02/22 1534  BP: (!) 123/94 120/78  Pulse: 96 94  Resp: 12 15  Temp:    SpO2: 100% 100%    General: Awake, no distress.  CV:  Regular rate rhythm.  Thready DP pulse bilaterally with markedly delayed capillary refill.   Resp:  Normal effort.  Clear to auscultation bilaterally Abd:  No distention.  Soft nontender Other:  2+ pitting edema bilateral lower extremities with symmetric calf circumference.  No purulent drainage.  No crepitus or fluctuance.  Not hot to the touch. There is a diffuse rash on bilateral lower extremities with confluent erythematous patch on medial and lateral areas below the knee, tapering off to petechial looking rash on the thighs with red and brown-colored spots which are blanching.  No frank bullae.  Nikolsky  negative.   ED Results / Procedures / Treatments   Labs (all labs ordered are listed, but only abnormal results are displayed) Labs Reviewed  CBC WITH DIFFERENTIAL/PLATELET - Abnormal; Notable for the following components:      Result Value   RBC 3.43 (*)    Hemoglobin 9.6 (*)    HCT 29.9 (*)    RDW 18.2 (*)    All other components within normal limits  COMPREHENSIVE METABOLIC PANEL - Abnormal; Notable for the following components:   Potassium 2.9 (*)    Glucose, Bld 188 (*)    Calcium 8.3 (*)    Total Protein 6.3 (*)    Albumin 1.8 (*)    Alkaline Phosphatase 327 (*)    Total Bilirubin 1.4 (*)    All other components within normal limits  URINALYSIS, COMPLETE (UACMP) WITH MICROSCOPIC - Abnormal; Notable for the following components:   Color, Urine AMBER (*)    APPearance CLOUDY (*)    Bilirubin Urine SMALL (*)    Protein, ur 30 (*)    All other components within normal limits  T4, FREE - Abnormal; Notable for the following components:  Free T4 1.25 (*)    All other components within normal limits  MAGNESIUM  HCG, QUANTITATIVE, PREGNANCY  TSH  PROTIME-INR     EKG    RADIOLOGY Ultrasound bilateral lower extremities interpreted by me, negative for DVT.  Radiology report reviewed.  CT angiogram aortobifemoral pending   PROCEDURES:  Procedures   MEDICATIONS ORDERED IN ED: Medications  vancomycin (VANCOCIN) IVPB 1000 mg/200 mL premix (1,000 mg Intravenous New Bag/Given 04/02/22 1455)  sodium chloride 0.9 % bolus 1,000 mL (0 mLs Intravenous Stopped 04/02/22 1505)  morphine (PF) 4 MG/ML injection 4 mg (4 mg Intravenous Given 04/02/22 1317)  cefTRIAXone (ROCEPHIN) 2 g in sodium chloride 0.9 % 100 mL IVPB (0 g Intravenous Stopped 04/02/22 1505)  morphine (PF) 4 MG/ML injection 4 mg (4 mg Intravenous Given 04/02/22 1503)  iohexol (OMNIPAQUE) 350 MG/ML injection 125 mL (125 mLs Intravenous Contrast Given 04/02/22 1512)     IMPRESSION / MDM / ASSESSMENT AND PLAN / ED  COURSE  I reviewed the triage vital signs and the nursing notes.                              Differential diagnosis includes, but is not limited to, DVT, aortic occlusion, cellulitis, cirrhosis with anasarca, renal failure, electrolyte abnormality  Patient's presentation is most consistent with acute presentation with potential threat to life or bodily function.  Patient presents with bilateral lower extremity edema and pain.  Vitals unremarkable, not septic.  Labs show albumin of 1.8, possibly indicating cirrhosis as a cause for lower extremity edema.  Ultrasounds negative for DVT.  Will rule out arterial occlusion and cover with Rocephin and vancomycin for possible cellulitis.  Patient will need to be admitted for further evaluation of these symptoms.       FINAL CLINICAL IMPRESSION(S) / ED DIAGNOSES   Final diagnoses:  Anasarca  Bilateral lower extremity edema     Rx / DC Orders   ED Discharge Orders     None        Note:  This document was prepared using Dragon voice recognition software and may include unintentional dictation errors.   Sharman Cheek, MD 04/02/22 [phone]

## 2022-04-02 NOTE — ED Triage Notes (Signed)
Pt here with bilateral leg swelling, blistering and weeping x 2 days. No SOB no severe pain.

## 2022-04-02 NOTE — Assessment & Plan Note (Signed)
Patient does not meet criteria for sepsis.  Lower extremity venous Doppler negative for DVT.  CT angiogram is normal CTA run-off.  - Admitted to MedSurg bed as inpatient - Empiric antimicrobial treatment with Rocephin (patient also received 1 dose of vancomycin in ED) - PRN Zofran for nausea, Tylenol for pain - Blood cultures x 2  - ESR and CRP - wound care consult - IVF: 1.0 L of NS bolus in ED

## 2022-04-02 NOTE — ED Triage Notes (Signed)
Patient c/o bilateral leg swelling with blisters that have popped and fluid leaking/weeping. Reports symptoms started X5 days ago

## 2022-04-02 NOTE — Consult Note (Signed)
WOC Nurse Consult Note: Reason for Consult:bilateral LEs with partial thickness areas of skin loss secondary to medical adhesive (strip bandage, Band- Aid) application and removal. Photodocumentation provide to EHR by Dr. Clyde Lundborg.  Past Medical History reviewed by this Clinical research associate. Wound type: medical adhesive related skin injury Pressure Injury POA: N/A Measurement: Not measured today Wound DEY:CXKGYJE thickness, pink moist Drainage (amount, consistency, odor) serous Periwound: erythematous, edematous Dressing procedure/placement/frequency: I will provide guidance for Nursing in the topical care of these lesions, using a NS cleanse, pat dry and covering with white petrolatum (Vaseline) folded gauze. This is to be topped with ABD pads and secured with a few turns of Kerlix roll gauze/paper tape. And changed daily. PI prevention with a sacral foam and the floatation of heels is recommended. Turning and repositioning is already in place per house protocol.  WOC nursing team will not follow, but will remain available to this patient, the nursing and medical teams.  Please re-consult if needed.  Thank you for inviting [place] to participate in this patient's Plan of Care.  Ladona Mow, MSN, RN, CNS, GNP, Leda Min, [employer], [employer] phone:  [phone]

## 2022-04-03 ENCOUNTER — Inpatient Hospital Stay: Payer: BC Managed Care – PPO

## 2022-04-03 DIAGNOSIS — L03119 Cellulitis of unspecified part of limb: Secondary | ICD-10-CM | POA: Diagnosis not present

## 2022-04-03 DIAGNOSIS — L8990 Pressure ulcer of unspecified site, unspecified stage: Secondary | ICD-10-CM | POA: Insufficient documentation

## 2022-04-03 LAB — CBC
HCT: 25.8 % — ABNORMAL LOW (ref 36.0–46.0)
Hemoglobin: 8.3 g/dL — ABNORMAL LOW (ref 12.0–15.0)
MCH: 27.6 pg (ref 26.0–34.0)
MCHC: 32.2 g/dL (ref 30.0–36.0)
MCV: 85.7 fL (ref 80.0–100.0)
Platelets: 273 10*3/uL (ref 150–400)
RBC: 3.01 MIL/uL — ABNORMAL LOW (ref 3.87–5.11)
RDW: 17.5 % — ABNORMAL HIGH (ref 11.5–15.5)
WBC: 7.3 10*3/uL (ref 4.0–10.5)
nRBC: 0 % (ref 0.0–0.2)

## 2022-04-03 LAB — BASIC METABOLIC PANEL
Anion gap: 5 (ref 5–15)
BUN: 10 mg/dL (ref 6–20)
CO2: 25 mmol/L (ref 22–32)
Calcium: 7.8 mg/dL — ABNORMAL LOW (ref 8.9–10.3)
Chloride: 108 mmol/L (ref 98–111)
Creatinine, Ser: 0.62 mg/dL (ref 0.44–1.00)
GFR, Estimated: >60 mL/min (ref >60)
Glucose, Bld: 99 mg/dL (ref 70–99)
Potassium: 3.9 mmol/L (ref 3.5–5.1)
Sodium: 138 mmol/L (ref 135–145)

## 2022-04-03 LAB — C-REACTIVE PROTEIN: CRP: 2 mg/dL — ABNORMAL HIGH (ref <1.0)

## 2022-04-03 LAB — HIV ANTIBODY (ROUTINE TESTING W REFLEX): HIV Screen 4th Generation wRfx: NONREACTIVE

## 2022-04-03 LAB — GLUCOSE, CAPILLARY
Glucose-Capillary: 103 mg/dL — ABNORMAL HIGH (ref 70–99)
Glucose-Capillary: 115 mg/dL — ABNORMAL HIGH (ref 70–99)
Glucose-Capillary: 106 mg/dL — ABNORMAL HIGH (ref 70–99)
Glucose-Capillary: 123 mg/dL — ABNORMAL HIGH (ref 70–99)

## 2022-04-03 MED ORDER — FUROSEMIDE 10 MG/ML IJ SOLN
40.0000 mg | Freq: Two times a day (BID) | INTRAMUSCULAR | Status: DC
  Administered 2022-04-03 – 2022-04-04 (×3): 40 mg via INTRAVENOUS
  Filled 2022-04-03 (×3): qty 4

## 2022-04-03 MED ORDER — TRAMADOL HCL 50 MG PO TABS: 50.0000 mg | ORAL_TABLET | Freq: Four times a day (QID) | ORAL | Status: DC | PRN

## 2022-04-03 MED ORDER — ACETAMINOPHEN 500 MG PO TABS: 500.0000 mg | ORAL_TABLET | Freq: Four times a day (QID) | ORAL | Status: DC | PRN

## 2022-04-03 MED ORDER — POTASSIUM CHLORIDE CRYS ER 20 MEQ PO TBCR
20.0000 meq | EXTENDED_RELEASE_TABLET | Freq: Every day | ORAL | Status: DC
  Administered 2022-04-03 – 2022-04-06 (×4): 20 meq via ORAL
  Filled 2022-04-03 (×4): qty 1

## 2022-04-03 MED ORDER — TRAMADOL HCL 50 MG PO TABS
50.0000 mg | ORAL_TABLET | Freq: Four times a day (QID) | ORAL | Status: DC | PRN
  Administered 2022-04-04: 50 mg via ORAL
  Filled 2022-04-03: qty 1

## 2022-04-03 MED ORDER — HYDROCODONE-ACETAMINOPHEN 5-325 MG PO TABS
1.0000 | ORAL_TABLET | Freq: Four times a day (QID) | ORAL | Status: DC | PRN
  Administered 2022-04-03 – 2022-04-05 (×3): 2 via ORAL
  Filled 2022-04-03 (×3): qty 2

## 2022-04-03 NOTE — Progress Notes (Signed)
  Progress Note   Patient: Stephanie Levy ZOX:[id number] DOB: [birth year] DOA: 04/02/2022     1 DOS: the patient was seen and examined on 04/03/2022   Brief hospital course: Stephanie Levy is a 41 y.o. 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   Patient states that she has a bilateral leg pain and swelling swelling for more than 4 days, which has been progressively worsening  Assessment and Plan: * Cellulitis of lower extremity --Patient does not meet criteria for sepsis.  Lower extremity venous Doppler negative for DVT.  -- CT angiogram is normal CTA run-off. -- Empiric antimicrobial treatment with Rocephin (patient also received 1 dose of vancomycin in ED) - PRN Zofran for nausea, Tylenol for pain - Blood cultures x 2 so far neg - wound care consult--noted   Alcohol abuse In remission, did not drink alcohol since 5/4. -Continue naltrexone  HTN (hypertension) Blood pressure normal 120/78.  Patient's not taking amlodipine currently - IV hydralazine as needed  Alcoholic cirrhosis of liver with ascites (HCC) CTA showed liver cirrhosis. -Check INR, PTT --will give alsix for leg swelling --pt advised to have PCP referral for GI to monitor cirrhosis  Depression with anxiety - Klonopin Effexor  ADHD -Adderall  Hypokalemia Potassium 2.9, magnesium 1.7 -Repleted potassium -Give 2 g of magnesium sulfate -Check phosphorus level  Diabetes mellitus without complication (HCC) Recent A1c 10.0, poorly controlled.  Patient taking Lantus 10 units daily -Sliding scale insulin -Glargine insulin 12 unit daily  Normocytic anemia Hemoglobin 10.7 on 05/07/2020.  Her hemoglobin is 9.6 today, slightly dropped, no active bleeding. -Follow-up with CBC   PT/OT to see pt TOC for d/c  planning     Subjective: Came in with leg swelling and weeping wounds and painful erythema both legs for 4-5 days H/o alcohol use(pt reports has quit since May 2023) C/o pain both legs  Physical Exam: Vitals:   04/02/22 2050 04/02/22 2130 04/03/22 0538 04/03/22 0749  BP: (!) 149/101  106/75 103/78  Pulse: 66  (!) 103 (!) 103  Resp: 15  16 17   Temp:  (!) 97.5 F (36.4 C) 98 F (36.7 C) 98.3 F (36.8 C)  TempSrc:  Oral    SpO2: 100%  99% 100%  Weight: 63 kg     Height:       General: Not in acute distress Cardiac: S1/S2, RRR, No murmurs, No gallops or rubs. Respiratory: No rales, wheezing, rhonchi or rubs. GI: Soft, nondistended, nontender, no rebound pain, no organomegaly, BS present. Ext: 2+ pitting leg edema bilaterally. 1+DP/PT pulse bilaterally. Skin: Patient has erythema, warmth, tenderness, swelling in both legs, with weeping, skin tear in both legs.   Family Communication: none  Disposition: Status is: Inpatient Remains inpatient appropriate because: IV abx, IV lasix  Planned Discharge Destination: Home with Home Health    Time spent: 35 minutes  Author: , MD 04/03/2022 2:40 PM  For on call review [email or web address].

## 2022-04-03 NOTE — Hospital Course (Signed)
Stephanie Levy is a 41 y.o. 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   Patient states that she has a bilateral leg pain and swelling swelling for more than 4 days, which has been progressively worsening

## 2022-04-04 DIAGNOSIS — L03119 Cellulitis of unspecified part of limb: Secondary | ICD-10-CM | POA: Diagnosis not present

## 2022-04-04 LAB — GLUCOSE, CAPILLARY
Glucose-Capillary: 59 mg/dL — ABNORMAL LOW (ref 70–99)
Glucose-Capillary: 50 mg/dL — ABNORMAL LOW (ref 70–99)
Glucose-Capillary: 64 mg/dL — ABNORMAL LOW (ref 70–99)
Glucose-Capillary: 57 mg/dL — ABNORMAL LOW (ref 70–99)
Glucose-Capillary: 77 mg/dL (ref 70–99)
Glucose-Capillary: 69 mg/dL — ABNORMAL LOW (ref 70–99)
Glucose-Capillary: 63 mg/dL — ABNORMAL LOW (ref 70–99)
Glucose-Capillary: 88 mg/dL (ref 70–99)

## 2022-04-04 MED ORDER — INSULIN GLARGINE-YFGN 100 UNIT/ML ~~LOC~~ SOLN: 5.0000 [IU] | Freq: Every day | SUBCUTANEOUS | Status: DC

## 2022-04-04 MED ORDER — INSULIN GLARGINE-YFGN 100 UNIT/ML ~~LOC~~ SOLN: 7.0000 [IU] | Freq: Every day | SUBCUTANEOUS | Status: DC

## 2022-04-04 NOTE — Plan of Care (Signed)

## 2022-04-04 NOTE — Progress Notes (Signed)
  Progress Note   Patient: Stephanie Levy QIH:[id number] DOB: 03-Aug-1981 DOA: 04/02/2022     2 DOS: the patient was seen and examined on 04/04/2022   Brief hospital course: Stephanie Levy is a 41 y.o. 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   Patient states that she has a bilateral leg pain and swelling swelling for more than 4 days, which has been progressively worsening  Assessment and Plan: * Cellulitis of lower extremity --Patient does not meet criteria for sepsis.  Lower extremity venous Doppler negative for DVT.  -- CT angiogram is normal CTA run-off. -- Empiric antimicrobial treatment with Rocephin (patient also received 1 dose of vancomycin in ED) - PRN Zofran for nausea, Tylenol for pain - Blood cultures x 2 so far neg - wound care consult--noted   Alcohol abuse In remission, did not drink alcohol since 5/4. -Continue naltrexone  HTN (hypertension) Blood pressure normal 120/78.  Patient's not taking amlodipine currently - IV hydralazine as needed  Alcoholic cirrhosis of liver with ascites (HCC) CTA showed liver cirrhosis. -Check INR, PTT --will give lasix for leg swelling --pt advised to have PCP referral for GI to monitor cirrhosis --pt's leg swelling improving.cont IV diuresis  Depression with anxiety - Klonopin Effexor  ADHD -Adderall  Hypokalemia Potassium 2.9, magnesium 1.7 -Repleted potassium -Give 2 g of magnesium sulfate -Check phosphorus level  Diabetes mellitus without complication (HCC) A1c 10.0 (2020), poorly controlled.   -Patient taking Lantus 10 units daily -Sliding scale insulin -sugars low. Hold Lantus for now   Normocytic anemia Hemoglobin 10.7 on 05/07/2020.  Her hemoglobin is 9.6 today, slightly dropped, no active  bleeding. -Follow-up with CBC   Dietitian to see pt    PT/OT to see pt TOC for d/c planning     Subjective: Came in with leg swelling and weeping wounds and painful erythema both legs for 4-5 days H/o alcohol use(pt reports has quit since May 2023) C/o pain both legs  Physical Exam: Vitals:   04/03/22 2022 04/04/22 0235 04/04/22 0500 04/04/22 0736  BP: 108/88 115/87  99/76  Pulse: (!) 110 92  93  Resp:      Temp:  98.2 F (36.8 C)  98.2 F (36.8 C)  TempSrc:  Oral    SpO2: 100% 100%  98%  Weight:   61 kg   Height:       General: Not in acute distress Cardiac: S1/S2, RRR, No murmurs, No gallops or rubs. Respiratory: No rales, wheezing, rhonchi or rubs. GI: Soft, nondistended, nontender, no rebound pain, no organomegaly, BS present. Ext: 2+ pitting leg edema bilaterally. 1+DP/PT pulse bilaterally. Skin: Patient has erythema, warmth, tenderness, swelling in both legs, with weeping, skin tear in both legs.   Family Communication: none  Disposition: Status is: Inpatient Remains inpatient appropriate because: IV abx, IV lasix  Planned Discharge Destination: Home with Home Health    Time spent: 35 minutes  Author: Enedina Finner, MD 04/04/2022 11:49 AM  For on call review [email or web address].

## 2022-04-04 NOTE — Progress Notes (Signed)
Hypoglycemic Event  CBG: 59  Treatment: 4 oz juice/soda  Symptoms: None  Follow-up CBG: Time:0940 CBG Result:50   Possible Reasons for Event: Inadequate meal intake  Comments/MD notified:Patel MD    Bing Quarry

## 2022-04-04 NOTE — Evaluation (Signed)
Physical Therapy Evaluation Patient Details Name: Stephanie Levy MRN: [id number] DOB: 1981/06/30 Today's Date: 04/04/2022  History of Present Illness  Pt is is a 41 y.o. 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 MD assessment includes Cellulitis of LEs, HTN, Alcoholic cirrhosis of liver with ascites  Clinical Impression  Pt was pleasant during the session and put forth good effort throughout. Pt was able to complete supine to sit w/ CGA: minA needed for LE management moving sit to supine. Pt was able to ambulate 40ft using RW w/ CGA with slow steady gait w/ no LOB. Pt declined further ambulation due to fatigue. Pt will benefit from HHPT upon discharge to safely address deficits listed in patient problem list for decreased caregiver assistance and eventual return to PLOF.       Recommendations for follow up therapy are one component of a multi-disciplinary discharge planning process, led by the attending physician.  Recommendations may be updated based on patient status, additional functional criteria and insurance authorization.  Follow Up Recommendations Home health PT      Assistance Recommended at Discharge Intermittent Supervision/Assistance  Patient can return home with the following  A little help with walking and/or transfers;A little help with bathing/dressing/bathroom;Assistance with cooking/housework;Assist for transportation;Help with stairs or ramp for entrance    Equipment Recommendations None recommended by PT  Recommendations for Other Services       Functional Status Assessment Patient has had a recent decline in their functional status and demonstrates the ability to make significant improvements in function in a reasonable and predictable amount of time.      Precautions / Restrictions Precautions Precautions: Fall Restrictions Weight Bearing Restrictions: No      Mobility  Bed Mobility Overal bed mobility: Needs Assistance Bed Mobility: Supine to Sit, Sit to Supine     Supine to sit: Min guard, HOB elevated Sit to supine: Min assist   General bed mobility comments: minA for LE managment moving sit <> supine    Transfers Overall transfer level: Needs assistance Equipment used: Rolling walker (2 wheels) Transfers: Sit to/from Stand Sit to Stand: Min guard, From elevated surface           General transfer comment: effortfull stand and requiring bed to be elevated    Ambulation/Gait Ambulation/Gait assistance: Min guard Gait Distance (Feet): 15 Feet Assistive device: Rolling walker (2 wheels) Gait Pattern/deviations: Step-through pattern, Decreased step length - right, Decreased step length - left Gait velocity: decreased     General Gait Details: slow steady gait w/ no LOB  Stairs            Wheelchair Mobility    Modified Rankin (Stroke Patients Only)       Balance Overall balance assessment: Needs assistance Sitting-balance support: Feet supported, Bilateral upper extremity supported Sitting balance-Leahy Scale: Good     Standing balance support: Bilateral upper extremity supported, During functional activity Standing balance-Leahy Scale: Fair                               Pertinent Vitals/Pain Pain Assessment Pain Assessment: 0-10 Pain Score: 7  Pain Location: BLEs Pain Descriptors / Indicators: Aching Pain Intervention(s): Monitored during session, Repositioned, Premedicated before session    Home Living Family/patient expects to be discharged to:: Private residence Living Arrangements: Alone  Available Help at Discharge: Family;Available 24 hours/day Type of Home: House Home Access: Stairs to enter Entrance Stairs-Rails: None Entrance Stairs-Number of Steps: 2   Home  Layout: One level Home Equipment: Agricultural consultant (2 wheels);Toilet riser      Prior Function Prior Level of Function : Independent/Modified Independent             Mobility Comments: Ind community ambulator using no AD, no hx of falls ADLs Comments: Ind w/ ADLs     Hand Dominance        Extremity/Trunk Assessment   Upper Extremity Assessment Upper Extremity Assessment: Overall WFL for tasks assessed    Lower Extremity Assessment Lower Extremity Assessment: Generalized weakness       Communication   Communication: No difficulties  Cognition Arousal/Alertness: Awake/alert Behavior During Therapy: WFL for tasks assessed/performed Overall Cognitive Status: Within Functional Limits for tasks assessed                                          General Comments      Exercises General Exercises - Lower Extremity Ankle Circles/Pumps: Strengthening, Both, 10 reps Quad Sets: Strengthening, Both, 10 reps Gluteal Sets: Strengthening, Both, 10 reps Hip ABduction/ADduction: Strengthening, Both, 10 reps   Assessment/Plan    PT Assessment Patient needs continued PT services  PT Problem List Decreased strength;Decreased mobility;Decreased activity tolerance;Decreased balance;Decreased knowledge of use of DME       PT Treatment Interventions DME instruction;Therapeutic exercise;Balance training;Gait training;Stair training;Functional mobility training;Therapeutic activities;Patient/family education    PT Goals (Current goals can be found in the Care Plan section)  Acute Rehab PT Goals Patient Stated Goal: pt would to regain BLE strength PT Goal Formulation: With patient Time For Goal Achievement: 04/17/22 Potential to Achieve Goals: Good    Frequency Min 2X/week     Co-evaluation               AM-PAC PT "6 Clicks" Mobility  Outcome Measure Help needed turning from your back to your side while in a flat bed without using bedrails?: A  Little Help needed moving from lying on your back to sitting on the side of a flat bed without using bedrails?: A Little Help needed moving to and from a bed to a chair (including a wheelchair)?: A Little Help needed standing up from a chair using your arms (e.g., wheelchair or bedside chair)?: A Little Help needed to walk in hospital room?: A Little Help needed climbing 3-5 steps with a railing? : A Little 6 Click Score: 18    End of Session Equipment Utilized During Treatment: Gait belt Activity Tolerance: Patient tolerated treatment well Patient left: in bed;with call bell/phone within reach;with bed alarm set Nurse Communication: Mobility status PT Visit Diagnosis: Unsteadiness on feet (R26.81);Difficulty in walking, not elsewhere classified (R26.2);Muscle weakness (generalized) (M62.81);Pain Pain - Right/Left:  (Bilat) Pain - part of body: Leg    Time: 1610-9604 PT Time Calculation (min) (ACUTE ONLY): 23 min   Charges:              Marica Otter, SPT  04/04/2022, 2:47 PM

## 2022-04-05 DIAGNOSIS — L03119 Cellulitis of unspecified part of limb: Secondary | ICD-10-CM | POA: Diagnosis not present

## 2022-04-05 LAB — GLUCOSE, CAPILLARY
Glucose-Capillary: 53 mg/dL — ABNORMAL LOW (ref 70–99)
Glucose-Capillary: 60 mg/dL — ABNORMAL LOW (ref 70–99)
Glucose-Capillary: 68 mg/dL — ABNORMAL LOW (ref 70–99)
Glucose-Capillary: 72 mg/dL (ref 70–99)
Glucose-Capillary: 91 mg/dL (ref 70–99)

## 2022-04-05 LAB — URINE CULTURE: Culture: 60000 — AB

## 2022-04-05 MED ORDER — ASCORBIC ACID 500 MG PO TABS
500.0000 mg | ORAL_TABLET | Freq: Two times a day (BID) | ORAL | Status: DC
  Administered 2022-04-05 – 2022-04-06 (×2): 500 mg via ORAL
  Filled 2022-04-05 (×2): qty 1

## 2022-04-05 MED ORDER — ZINC SULFATE 220 (50 ZN) MG PO CAPS
220.0000 mg | ORAL_CAPSULE | Freq: Every day | ORAL | Status: DC
  Administered 2022-04-05 – 2022-04-06 (×2): 220 mg via ORAL
  Filled 2022-04-05 (×2): qty 1

## 2022-04-05 MED ORDER — GABAPENTIN 300 MG PO CAPS: 300.0000 mg | ORAL_CAPSULE | Freq: Two times a day (BID) | ORAL | Status: DC

## 2022-04-05 MED ORDER — FUROSEMIDE 10 MG/ML IJ SOLN: 40.0000 mg | Freq: Every day | INTRAMUSCULAR | Status: DC

## 2022-04-05 MED ORDER — ENSURE ENLIVE PO LIQD: 237.0000 mL | Freq: Three times a day (TID) | ORAL | Status: DC

## 2022-04-05 MED ORDER — ADULT MULTIVITAMIN W/MINERALS CH
1.0000 | ORAL_TABLET | Freq: Every day | ORAL | Status: DC
  Administered 2022-04-05 – 2022-04-06 (×2): 1 via ORAL
  Filled 2022-04-05 (×2): qty 1

## 2022-04-05 NOTE — Progress Notes (Signed)
  Progress Note   Patient: Stephanie Levy IEP:[id number] DOB: 05/14/1981 DOA: 04/02/2022     3 DOS: the patient was seen and examined on 04/05/2022   Brief hospital course: Stephanie Levy is a 41 y.o. 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   Patient states that she has a bilateral leg pain and swelling swelling for more than 4 days, which has been progressively worsening  Assessment and Plan: * Cellulitis of lower extremity --Patient does not meet criteria for sepsis.  Lower extremity venous Doppler negative for DVT.  -- CT angiogram is normal CTA run-off. -- Empiric antimicrobial treatment with Rocephin (patient also received 1 dose of vancomycin in ED) - PRN Zofran for nausea, Tylenol for pain - Blood cultures x 2 so far neg - wound care consult--noted   Alcohol abuse In remission, did not drink alcohol since 5/4. -Continue naltrexone  HTN (hypertension) Blood pressure normal 120/78.  Patient's not taking amlodipine currently - IV hydralazine as needed  Alcoholic cirrhosis of liver with ascites (HCC) CTA showed liver cirrhosis. -Check INR, PTT --will give lasix for leg swelling --pt advised to have PCP referral for GI to monitor cirrhosis --pt's leg swelling improving.cont IV diuresis  Depression with anxiety - Klonopin  And Effexor  ADHD -Adderall  Hypokalemia Potassium 2.9, magnesium 1.7 -Repleted potassium -Give 2 g of magnesium sulfate -Check phosphorus level  Diabetes mellitus without complication (HCC) A1c 10.0 (2020), poorly controlled.   -Patient taking Lantus 10 units daily -Sliding scale insulin -sugars low. Hold Lantus for now   Normocytic anemia Hemoglobin 10.7 on 05/07/2020.  Her hemoglobin is 9.6 today, slightly dropped, no active  bleeding. -Follow-up with CBC   Severe protein calorie malnutrition --Dietitian to see pt     PT/OT --recommends rehab. Pt is adamant and want to go home TOC for d/c planning     Subjective: Came in with leg swelling and weeping wounds and painful erythema both legs for 4-5 days H/o alcohol use(pt reports has quit since May 2023) C/o pain both legs  Physical Exam: Vitals:   04/04/22 2054 04/05/22 0413 04/05/22 0428 04/05/22 0802  BP: 109/86 110/83  122/85  Pulse: (!) 110 88  81  Resp: 17 18  18   Temp: 98.3 F (36.8 C) 98.1 F (36.7 C)  (!) 97.5 F (36.4 C)  TempSrc:  Oral    SpO2: 100% 99%  99%  Weight:   57.6 kg   Height:       General: Not in acute distress Cardiac: S1/S2, RRR, No murmurs, No gallops or rubs. Respiratory: No rales, wheezing, rhonchi or rubs. GI: Soft, nondistended, nontender, no rebound pain, no organomegaly, BS present. Ext: 2+ pitting leg edema bilaterally. 1+DP/PT pulse bilaterally. Skin: Patient has erythema, warmth, tenderness, swelling in both legs, with weeping, skin tear in both legs. Leg edema much decreased  Family Communication: none  Disposition: Status is: Inpatient Remains inpatient appropriate because: IV abx, IV lasix  Planned Discharge Destination: Home with Home Health    Time spent: 35 minutes  Author: , MD 04/05/2022 1:50 PM  For on call review [email or web address].

## 2022-04-05 NOTE — Plan of Care (Signed)

## 2022-04-05 NOTE — Progress Notes (Signed)
Initial Nutrition Assessment  DOCUMENTATION CODES:   Non-severe (moderate) malnutrition in context of chronic illness  INTERVENTION:   -Liberalize diet to regular for widest variety of meal selections -MVI with minerals daily -Ensure Enlive po TID, each supplement provides 350 kcal and 20 grams of protein -500 mg vitamin C BID -220 mg zinc sulfate daily x 14 days  NUTRITION DIAGNOSIS:   Moderate Malnutrition related to chronic illness (cirrhosis) as evidenced by moderate fat depletion, moderate muscle depletion.  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  Pt admitted with lt lower extremity cellulitis.   Reviewed I/O's: -1.7 L x 24 hours and -3.2 L since admission  UOP: 1.8 L x 24 hours  Case discussed with Dr. Allena Katz; pt with history of alcoholism, but stopped drinking about 2 months ago. She is concerned about pt's nutritional status and has asked RD to evaluate.   Attempted to speak with pt x 2, however, pt refused exam and interview at both visits. Pt kept her eyes closed and did not remove blankets. She continued to ask RD to assess later when she was done sleeping.   RD noticed depletions on face and calves, however, RD unable to assess other areas of exam.   Noted pt with distant history of weight loss.   Pt currently on a carb modified diet. Noted meal completions 30%.   Pt with increased nutritional needs for wound healing and would benefit from addition of oral nutrition supplements.   Medications reviewed and include lasix and potassium  chloride.   Lab Results  Component Value Date   HGBA1C 10.0 (H) 12/25/2018   PTA DM medications are 16 units insulin glargine daily.   Labs reviewed: CBGS: 68-91 (inpatient orders for glycemic control are 0-5 units insulin aspart daily at bedtime and 0-9 units insulin aspart TID with meals).    NUTRITION - FOCUSED PHYSICAL EXAM:  Flowsheet Row Most Recent Value  Orbital Region Moderate depletion  Upper Arm Region Unable to assess  Thoracic and Lumbar Region Unable to assess  Buccal Region Moderate depletion  Temple Region Moderate depletion  Clavicle Bone Region Unable to assess  Clavicle and Acromion Bone Region Unable to assess  Scapular Bone Region Unable to assess  Dorsal Hand Unable to assess  Patellar Region Unable to assess  Anterior Thigh Region Unable to assess  Posterior Calf Region Moderate depletion  Edema (RD Assessment) Unable to assess  Hair Unable to assess  Eyes Unable to assess  Mouth Unable to assess  Skin Unable to assess  Nails Unable to assess       Diet Order:   Diet Order             Diet Carb Modified Fluid consistency: Thin; Room service appropriate? 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  Estimated Nutritional Needs:   Kcal:  2000-2200  Protein:  115-130 grams  Fluid:  > 2 L    06/06/22, RD, LDN, CDCES Registered Dietitian II Certified Diabetes Care and Education Specialist Please refer to [facility] for RD and/or RD on-call/weekend/after hours pager

## 2022-04-05 NOTE — Progress Notes (Signed)
PT Cancellation Note  Patient Details Name: Stephanie Levy MRN: [id number] DOB: 1980/10/29   Cancelled Treatment:     Therapist in several times this am, pt with very poor motivation, requesting to finish her nap. Unable to encourage participation, MD and nursing aware. Will re-attempt next available date/time. Continue to recommend SNF once medically stable.   Jannet Askew 04/05/2022, 12:41 PM

## 2022-04-06 DIAGNOSIS — E440 Moderate protein-calorie malnutrition: Secondary | ICD-10-CM | POA: Insufficient documentation

## 2022-04-06 DIAGNOSIS — L03119 Cellulitis of unspecified part of limb: Secondary | ICD-10-CM | POA: Diagnosis not present

## 2022-04-06 LAB — GLUCOSE, CAPILLARY
Glucose-Capillary: 52 mg/dL — ABNORMAL LOW (ref 70–99)
Glucose-Capillary: 113 mg/dL — ABNORMAL HIGH (ref 70–99)
Glucose-Capillary: 84 mg/dL (ref 70–99)
Glucose-Capillary: 61 mg/dL — ABNORMAL LOW (ref 70–99)
Glucose-Capillary: 63 mg/dL — ABNORMAL LOW (ref 70–99)
Glucose-Capillary: 64 mg/dL — ABNORMAL LOW (ref 70–99)
Glucose-Capillary: 98 mg/dL (ref 70–99)

## 2022-04-06 LAB — HEMOGLOBIN A1C
Hgb A1c MFr Bld: 6.8 % — ABNORMAL HIGH (ref 4.8–5.6)
Mean Plasma Glucose: 148 mg/dL

## 2022-04-06 MED ORDER — ZINC SULFATE 220 (50 ZN) MG PO CAPS: 220.0000 mg | ORAL_CAPSULE | Freq: Every day | ORAL | 0 refills | Status: AC

## 2022-04-06 MED ORDER — CEPHALEXIN 500 MG PO CAPS: 500.0000 mg | ORAL_CAPSULE | Freq: Four times a day (QID) | ORAL | Status: DC

## 2022-04-06 MED ORDER — CEPHALEXIN 500 MG PO CAPS: 500.0000 mg | ORAL_CAPSULE | Freq: Four times a day (QID) | ORAL | 0 refills | Status: AC

## 2022-04-06 MED ORDER — ASCORBIC ACID 500 MG PO TABS: 500.0000 mg | ORAL_TABLET | Freq: Two times a day (BID) | ORAL | 0 refills | Status: AC

## 2022-04-06 MED ORDER — ADULT MULTIVITAMIN W/MINERALS CH: 1.0000 | ORAL_TABLET | Freq: Every day | ORAL | 0 refills | Status: AC

## 2022-04-06 MED ORDER — POTASSIUM CHLORIDE CRYS ER 20 MEQ PO TBCR: 20.0000 meq | EXTENDED_RELEASE_TABLET | Freq: Every day | ORAL | 0 refills | Status: DC

## 2022-04-06 MED ORDER — ENSURE ENLIVE PO LIQD: 237.0000 mL | Freq: Three times a day (TID) | ORAL | 12 refills | Status: AC

## 2022-04-06 MED ORDER — TRAMADOL HCL 50 MG PO TABS: 50.0000 mg | ORAL_TABLET | Freq: Two times a day (BID) | ORAL | 0 refills | Status: DC | PRN

## 2022-04-06 MED ORDER — FUROSEMIDE 20 MG PO TABS
20.0000 mg | ORAL_TABLET | Freq: Every day | ORAL | Status: DC
Start: 2022-04-06 — End: 2022-04-06
  Administered 2022-04-06: 20 mg via ORAL
  Filled 2022-04-06: qty 1

## 2022-04-06 MED ORDER — FUROSEMIDE 20 MG PO TABS: 20.0000 mg | ORAL_TABLET | Freq: Every day | ORAL | 0 refills | Status: AC

## 2022-04-06 NOTE — TOC CM/SW Note (Signed)
[facility] REFERRAL        Occupational Therapy * Physical Therapy * Speech Therapy                           DATE _7/4/2023__       PATIENT NAME_[name]___  PATIENT JJO___[id number]___       DIAGNOSIS/DIAGNOSIS CODE __L03.119__ DATE OF DISCHARGE: __7/4/2023__       PRIMARY CARE PHYSICIAN _[facility]__    PCP PHONE/FAX__P:[phone]__      Dear Provider (Name: [facility]___ Fax: _[phone]__):   I certify that I have examined this patient and that occupational/physical/speech therapy is necessary on an outpatient basis.    The patient has expressed interest in completing their recommended course of therapy at your  location.  Once a formal order from the patient's primary care physician has been obtained, please  contact him/her to schedule an appointment for evaluation at your earliest convenience.   [ x ]  Physical Therapy Evaluate and Treat          [  ]  Occupational Therapy Evaluate and Treat                                                             [  ]  Speech Therapy Evaluate and Treat         The patient's primary care physician (listed above) must furnish and be responsible for a formal order such that the recommended services may be furnished while under the primary physician's care, and that the plan of care will be established and reviewed every 30 days (or more often if condition necessitates).

## 2022-04-06 NOTE — Discharge Instructions (Addendum)
Keep log of your sugars at home I have discontinued your insulin since your sugars are stable. D/w your PCP and get referral to GI clinic for Cirrhosis of liver evaluation

## 2022-04-06 NOTE — Progress Notes (Addendum)
0800 Dr Allena Katz aware pt BS 52, rechecked now BS 113. Verbal orders with readback for 20mg  PO lasix and d/c IV lasix  1300 Pt ambulating in hallway with stand by assist, ambulated to secretary desk and returned to room sitting up in chair.   1316 Dr aware that pt ambulated will attempt to arrange for d/c today  1620 Dc instructions reviewed with pt. All questions and concerns answered. Gauze dressings to BLE changed by nurse and explained to pt. Pt verbalized understanding, extra gauze and ABD pad and NS solution given for pt to take home. Ride will be here at 430pm to pick up.  1709 BS this evening 61, juice given BS recheck 64. Nurse informed pt that BS should be 70 or above prior to being d/c. Pt states she should be fine and does not want to stay to wait any longer her ride is outside. Nurse informed hospitalist as well.  1715 BS 98 Pt ready for d/c

## 2022-04-06 NOTE — Discharge Summary (Signed)
Physician Discharge Summary   Patient: Stephanie Levy MRN: [id number] DOB: 1980-11-16  Admit date:     04/02/2022  Discharge date: 04/06/22  Discharge Physician: Fritzi Mandes   PCP: [facility]   Recommendations at discharge:    Keep log of your sugars at home I have discontinued your insulin since your sugars are stable. D/w your PCP and get referral to GI clinic for Cirrhosis of liver evaluation  Discharge Diagnoses: Principal Problem:   Cellulitis of lower extremity Active Problems:   Alcohol abuse   HTN (hypertension)   Alcoholic cirrhosis of liver with ascites ([place])   Depression with anxiety   ADHD   Hypokalemia   Diabetes mellitus without complication (HCC)   Normocytic anemia   Pressure injury of skin   Malnutrition of moderate degree   Hospital Course: Stephanie Levy is a 42 y.o. 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   Patient states that she has a bilateral leg pain and swelling swelling for more than 4 days, which has been progressively worsening  Assessment and Plan: * Cellulitis of lower extremity --Patient does not meet criteria for sepsis.  Lower extremity venous Doppler negative for DVT.  -- CT angiogram is normal CTA run-off. -- Empiric antimicrobial treatment with Rocephin (patient also received 1 dose of vancomycin in ED)--change to po keflex since leg erythema much improved. - PRN Zofran for nausea, Tylenol for pain - Blood cultures x 2 so far neg - wound care consult--noted   Alcohol abuse In remission, did not drink alcohol since 5/4. -Continue naltrexone  HTN (hypertension) Blood pressure normal 120/78.  Patient's not taking amlodipine currently - IV hydralazine as needed  Alcoholic cirrhosis of liver with ascites  ([place]) CTA and US liver showed liver cirrhosis. --pt advised to have PCP referral for GI to monitor cirrhosis --pt's leg swelling improving.change to po lasix  Depression with anxiety - Klonopin  And Effexor  ADHD -Adderall  Hypokalemia Potassium 2.9, magnesium 1.7 -Repleted potassium   Diabetes mellitus without complication (HCC) --M6Q 10.0 (2020) --A1c 6.10 April 2022 -Patient taking Lantus 10 units daily--I have d/ced insulin at discharge since some of the sugars were low. Pt advised to keep log of sugars at home and resume if sugars start going up at home -Sliding scale insulin -sugars low. Hold Lantus for now   Normocytic anemia Hemoglobin 10.7 on 05/07/2020.  Her hemoglobin is 9.6 today, slightly dropped, no active bleeding. -Follow-up with CBC  Malnutrition of moderate degree Severe malnutrition per RD eval --follow recommendatuions  patient ambulated up to the nurses station using walker along with RN. Patient has a walker at home. Will discharged to home. TOC in the process of finding home health for her.       Consultants: none Procedures performed: noe  Disposition: Home health Diet recommendation:  Discharge Diet Orders (From admission, onward)     Start     Ordered   04/06/22 0000  Diet - low sodium heart healthy        04/06/22 1441           Cardiac and Carb modified diet DISCHARGE MEDICATION: Allergies as of 04/06/2022       Reactions   Peanut-containing Drug Products Anaphylaxis   Amoxicillin Other (See Comments)   Gi distress   Citrus Rash  Medication List     STOP taking these medications    insulin glargine 100 UNIT/ML injection Commonly known as: LANTUS       TAKE these medications    acetaminophen 500 MG tablet Commonly known as: TYLENOL Take 2 tablets by mouth 4 (four) times daily as needed.   albuterol 108 (90 Base) MCG/ACT inhaler Commonly known as: VENTOLIN HFA Inhale 1 puff into the lungs daily as needed.    amphetamine-dextroamphetamine 20 MG tablet Commonly known as: ADDERALL Take 20 mg by mouth 3 (three) times daily.   ascorbic acid 500 MG tablet Commonly known as: VITAMIN C Take 1 tablet (500 mg total) by mouth 2 (two) times daily.   blood glucose meter kit and supplies Kit Dispense based on patient and insurance preference. Use up to four times daily as directed. (FOR ICD-9 250.00, 250.01).   cephALEXin 500 MG capsule Commonly known as: KEFLEX Take 1 capsule (500 mg total) by mouth 4 (four) times daily for 2 days. Start taking on: April 07, 2022   clonazePAM 0.5 MG disintegrating tablet Commonly known as: KLONOPIN Take 0.5-1 tablets by mouth 2 (two) times daily. 1MG-AM, 0.5MG-PM   desvenlafaxine 100 MG 24 hr tablet Commonly known as: PRISTIQ Take 100 mg by mouth daily.   feeding supplement Liqd Take 237 mLs by mouth 3 (three) times daily between meals.   furosemide 20 MG tablet Commonly known as: LASIX Take 1 tablet (20 mg total) by mouth daily. Start taking on: April 07, 2022   gabapentin 300 MG capsule Commonly known as: NEURONTIN Take 300 mg by mouth 2 (two) times daily. Take one capsule twice a day for peripheral neuropathy   methocarbamol 500 MG tablet Commonly known as: Robaxin Take 1 tablet (500 mg total) by mouth 4 (four) times daily.   multivitamin with minerals Tabs tablet Take 1 tablet by mouth daily. Start taking on: April 07, 2022   naltrexone 50 MG tablet Commonly known as: DEPADE Take 1 tablet (50 mg total) by mouth daily.   potassium chloride SA 20 MEQ tablet Commonly known as: KLOR-CON M Take 1 tablet (20 mEq total) by mouth daily. Start taking on: April 07, 2022   traMADol 50 MG tablet Commonly known as: ULTRAM Take 1 tablet (50 mg total) by mouth every 12 (twelve) hours as needed for moderate pain.   zinc sulfate 220 (50 Zn) MG capsule Take 1 capsule (220 mg total) by mouth daily for 14 days. Start taking on: April 07, 2022                Discharge Care Instructions  (From admission, onward)           Start     Ordered   04/06/22 0000  Discharge wound care:       Comments: Wound care  Daily      Comments: Wound care to bilateral LE partial thickness skin injuries related to medical adhesives:  Cleanse with NS, pat dry. Cover with white petrolatum (Vaseline) gauze dressings (folded layers), top with ABD pad and secure with a few turns of Kerlix roll gauze/paper tape. Do not apply tape to skin.   04/06/22 1441            Follow-up Information     [facility] Schedule an appointment as soon as possible for a visit in 1 week(s).   Why: hospital f/u Contact information: [place] [phone]  Wellington Hampshire, MD .   Specialty: Cardiology Contact information: [place] [address] [phone]                Discharge Exam: Danley Danker Weights   04/02/22 2050 04/04/22 0500 04/05/22 0428  Weight: 63 kg 61 kg 57.6 kg     Condition at discharge: fair  The results of significant diagnostics from this hospitalization (including imaging, microbiology, ancillary and laboratory) are listed below for reference.   Imaging Studies: US Abdomen Limited RUQ (LIVER/GB)  Result Date: 04/03/2022 CLINICAL DATA:  Cirrhosis. EXAM: ULTRASOUND ABDOMEN LIMITED RIGHT UPPER QUADRANT COMPARISON:  CTA, 04/02/2022. FINDINGS: Gallbladder: Status post cholecystectomy. Common bile duct: Diameter: 6 mm Liver: Coarsened echotexture and increased parenchymal echogenicity. Normal size. Surface nodularity. No defined mass. Portal vein is patent on color Doppler imaging with normal direction of blood flow towards the liver. Other: Ascites. IMPRESSION: 1. Liver appearance consistent with a combination of hepatic steatosis and cirrhosis. No liver mass. 2. Ascites. Electronically Signed   By: Lajean Manes M.D.   On: 04/03/2022 16:32   CT Angio  Aortobifemoral W and/or Wo Contrast  Result Date: 04/02/2022 CLINICAL DATA:  Bilateral lower extremity swelling for the past 2 days. EXAM: CT ANGIOGRAPHY OF ABDOMINAL AORTA WITH ILIOFEMORAL RUNOFF TECHNIQUE: Multidetector CT imaging of the abdomen, pelvis and lower extremities was performed using the standard protocol during bolus administration of intravenous contrast. Multiplanar CT image reconstructions and MIPs were obtained to evaluate the vascular anatomy. RADIATION DOSE REDUCTION: This exam was performed according to the departmental dose-optimization program which includes automated exposure control, adjustment of the mA and/or kV according to patient size and/or use of iterative reconstruction technique. CONTRAST:  125m OMNIPAQUE IOHEXOL 350 MG/ML SOLN COMPARISON:  None Available. FINDINGS: VASCULAR Aorta: There is no significant atherosclerotic plaque within the normal caliber abdominal aorta. No evidence of abdominal aortic dissection or perivascular stranding. Celiac: Widely patent without hemodynamically significant narrowing. The left gastric artery is incidentally noted to arise directly from the abdominal aorta and gives rise to a accessory left hepatic artery. Otherwise, conventional branching pattern. SMA: Widely patent without hemodynamically significant narrowing. Conventional branching pattern. The distal tributaries of the SMA appear widely patent without discrete intraluminal filling defect to suggest distal embolism. Renals: Solitary bilaterally; widely patent without hemodynamically significant narrowing. No vessel irregularity to suggest FMD. IMA: Widely patent without hemodynamically significant narrowing. RIGHT Lower Extremity Inflow: The right common, external and internal iliac arteries are of normal caliber and widely patent without a hemodynamically significant narrowing. Outflow: The right common, deep and superficial femoral arteries are of normal caliber and widely patent  without hemodynamically significant narrowing. The right above and below-knee popliteal arteries are of normal caliber and widely patent without hemodynamically significant narrowing. Runoff: Three-vessel runoff to the right lower leg and foot. The right-sided dorsalis pedis artery is patent to the level of the forefoot. No discrete lumen filling defects to suggest distal embolism. LEFT Lower Extremity Inflow: The left common, external and internal iliac arteries are widely patent without hemodynamically significant narrowing. Outflow: The left common, deep and superficial femoral arteries are of normal caliber and widely patent without hemodynamically significant narrowing. The left above and below-knee popliteal arteries are of normal caliber and widely patent without hemodynamically significant narrowing. Runoff: The left below-knee popliteal artery scratch the three-vessel runoff to the left lower leg and foot. The left-sided dorsalis pedis artery is patent to the level of the forefoot. No discrete lumen filling defects  to suggest distal embolism. Veins: The IVC and pelvic venous systems appear widely patent on this arterial phase examination. Review of the MIP images confirms the above findings. _________________________________________________________ NON-VASCULAR Evaluation of abdominal organs is limited to the arterial phase of enhancement. Lower chest: Limited visualization of the lower thorax is negative for focal airspace opacity or pleural effusion. Normal heart size.  No pericardial effusion. Hepatobiliary: Mild nodularity of the hepatic contour. No discrete hyperenhancing hepatic lesions. Trace amount of peri hepatic ascites. Post cholecystectomy. No intra or extrahepatic biliary ductal dilatation. Pancreas: Atrophic. Spleen: Normal appearance of the spleen. Adrenals/Urinary Tract: There is symmetric enhancement of the bilateral kidneys. No evidence of nephrolithiasis on this postcontrast examination.  No discrete renal lesions. No urine obstruction or perinephric stranding. Normal appearance the bilateral adrenal glands. There is mild diffuse thickening the urinary bladder wall, potentially accentuated due to underdistention. Stomach/Bowel: Sequela of previous gastric bypass surgery. Moderate to large colonic stool burden without evidence of enteric obstruction. Colonic wall thickening is nonspecific in the presence of intra-abdominal ascites. Normal appearance of the terminal ileum. The appendix is not visualized, however there is no pericecal inflammatory change. No pneumoperitoneum pneumatosis or portal venous gas. Lymphatic: No bulky retroperitoneal, mesenteric, pelvic or inguinal lymphadenopathy. Reproductive: Normal appearance of the pelvic organs. No discrete adnexal lesions. Other: Diffuse body wall anasarca, most conspicuous about the lower extremities bilaterally. Dystrophic calcification about the midline of the ventral abdomen may be postoperative in etiology. Serpiginous structure deep to the umbilicus may represent the sequela of previously recanalized periumbilical vein. Musculoskeletal: Post L4-L5 paraspinal fusion and intervertebral disc space replacement without evidence of hardware failure loosening. 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 IMPRESSION: VASCULAR 1. Normal CTA run-off. 2. Normal three-vessel runoff to the bilateral lower legs and feet. The dorsalis pedis arteries are patent bilaterally. No evidence of distal embolism. NON-VASCULAR 1. Mild nodularity of the hepatic contour with minimal amount of intra-abdominal ascites, nonspecific though suggestive of cirrhosis with early portal venous hypertension. Correlation with LFTs is advised. 2. Diffuse body wall anasarca most conspicuous about the lower extremities bilaterally, nonspecific though could be seen in the setting heart failure/third spacing. Clinical correlation is advised. 3.  Mild diffuse colonic wall thickening, nonspecific in the presence of intraabdominal ascites. 4. Sequela of previous gastric bypass surgery without evidence of enteric obstruction. Electronically Signed   By: Sandi Mariscal M.D.   On: 04/02/2022 15:47   US Venous Img Lower Bilateral  Result Date: 04/02/2022 CLINICAL DATA:  Bilateral lower extremity pain and edema. Evaluate for DVT. EXAM: BILATERAL LOWER EXTREMITY VENOUS DOPPLER ULTRASOUND TECHNIQUE: Gray-scale sonography with graded compression, as well as color Doppler and duplex ultrasound were performed to evaluate the lower extremity deep venous systems from the level of the common femoral vein and including the common femoral, femoral, profunda femoral, popliteal and calf veins including the posterior tibial, peroneal and gastrocnemius veins when visible. The superficial great saphenous vein was also interrogated. Spectral Doppler was utilized to evaluate flow at rest and with distal augmentation maneuvers in the common femoral, femoral and popliteal veins. COMPARISON:  None Available. FINDINGS: RIGHT LOWER EXTREMITY Common Femoral Vein: No evidence of thrombus. Normal compressibility, respiratory phasicity and response to augmentation. Saphenofemoral Junction: No evidence of thrombus. Normal compressibility and flow on color Doppler imaging. Profunda Femoral Vein: No evidence of thrombus. Normal compressibility and flow on color Doppler imaging. Femoral Vein: No evidence of thrombus. Normal compressibility, respiratory phasicity and response to augmentation. Popliteal  Vein: No evidence of thrombus. Normal compressibility, respiratory phasicity and response to augmentation. Calf Veins: Appear patent where visualized. Superficial Great Saphenous Vein: No evidence of thrombus. Normal compressibility. Other Findings: There is a moderate amount of subcutaneous edema at the level of the right lower leg and calf. LEFT LOWER EXTREMITY Common Femoral Vein: No evidence  of thrombus. Normal compressibility, respiratory phasicity and response to augmentation. Saphenofemoral Junction: No evidence of thrombus. Normal compressibility and flow on color Doppler imaging. Profunda Femoral Vein: No evidence of thrombus. Normal compressibility and flow on color Doppler imaging. Femoral Vein: No evidence of thrombus. Normal compressibility, respiratory phasicity and response to augmentation. Popliteal Vein: No evidence of thrombus. Normal compressibility, respiratory phasicity and response to augmentation. Calf Veins: Appear patent where visualized. Superficial Great Saphenous Vein: No evidence of thrombus. Normal compressibility. Other Findings: There is a moderate amount of subcutaneous edema at the level of the left lower leg and calf. IMPRESSION: No evidence of DVT within either lower extremity. Electronically Signed   By: Sandi Mariscal M.D.   On: 04/02/2022 14:19    Microbiology: Results for orders placed or performed during the hospital encounter of 04/02/22  Urine Culture     Status: Abnormal   Collection Time: 04/02/22  9:03 AM   Specimen: Urine, Clean Catch  Result Value Ref Range Status   Specimen Description   Final    URINE, CLEAN CATCH Performed at [facility], [address] [place], [place] [address]    Special Requests   Final    NONE Performed at [facility], [address] [place], [place] [address]    Culture 60,000 COLONIES/mL ESCHERICHIA COLI (A)  Final   Report Status 04/05/2022 FINAL  Final   Organism ID, Bacteria ESCHERICHIA COLI (A)  Final      Susceptibility   Escherichia coli - MIC*    AMPICILLIN >=32 RESISTANT Resistant     CEFAZOLIN 16 SENSITIVE Sensitive     CEFEPIME <=0.12 SENSITIVE Sensitive     CEFTRIAXONE <=0.25 SENSITIVE Sensitive     CIPROFLOXACIN <=0.25 SENSITIVE Sensitive     GENTAMICIN <=1 SENSITIVE Sensitive     IMIPENEM <=0.25 SENSITIVE Sensitive     NITROFURANTOIN <=16 SENSITIVE Sensitive      TRIMETH/SULFA <=20 SENSITIVE Sensitive     AMPICILLIN/SULBACTAM >=32 RESISTANT Resistant     PIP/TAZO <=4 SENSITIVE Sensitive     * 60,000 COLONIES/mL ESCHERICHIA COLI  Culture, blood (Routine X 2) w Reflex to ID Panel     Status: None (Preliminary result)   Collection Time: 04/02/22  9:33 PM   Specimen: BLOOD  Result Value Ref Range Status   Specimen Description BLOOD LEFT ANTECUBITAL  Final   Special Requests   Final    BOTTLES DRAWN AEROBIC AND ANAEROBIC Blood Culture adequate volume   Culture   Final    NO GROWTH 4 DAYS Performed at [facility], [address], [place], [place] [address]    Report Status PENDING  Incomplete  Culture, blood (Routine X 2) w Reflex to ID Panel     Status: None (Preliminary result)   Collection Time: 04/02/22  9:39 PM   Specimen: BLOOD  Result Value Ref Range Status   Specimen Description BLOOD RIGHT ANTECUBITAL  Final   Special Requests   Final    BOTTLES DRAWN AEROBIC AND ANAEROBIC Blood Culture adequate volume   Culture   Final    NO GROWTH 4 DAYS Performed at [facility], [address] [place], [place] [address]    Report Status PENDING  Incomplete    Labs: CBC: Recent Labs  Lab 04/02/22 0903 04/03/22 0726  WBC 7.4 7.3  NEUTROABS 3.9  --   HGB 9.6* 8.3*  HCT 29.9* 25.8*  MCV 87.2 85.7  PLT 314 920   Basic Metabolic Panel: Recent Labs  Lab 04/02/22 0903 04/02/22 2135 04/03/22 0726  NA 138  --  138  K 2.9*  --  3.9  CL 103  --  108  CO2 27  --  25  GLUCOSE 188*  --  99  BUN 14  --  10  CREATININE 0.84  --  0.62  CALCIUM 8.3*  --  7.8*  MG 1.7  --   --   PHOS  --  3.4  --    Liver Function Tests: Recent Labs  Lab 04/02/22 0903  AST 41  ALT 31  ALKPHOS 327*  BILITOT 1.4*  PROT 6.3*  ALBUMIN 1.8*   CBG: Recent Labs  Lab 04/05/22 1757 04/05/22 2222 04/06/22 0728 04/06/22 0757 04/06/22 1158  GLUCAP 72 60* 52* 113* 84    Discharge time spent: greater than 30  minutes.  Signed: Fritzi Mandes, MD [facility] 04/06/2022

## 2022-04-06 NOTE — TOC Transition Note (Signed)
Transition of Care [facility]) - CM/SW Discharge Note   Patient Details  Name: Stephanie Levy MRN: [id number] Date of Birth: July 31, 1981  [facility]) CM/SW Contact:  Margarito Liner, LCSW Phone Number: 04/06/2022, 4:04 PM   Clinical Narrative:   Patient has orders to discharge home today. Was unable to secure home health due to patient's insurance. Patient is agreeable to outpatient PT at [facility]. Will fax order form. No further concerns. CSW signing off.  Final next level of care: Home/Self Care Barriers to Discharge: No Barriers Identified   Patient Goals and CMS Choice        Discharge Placement                Patient to be transferred to facility by: Wife   Patient and family notified of of transfer: 04/06/22  Discharge Plan and Services                                     Social Determinants of Health (SDOH) Interventions     Readmission Risk Interventions     No data to display

## 2022-04-06 NOTE — Assessment & Plan Note (Signed)
Severe malnutrition per RD eval --follow recommendatuions

## 2022-04-07 LAB — CULTURE, BLOOD (ROUTINE X 2)
Culture: NO GROWTH
Culture: NO GROWTH
Special Requests: ADEQUATE
Special Requests: ADEQUATE

## 2022-04-29 ENCOUNTER — Emergency Department: Payer: BC Managed Care – PPO

## 2022-04-29 ENCOUNTER — Inpatient Hospital Stay
Admission: EM | Admit: 2022-04-29 | Discharge: 2022-05-01 | DRG: 602 | Disposition: A | Discharge status: Home / Self Care | Payer: BC Managed Care – PPO | Attending: Internal Medicine | Admitting: Internal Medicine

## 2022-04-29 ENCOUNTER — Other Ambulatory Visit: Payer: Self-pay

## 2022-04-29 ENCOUNTER — Inpatient Hospital Stay: Payer: BC Managed Care – PPO

## 2022-04-29 ENCOUNTER — Encounter: Payer: Self-pay | Admitting: Emergency Medicine

## 2022-04-29 DIAGNOSIS — E860 Dehydration: Secondary | ICD-10-CM | POA: Diagnosis present

## 2022-04-29 DIAGNOSIS — K7031 Alcoholic cirrhosis of liver with ascites: Secondary | ICD-10-CM | POA: Diagnosis present

## 2022-04-29 DIAGNOSIS — D649 Anemia, unspecified: Secondary | ICD-10-CM | POA: Diagnosis not present

## 2022-04-29 DIAGNOSIS — Z833 Family history of diabetes mellitus: Secondary | ICD-10-CM | POA: Diagnosis not present

## 2022-04-29 DIAGNOSIS — F1011 Alcohol abuse, in remission: Secondary | ICD-10-CM | POA: Diagnosis present

## 2022-04-29 DIAGNOSIS — Z9884 Bariatric surgery status: Secondary | ICD-10-CM | POA: Diagnosis not present

## 2022-04-29 DIAGNOSIS — I776 Arteritis, unspecified: Secondary | ICD-10-CM | POA: Diagnosis present

## 2022-04-29 DIAGNOSIS — Z813 Family history of other psychoactive substance abuse and dependence: Secondary | ICD-10-CM | POA: Diagnosis not present

## 2022-04-29 DIAGNOSIS — Z681 Body mass index (BMI) 19 or less, adult: Secondary | ICD-10-CM

## 2022-04-29 DIAGNOSIS — F418 Other specified anxiety disorders: Secondary | ICD-10-CM

## 2022-04-29 DIAGNOSIS — K8689 Other specified diseases of pancreas: Secondary | ICD-10-CM | POA: Diagnosis present

## 2022-04-29 DIAGNOSIS — Z825 Family history of asthma and other chronic lower respiratory diseases: Secondary | ICD-10-CM

## 2022-04-29 DIAGNOSIS — L03116 Cellulitis of left lower limb: Secondary | ICD-10-CM | POA: Diagnosis not present

## 2022-04-29 DIAGNOSIS — F909 Attention-deficit hyperactivity disorder, unspecified type: Secondary | ICD-10-CM | POA: Diagnosis not present

## 2022-04-29 DIAGNOSIS — I5A Non-ischemic myocardial injury (non-traumatic): Secondary | ICD-10-CM | POA: Diagnosis not present

## 2022-04-29 DIAGNOSIS — Z8249 Family history of ischemic heart disease and other diseases of the circulatory system: Secondary | ICD-10-CM | POA: Diagnosis not present

## 2022-04-29 DIAGNOSIS — F1010 Alcohol abuse, uncomplicated: Secondary | ICD-10-CM

## 2022-04-29 DIAGNOSIS — I10 Essential (primary) hypertension: Secondary | ICD-10-CM

## 2022-04-29 DIAGNOSIS — Z9049 Acquired absence of other specified parts of digestive tract: Secondary | ICD-10-CM | POA: Diagnosis not present

## 2022-04-29 DIAGNOSIS — E440 Moderate protein-calorie malnutrition: Secondary | ICD-10-CM | POA: Diagnosis present

## 2022-04-29 DIAGNOSIS — E119 Type 2 diabetes mellitus without complications: Secondary | ICD-10-CM | POA: Diagnosis not present

## 2022-04-29 DIAGNOSIS — E43 Unspecified severe protein-calorie malnutrition: Secondary | ICD-10-CM | POA: Diagnosis present

## 2022-04-29 DIAGNOSIS — L03119 Cellulitis of unspecified part of limb: Principal | ICD-10-CM

## 2022-04-29 DIAGNOSIS — L88 Pyoderma gangrenosum: Secondary | ICD-10-CM | POA: Diagnosis present

## 2022-04-29 DIAGNOSIS — F32A Depression, unspecified: Secondary | ICD-10-CM | POA: Diagnosis present

## 2022-04-29 DIAGNOSIS — R7989 Other specified abnormal findings of blood chemistry: Secondary | ICD-10-CM | POA: Diagnosis present

## 2022-04-29 DIAGNOSIS — E8809 Other disorders of plasma-protein metabolism, not elsewhere classified: Secondary | ICD-10-CM | POA: Diagnosis present

## 2022-04-29 DIAGNOSIS — F419 Anxiety disorder, unspecified: Secondary | ICD-10-CM | POA: Diagnosis present

## 2022-04-29 DIAGNOSIS — Z811 Family history of alcohol abuse and dependence: Secondary | ICD-10-CM

## 2022-04-29 DIAGNOSIS — R079 Chest pain, unspecified: Secondary | ICD-10-CM | POA: Insufficient documentation

## 2022-04-29 DIAGNOSIS — R748 Abnormal levels of other serum enzymes: Secondary | ICD-10-CM | POA: Diagnosis present

## 2022-04-29 DIAGNOSIS — E876 Hypokalemia: Secondary | ICD-10-CM | POA: Diagnosis present

## 2022-04-29 DIAGNOSIS — L989 Disorder of the skin and subcutaneous tissue, unspecified: Secondary | ICD-10-CM | POA: Diagnosis not present

## 2022-04-29 DIAGNOSIS — L03115 Cellulitis of right lower limb: Secondary | ICD-10-CM | POA: Diagnosis not present

## 2022-04-29 DIAGNOSIS — L988 Other specified disorders of the skin and subcutaneous tissue: Secondary | ICD-10-CM | POA: Diagnosis not present

## 2022-04-29 DIAGNOSIS — K869 Disease of pancreas, unspecified: Secondary | ICD-10-CM | POA: Diagnosis not present

## 2022-04-29 DIAGNOSIS — Z7982 Long term (current) use of aspirin: Secondary | ICD-10-CM

## 2022-04-29 LAB — TROPONIN I (HIGH SENSITIVITY)
Troponin I (High Sensitivity): 21 ng/L — ABNORMAL HIGH (ref <18)
Troponin I (High Sensitivity): 18 ng/L — ABNORMAL HIGH (ref <18)
Troponin I (High Sensitivity): 16 ng/L (ref <18)

## 2022-04-29 LAB — BASIC METABOLIC PANEL
Anion gap: 8 (ref 5–15)
BUN: 11 mg/dL (ref 6–20)
CO2: 23 mmol/L (ref 22–32)
Calcium: 7.4 mg/dL — ABNORMAL LOW (ref 8.9–10.3)
Chloride: 105 mmol/L (ref 98–111)
Creatinine, Ser: 0.9 mg/dL (ref 0.44–1.00)
GFR, Estimated: >60 mL/min (ref >60)
Glucose, Bld: 204 mg/dL — ABNORMAL HIGH (ref 70–99)
Potassium: 4.1 mmol/L (ref 3.5–5.1)
Sodium: 136 mmol/L (ref 135–145)

## 2022-04-29 LAB — CBC
HCT: 29.7 % — ABNORMAL LOW (ref 36.0–46.0)
Hemoglobin: 9.7 g/dL — ABNORMAL LOW (ref 12.0–15.0)
MCH: 27.1 pg (ref 26.0–34.0)
MCHC: 32.7 g/dL (ref 30.0–36.0)
MCV: 83 fL (ref 80.0–100.0)
Platelets: 304 10*3/uL (ref 150–400)
RBC: 3.58 MIL/uL — ABNORMAL LOW (ref 3.87–5.11)
RDW: 17.2 % — ABNORMAL HIGH (ref 11.5–15.5)
WBC: 10.2 10*3/uL (ref 4.0–10.5)
nRBC: 0.2 % (ref 0.0–0.2)

## 2022-04-29 LAB — HEPATIC FUNCTION PANEL
ALT: 25 U/L (ref 0–44)
AST: 37 U/L (ref 15–41)
Albumin: <1.5 g/dL — ABNORMAL LOW (ref 3.5–5.0)
Alkaline Phosphatase: 338 U/L — ABNORMAL HIGH (ref 38–126)
Bilirubin, Direct: 0.5 mg/dL — ABNORMAL HIGH (ref 0.0–0.2)
Indirect Bilirubin: 0.5 mg/dL (ref 0.3–0.9)
Total Bilirubin: 1 mg/dL (ref 0.3–1.2)
Total Protein: 5.3 g/dL — ABNORMAL LOW (ref 6.5–8.1)

## 2022-04-29 LAB — LITHIUM LEVEL: Lithium Lvl: <0.06 mmol/L — ABNORMAL LOW (ref 0.60–1.20)

## 2022-04-29 LAB — LACTIC ACID, PLASMA
Lactic Acid, Venous: 3.2 mmol/L (ref 0.5–1.9)
Lactic Acid, Venous: 2.9 mmol/L (ref 0.5–1.9)

## 2022-04-29 LAB — APTT: aPTT: 38 seconds — ABNORMAL HIGH (ref 24–36)

## 2022-04-29 LAB — CBG MONITORING, ED: Glucose-Capillary: 187 mg/dL — ABNORMAL HIGH (ref 70–99)

## 2022-04-29 LAB — LIPID PANEL
Cholesterol: 210 mg/dL — ABNORMAL HIGH (ref 0–200)
HDL: 13 mg/dL — ABNORMAL LOW (ref >40)
LDL Cholesterol: 158 mg/dL — ABNORMAL HIGH (ref 0–99)
Total CHOL/HDL Ratio: 16.2 RATIO
Triglycerides: 194 mg/dL — ABNORMAL HIGH (ref <150)
VLDL: 39 mg/dL (ref 0–40)

## 2022-04-29 LAB — AMMONIA: Ammonia: 56 umol/L — ABNORMAL HIGH (ref 9–35)

## 2022-04-29 LAB — SEDIMENTATION RATE: Sed Rate: 33 mm/hr — ABNORMAL HIGH (ref 0–20)

## 2022-04-29 LAB — PROTIME-INR
INR: 1.3 — ABNORMAL HIGH (ref 0.8–1.2)
Prothrombin Time: 15.9 seconds — ABNORMAL HIGH (ref 11.4–15.2)

## 2022-04-29 LAB — GLUCOSE, CAPILLARY
Glucose-Capillary: 138 mg/dL — ABNORMAL HIGH (ref 70–99)
Glucose-Capillary: 78 mg/dL (ref 70–99)
Glucose-Capillary: 92 mg/dL (ref 70–99)

## 2022-04-29 LAB — HEMOGLOBIN A1C
Hgb A1c MFr Bld: 6.3 % — ABNORMAL HIGH (ref 4.8–5.6)
Mean Plasma Glucose: 134.11 mg/dL

## 2022-04-29 LAB — MAGNESIUM: Magnesium: 1.5 mg/dL — ABNORMAL LOW (ref 1.7–2.4)

## 2022-04-29 LAB — BRAIN NATRIURETIC PEPTIDE: B Natriuretic Peptide: 58.1 pg/mL (ref 0.0–100.0)

## 2022-04-29 LAB — LIPASE, BLOOD: Lipase: 16 U/L (ref 11–51)

## 2022-04-29 LAB — PROCALCITONIN: Procalcitonin: 0.75 ng/mL

## 2022-04-29 MED ORDER — AMPHETAMINE-DEXTROAMPHETAMINE 10 MG PO TABS: 20.0000 mg | ORAL_TABLET | Freq: Three times a day (TID) | ORAL | Status: DC

## 2022-04-29 MED ORDER — NALTREXONE HCL 50 MG PO TABS
50.0000 mg | ORAL_TABLET | Freq: Every day | ORAL | Status: DC
  Administered 2022-04-29: 50 mg via ORAL
  Filled 2022-04-29: qty 1

## 2022-04-29 MED ORDER — LACTULOSE 10 GM/15ML PO SOLN
20.0000 g | Freq: Two times a day (BID) | ORAL | Status: DC
  Administered 2022-04-29 – 2022-05-01 (×3): 20 g via ORAL
  Filled 2022-04-29 (×3): qty 30

## 2022-04-29 MED ORDER — MAGNESIUM SULFATE 2 GM/50ML IV SOLN
2.0000 g | Freq: Once | INTRAVENOUS | Status: AC
  Administered 2022-04-29: 2 g via INTRAVENOUS
  Filled 2022-04-29: qty 50

## 2022-04-29 MED ORDER — ASCORBIC ACID 500 MG PO TABS
500.0000 mg | ORAL_TABLET | Freq: Two times a day (BID) | ORAL | Status: DC
  Administered 2022-04-29 – 2022-05-01 (×5): 500 mg via ORAL
  Filled 2022-04-29 (×5): qty 1

## 2022-04-29 MED ORDER — HYDROMORPHONE HCL 1 MG/ML IJ SOLN
1.0000 mg | Freq: Once | INTRAMUSCULAR | Status: AC
  Administered 2022-04-29: 1 mg via INTRAVENOUS
  Filled 2022-04-29: qty 1

## 2022-04-29 MED ORDER — SODIUM CHLORIDE 0.9 % IV SOLN
1.0000 g | INTRAVENOUS | Status: DC
  Administered 2022-04-30: 1 g via INTRAVENOUS
  Filled 2022-04-29: qty 1

## 2022-04-29 MED ORDER — ALBUTEROL SULFATE (2.5 MG/3ML) 0.083% IN NEBU: 3.0000 mL | INHALATION_SOLUTION | RESPIRATORY_TRACT | Status: DC | PRN

## 2022-04-29 MED ORDER — ENSURE ENLIVE PO LIQD
237.0000 mL | Freq: Three times a day (TID) | ORAL | Status: DC
  Administered 2022-04-29 (×3): 237 mL via ORAL

## 2022-04-29 MED ORDER — INSULIN ASPART 100 UNIT/ML IJ SOLN: 0.0000 [IU] | Freq: Every day | INTRAMUSCULAR | Status: DC

## 2022-04-29 MED ORDER — IOHEXOL 350 MG/ML SOLN
100.0000 mL | Freq: Once | INTRAVENOUS | Status: AC | PRN
  Administered 2022-04-29: 100 mL via INTRAVENOUS

## 2022-04-29 MED ORDER — INSULIN ASPART 100 UNIT/ML IJ SOLN
0.0000 [IU] | Freq: Three times a day (TID) | INTRAMUSCULAR | Status: DC
  Administered 2022-04-29: 2 [IU] via SUBCUTANEOUS
  Administered 2022-04-29: 1 [IU] via SUBCUTANEOUS
  Filled 2022-04-29 (×2): qty 1

## 2022-04-29 MED ORDER — ASPIRIN 81 MG PO TBEC
81.0000 mg | DELAYED_RELEASE_TABLET | Freq: Every day | ORAL | Status: DC
  Administered 2022-04-29 – 2022-05-01 (×3): 81 mg via ORAL
  Filled 2022-04-29 (×4): qty 1

## 2022-04-29 MED ORDER — NITROGLYCERIN 0.4 MG SL SUBL: 0.4000 mg | SUBLINGUAL_TABLET | SUBLINGUAL | Status: DC | PRN

## 2022-04-29 MED ORDER — HYDRALAZINE HCL 20 MG/ML IJ SOLN
5.0000 mg | INTRAMUSCULAR | Status: DC | PRN
Start: 2022-04-29 — End: 2022-05-01

## 2022-04-29 MED ORDER — OXYCODONE HCL 5 MG PO TABS: 5.0000 mg | ORAL_TABLET | Freq: Four times a day (QID) | ORAL | Status: DC | PRN

## 2022-04-29 MED ORDER — SODIUM CHLORIDE 0.9 % IV SOLN: INTRAVENOUS | Status: DC

## 2022-04-29 MED ORDER — DM-GUAIFENESIN ER 30-600 MG PO TB12: 1.0000 | ORAL_TABLET | Freq: Two times a day (BID) | ORAL | Status: DC | PRN

## 2022-04-29 MED ORDER — OXYCODONE HCL 5 MG PO TABS
5.0000 mg | ORAL_TABLET | Freq: Four times a day (QID) | ORAL | Status: DC | PRN
  Administered 2022-04-29 (×2): 5 mg via ORAL
  Filled 2022-04-29 (×2): qty 1

## 2022-04-29 MED ORDER — CLONAZEPAM 0.25 MG PO TBDP
0.2500 mg | ORAL_TABLET | Freq: Two times a day (BID) | ORAL | Status: DC | PRN
Start: 2022-04-29 — End: 2022-04-29

## 2022-04-29 MED ORDER — ACETAMINOPHEN 325 MG PO TABS
650.0000 mg | ORAL_TABLET | Freq: Four times a day (QID) | ORAL | Status: DC | PRN
  Administered 2022-04-29: 650 mg via ORAL
  Filled 2022-04-29: qty 2

## 2022-04-29 MED ORDER — ADULT MULTIVITAMIN W/MINERALS CH
1.0000 | ORAL_TABLET | Freq: Every day | ORAL | Status: DC
Start: 2022-04-29 — End: 2022-05-01
  Administered 2022-04-29 – 2022-05-01 (×3): 1 via ORAL
  Filled 2022-04-29 (×3): qty 1

## 2022-04-29 MED ORDER — ENOXAPARIN SODIUM 40 MG/0.4ML IJ SOSY
40.0000 mg | PREFILLED_SYRINGE | INTRAMUSCULAR | Status: DC
Start: 2022-04-29 — End: 2022-05-01
  Administered 2022-04-29 – 2022-05-01 (×3): 40 mg via SUBCUTANEOUS
  Filled 2022-04-29 (×3): qty 0.4

## 2022-04-29 MED ORDER — CLONAZEPAM 0.125 MG PO TBDP: 0.2500 mg | ORAL_TABLET | Freq: Two times a day (BID) | ORAL | Status: DC | PRN

## 2022-04-29 MED ORDER — TRAZODONE HCL 50 MG PO TABS: 50.0000 mg | ORAL_TABLET | Freq: Every evening | ORAL | Status: DC | PRN

## 2022-04-29 MED ORDER — LITHIUM CARBONATE 150 MG PO CAPS
150.0000 mg | ORAL_CAPSULE | Freq: Every day | ORAL | Status: DC
  Administered 2022-04-29 – 2022-05-01 (×3): 150 mg via ORAL
  Filled 2022-04-29 (×3): qty 1

## 2022-04-29 MED ORDER — GABAPENTIN 300 MG PO CAPS
300.0000 mg | ORAL_CAPSULE | Freq: Two times a day (BID) | ORAL | Status: DC
  Administered 2022-04-29 – 2022-05-01 (×5): 300 mg via ORAL
  Filled 2022-04-29 (×5): qty 1

## 2022-04-29 MED ORDER — DIPHENHYDRAMINE HCL 50 MG/ML IJ SOLN: 12.5000 mg | Freq: Three times a day (TID) | INTRAMUSCULAR | Status: DC | PRN

## 2022-04-29 MED ORDER — SODIUM CHLORIDE 0.9 % IV SOLN
1.0000 g | Freq: Once | INTRAVENOUS | Status: AC
  Administered 2022-04-29: 1 g via INTRAVENOUS

## 2022-04-29 NOTE — Assessment & Plan Note (Signed)
CT scan showed severe atrophy throughout the pancreas with multiple small low-attenuation lesions, largest of which is in the pancreatic tail measuring 1.1 x 1.5 cm. 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 -need to f/u with PCP

## 2022-04-29 NOTE — ED Notes (Signed)
Lab called to add on lactic. State they will put into process at this time.

## 2022-04-29 NOTE — Assessment & Plan Note (Addendum)
Mg 1.5>>2.0 -Continue to monitor

## 2022-04-29 NOTE — Assessment & Plan Note (Addendum)
Troponin level 21>>16. Patient reports pleuritic chest pain. CT angiogram is negative for PE.  Chest x-ray showed possible left lower lobe infiltration, but the patient denies shortness of breath or cough, no fever or leukocytosis, low suspicions of pneumonia. -Start aspirin 81 mg daily -As needed nitroglycerin -Lipid panel with elevated total and triglycerides, LDL at 158. -A1c of 6.3 -Start her on statin, T. bili, AST and ALT within normal limit, some elevation of alkaline phosphatase which will need monitoring.

## 2022-04-29 NOTE — Assessment & Plan Note (Addendum)
Patient does not meets criteria for sepsis. 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 CRP negative, minimally elevated ESR.  Blood cultures remain negative. Hep C antibodies checked in 2019 was negative. -Recheck for hepatitis C -Consulted general surgery for skin biopsy. -Stop antibiotics

## 2022-04-29 NOTE — Assessment & Plan Note (Addendum)
Pt is not taking Adderall now. Lithium levels were low, most likely noncompliance. -continue home Lithium

## 2022-04-29 NOTE — Assessment & Plan Note (Signed)
Hemoglobin stable, 9.7 (8.3 on 04/03/2022. -Follow-up by CBC

## 2022-04-29 NOTE — ED Notes (Signed)
Lab notified of critical latic 3.2

## 2022-04-29 NOTE — ED Provider Notes (Signed)
[facility] Provider Note    Event Date/Time   First MD Initiated Contact with Patient 04/29/22 [phone]     (approximate)   History   Chest Pain, Leg Swelling, and Shortness of Breath   HPI  Stephanie Levy is a 41 y.o. female who presents to the ED for evaluation of Chest Pain, Leg Swelling, and Shortness of Breath   I reviewed medical DC summary from 7/4.  History of alcohol abuse and associated cirrhosis, DM.  was admitted for cellulitis.  Patient presents to the ED for the ration of 2 days of worsening wounds to her legs, weeping, short of breath, chest pain.  Nausea and abdominal pain.  Physical Exam   Triage Vital Signs: ED Triage Vitals  Enc Vitals Group     BP 04/29/22 0453 (!) 114/93     Pulse Rate 04/29/22 0453 (!) 112     Resp 04/29/22 0453 18     Temp 04/29/22 0453 98.3 F (36.8 C)     Temp Source 04/29/22 0453 Oral     SpO2 04/29/22 0453 100 %     Weight 04/29/22 0445 130 lb (59 kg)     Height 04/29/22 0445 5\' 8"  (1.727 m)     Head Circumference --      Peak Flow --      Pain Score 04/29/22 0444 9     Pain Loc --      Pain Edu? --      Excl. in GC? --     Most recent vital signs: Vitals:   04/29/22 0530 04/29/22 0600  BP: 110/79 104/79  Pulse: (!) 101 (!) 109  Resp: (!) 9 11  Temp:    SpO2: 99% 94%    General: Awake, no distress.  Chronically ill-appearing. CV:  Good peripheral perfusion.  Resp:  Normal effort.  Abd:  No distention.  Mild distention and tender throughout.  No peritoneal features. MSK:  No deformity noted.  Erythematous wounds that are flat to the bilateral lower extremities, as below. Neuro:  No focal deficits appreciated. Other:        ED Results / Procedures / Treatments   Labs (all labs ordered are listed, but only abnormal results are displayed) Labs Reviewed  BASIC METABOLIC PANEL - Abnormal; Notable for the following components:      Result Value   Glucose, Bld 204 (*)    Calcium 7.4 (*)     All other components within normal limits  CBC - Abnormal; Notable for the following components:   RBC 3.58 (*)    Hemoglobin 9.7 (*)    HCT 29.7 (*)    RDW 17.2 (*)    All other components within normal limits  HEPATIC FUNCTION PANEL - Abnormal; Notable for the following components:   Total Protein 5.3 (*)    Albumin <1.5 (*)    Alkaline Phosphatase 338 (*)    Bilirubin, Direct 0.5 (*)    All other components within normal limits  PROTIME-INR - Abnormal; Notable for the following components:   Prothrombin Time 15.9 (*)    INR 1.3 (*)    All other components within normal limits  TROPONIN I (HIGH SENSITIVITY) - Abnormal; Notable for the following components:   Troponin I (High Sensitivity) 21 (*)    All other components within normal limits  LIPASE, BLOOD  BRAIN NATRIURETIC PEPTIDE  PROCALCITONIN  AMMONIA  LACTIC ACID, PLASMA  LACTIC ACID, PLASMA  TROPONIN I (HIGH SENSITIVITY)  EKG   RADIOLOGY   Official radiology report(s): CT Angio Chest PE W and/or Wo Contrast  Result Date: 04/29/2022 CLINICAL DATA:  41 year old female with history of chest pain, abdominal pain and emesis. Shortness of breath. Bilateral leg swelling. EXAM: CT ANGIOGRAPHY CHEST CT ABDOMEN AND PELVIS WITH CONTRAST TECHNIQUE: Multidetector CT imaging of the chest was performed using the standard protocol during bolus administration of intravenous contrast. Multiplanar CT image reconstructions and MIPs were obtained to evaluate the vascular anatomy. Multidetector CT imaging of the abdomen and pelvis was performed using the standard protocol during bolus administration of intravenous contrast. RADIATION DOSE REDUCTION: This exam was performed according to the departmental dose-optimization program which includes automated exposure control, adjustment of the mA and/or kV according to patient size and/or use of iterative reconstruction technique. CONTRAST:  OMNIPAQUE IOHEXOL 350 MG/ML SOLN COMPARISON:   CTA of the abdomen and pelvis 04/02/2022. FINDINGS: CTA CHEST FINDINGS Cardiovascular: No filling defects within the pulmonary arterial tree to suggest pulmonary embolism. Heart size is normal. There is no significant pericardial fluid, thickening or pericardial calcification. No atherosclerotic calcifications are noted in the thoracic aorta or the coronary arteries. Mediastinum/Nodes: No pathologically enlarged mediastinal or hilar lymph nodes. Esophagus is unremarkable in appearance. No axillary lymphadenopathy. Lungs/Pleura: Small bilateral pleural effusions (left-greater-than-right) lying dependently. Some passive atelectasis is noted in the dependent portions of the lower lobes of the lungs bilaterally (left-greater-than-right). No acute consolidative airspace disease. No definite suspicious appearing pulmonary nodules or masses are noted. Musculoskeletal: There are no aggressive appearing lytic or blastic lesions noted in the visualized portions of the skeleton. Review of the MIP images confirms the above findings. CT ABDOMEN and PELVIS FINDINGS Hepatobiliary: Liver has a slightly shrunken appearance and nodular contour, indicative of underlying cirrhosis. No discrete cystic or solid hepatic lesions. No intra or extrahepatic biliary ductal dilatation. Status post cholecystectomy. Pancreas: Diffuse atrophy throughout the pancreas. Low-attenuation lesions in the pancreas, most notably in the pancreatic tail where the largest of these lesions measures up to 1.1 x 1.5 cm (axial image 22 of series 2). No peripancreatic fluid collections or surrounding inflammatory changes. Spleen: Unremarkable. Adrenals/Urinary Tract: Bilateral kidneys and bilateral adrenal glands are normal in appearance. No hydroureteronephrosis. Urinary bladder is unremarkable in appearance. 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 No pathologic dilatation of small bowel or colon. Appendix is not confidently  identified, potentially surgically absent. Vascular/Lymphatic: Atherosclerotic disease noted in the pelvic vasculature. No aneurysm or dissection noted in the abdominal or pelvic vasculature. Portal vein is patent measuring 13 mm in the porta hepatis. No lymphadenopathy noted in the abdomen or pelvis. Reproductive: Uterus and ovaries are unremarkable in appearance. Other: Moderate volume of ascites. Mild diffuse mesenteric edema. No pneumoperitoneum. Diffuse body wall edema. Musculoskeletal: Status post PLIF at L4-L5 with interbody graft at L4-L5 interspace. There are no aggressive appearing lytic or blastic lesions noted in the visualized portions of the skeleton. Review of the MIP images confirms the above findings. IMPRESSION: 1. No evidence of pulmonary embolism. 2. Small bilateral pleural effusions (left-greater-than-right) lying dependently with areas of passive atelectasis in the dependent portions of the lungs bilaterally. 3. Hepatic cirrhosis with moderate volume of ascites. 4. There is also diffuse body wall edema. Overall, imaging findings suggest a state of anasarca. 5. Severe atrophy throughout the pancreas with multiple small low-attenuation lesions, largest of which is in the pancreatic tail measuring 1.1 x 1.5 cm. These are nonspecific, and may simply represent small pancreatic pseudocysts or side branch  IPMNs (intraductal papillary mucinous neoplasms), however, follow-up abdominal MRI with and without IV gadolinium with MRCP is recommended in 6 months to ensure the stability of this finding. 6. Additional incidental findings, as above. Electronically Signed   By: Trudie Reed M.D.   On: 04/29/2022 06:46   CT ABDOMEN PELVIS W CONTRAST  Result Date: 04/29/2022 CLINICAL DATA:  41 year old female with history of chest pain, abdominal pain and emesis. Shortness of breath. Bilateral leg swelling. EXAM: CT ANGIOGRAPHY CHEST CT ABDOMEN AND PELVIS WITH CONTRAST TECHNIQUE: Multidetector CT imaging of  the chest was performed using the standard protocol during bolus administration of intravenous contrast. Multiplanar CT image reconstructions and MIPs were obtained to evaluate the vascular anatomy. Multidetector CT imaging of the abdomen and pelvis was performed using the standard protocol during bolus administration of intravenous contrast. RADIATION DOSE REDUCTION: This exam was performed according to the departmental dose-optimization program which includes automated exposure control, adjustment of the mA and/or kV according to patient size and/or use of iterative reconstruction technique. CONTRAST:  OMNIPAQUE IOHEXOL 350 MG/ML SOLN COMPARISON:  CTA of the abdomen and pelvis 04/02/2022. FINDINGS: CTA CHEST FINDINGS Cardiovascular: No filling defects within the pulmonary arterial tree to suggest pulmonary embolism. Heart size is normal. There is no significant pericardial fluid, thickening or pericardial calcification. No atherosclerotic calcifications are noted in the thoracic aorta or the coronary arteries. Mediastinum/Nodes: No pathologically enlarged mediastinal or hilar lymph nodes. Esophagus is unremarkable in appearance. No axillary lymphadenopathy. Lungs/Pleura: Small bilateral pleural effusions (left-greater-than-right) lying dependently. Some passive atelectasis is noted in the dependent portions of the lower lobes of the lungs bilaterally (left-greater-than-right). No acute consolidative airspace disease. No definite suspicious appearing pulmonary nodules or masses are noted. Musculoskeletal: There are no aggressive appearing lytic or blastic lesions noted in the visualized portions of the skeleton. Review of the MIP images confirms the above findings. CT ABDOMEN and PELVIS FINDINGS Hepatobiliary: Liver has a slightly shrunken appearance and nodular contour, indicative of underlying cirrhosis. No discrete cystic or solid hepatic lesions. No intra or extrahepatic biliary ductal dilatation. Status  post cholecystectomy. Pancreas: Diffuse atrophy throughout the pancreas. Low-attenuation lesions in the pancreas, most notably in the pancreatic tail where the largest of these lesions measures up to 1.1 x 1.5 cm (axial image 22 of series 2). No peripancreatic fluid collections or surrounding inflammatory changes. Spleen: Unremarkable. Adrenals/Urinary Tract: Bilateral kidneys and bilateral adrenal glands are normal in appearance. No hydroureteronephrosis. Urinary bladder is unremarkable in appearance. 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 No pathologic dilatation of small bowel or colon. Appendix is not confidently identified, potentially surgically absent. Vascular/Lymphatic: Atherosclerotic disease noted in the pelvic vasculature. No aneurysm or dissection noted in the abdominal or pelvic vasculature. Portal vein is patent measuring 13 mm in the porta hepatis. No lymphadenopathy noted in the abdomen or pelvis. Reproductive: Uterus and ovaries are unremarkable in appearance. Other: Moderate volume of ascites. Mild diffuse mesenteric edema. No pneumoperitoneum. Diffuse body wall edema. Musculoskeletal: Status post PLIF at L4-L5 with interbody graft at L4-L5 interspace. There are no aggressive appearing lytic or blastic lesions noted in the visualized portions of the skeleton. Review of the MIP images confirms the above findings. IMPRESSION: 1. No evidence of pulmonary embolism. 2. Small bilateral pleural effusions (left-greater-than-right) lying dependently with areas of passive atelectasis in the dependent portions of the lungs bilaterally. 3. Hepatic cirrhosis with moderate volume of ascites. 4. There is also diffuse body wall edema. Overall, imaging findings suggest a state of  anasarca. 5. Severe atrophy throughout the pancreas with multiple small low-attenuation lesions, largest of which is in the pancreatic tail measuring 1.1 x 1.5 cm. These are nonspecific, and may simply  represent small pancreatic pseudocysts or side branch IPMNs (intraductal papillary mucinous neoplasms), however, follow-up abdominal MRI with and without IV gadolinium with MRCP is recommended in 6 months to ensure the stability of this finding. 6. Additional incidental findings, as above. Electronically Signed   By: Trudie Reed M.D.   On: 04/29/2022 06:46   DG Chest Portable 1 View  Result Date: 04/29/2022 CLINICAL DATA:  41 year old female with history of chest pain and shortness of breath. EXAM: PORTABLE CHEST 1 VIEW COMPARISON:  Chest x-ray 08/25/2019. FINDINGS: Patchy areas of interstitial prominence an ill-defined airspace disease noted throughout the left mid to lower lung. Right lung is clear. No pleural effusions. No pneumothorax. No evidence of pulmonary edema. Heart size is normal. Upper mediastinal contours are within normal limits. IMPRESSION: 1. Probable left lower lobe bronchopneumonia, as above. Followup PA and lateral chest X-ray is recommended in 3-4 weeks following trial of antibiotic therapy to ensure resolution and exclude underlying malignancy. Electronically Signed   By: Trudie Reed M.D.   On: 04/29/2022 05:29    PROCEDURES and INTERVENTIONS:  Procedures  Medications  cefTRIAXone (ROCEPHIN) 1 g in sodium chloride 0.9 % 100 mL IVPB (has no administration in time range)  HYDROmorphone (DILAUDID) injection 1 mg (1 mg Intravenous Given 04/29/22 0537)  iohexol (OMNIPAQUE) 350 MG/ML injection 100 mL (100 mLs Intravenous Contrast Given 04/29/22 6834)     IMPRESSION / MDM / ASSESSMENT AND PLAN / ED COURSE  I reviewed the triage vital signs and the nursing notes.  Differential diagnosis includes, but is not limited to, cellulitis, DVT  {Patient presents with symptoms of an acute illness or injury that is potentially life-threatening.  41 year old cirrhotic patient presents to the ED with few days of various symptoms.  Reporting chest pain, shortness of breath, abdominal  pain, leg swelling and malaise.  Bilateral leg wounds are concerning for cellulitis.  Her procalcitonin is elevated suggestive of bacterial etiology of disease.  Not meeting sepsis criteria but her tachycardia is noted.  Due to her chest pain or shortness of breath, CTA chest obtained without evidence of PE.  SBP is a possibility and we will cover this for Rocephin.  We will consult medicine for admission.  DVT ultrasound pending.      FINAL CLINICAL IMPRESSION(S) / ED DIAGNOSES   Final diagnoses:  Cellulitis of lower extremity, unspecified laterality     Rx / DC Orders   ED Discharge Orders     None        Note:  This document was prepared using Dragon voice recognition software and may include unintentional dictation errors.   Delton [place], MD 04/29/22 [phone]

## 2022-04-29 NOTE — ED Notes (Addendum)
Patient transported to CT via stretcher.

## 2022-04-29 NOTE — Assessment & Plan Note (Addendum)
Mental status normal.  INR 1.3.  Denies abdominal pain Lactulose was also added for elevated ammonia levels. Patient need to get established with hepatology. -Adding low-dose Lasix and spironolactone for ascites and anasarca. -Giving some albumin secondary to significant hypoalbuminemia

## 2022-04-29 NOTE — Assessment & Plan Note (Signed)
BMI 19.77, body weight 59 kg.  Albumin <1.5 -Continue Ensure -Consult nutrition

## 2022-04-29 NOTE — ED Notes (Signed)
Patient provided with 3 additional warm blankets per request. Pt resting in bed with lights off. Breathing unlabored speaking in full sentences with noted symmetric chest rise and fall. Bed low and locked with side rails raised x2. Call bell in reach and cardiac monitor in place.

## 2022-04-29 NOTE — Assessment & Plan Note (Addendum)
Lactic acid 3.2>>2.9>>1.4.  Patient does not meets criteria for sepsis.  Likely due to dehydration -Encourage p.o. hydration and stop IV fluid now.

## 2022-04-29 NOTE — H&P (Addendum)
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 Communication: not done, no family member is at bed side  Disposition Plan:  Anticipate discharge back to previous environment  Consults called:  none  Admission status and Level of care: Telemetry Medical:    as inpt         Severity of Illness:  The appropriate patient status for this patient is INPATIENT. Inpatient status is judged to be reasonable and necessary in order to provide the required intensity of service to ensure the patient's safety. The patient's presenting symptoms, physical exam findings, and initial radiographic and laboratory data in the context of their chronic comorbidities is felt to place them at high risk for further clinical deterioration. Furthermore, it is not anticipated that the patient will be medically stable for discharge from the hospital within 2 midnights of admission.   * I certify that at the point of admission it is my clinical judgment that  the patient will require inpatient hospital care spanning beyond 2 midnights from the point of admission due to high intensity of service, high risk for further deterioration and high frequency of surveillance required.*       Date of Service 04/29/2022    I[facility]   If 7PM-7AM, please contact night-coverage [email or web address] 04/29/2022, 6:33 PM

## 2022-04-29 NOTE — Assessment & Plan Note (Addendum)
Initially home Lasix was held for concern of dehydration lactic acidosis which has been resolved with IV fluid now. -Restart low-dose Lasix -Add low-dose spironolactone at 25 mg daily -IV hydralazine as needed

## 2022-04-29 NOTE — ED Triage Notes (Signed)
Pt to ED from home c/o mid chest pain that is sharp, SOB, bilateral leg swelling and weeping for x4-5 days.  Pt states nausea and dry heaves and diarrhea.  Pt with hx of cirrhosis.  Appears pale, weak, A&Ox4.  Wounds to bilateral legs and buttocks.

## 2022-04-29 NOTE — Assessment & Plan Note (Addendum)
-  in remission, encouraged to not restart drinking alcohol -pt is not taking naltrexone

## 2022-04-29 NOTE — Assessment & Plan Note (Addendum)
-   Hold Pristiq due to QTc prolongation 537 -Continue home Klonopin

## 2022-04-29 NOTE — Assessment & Plan Note (Addendum)
Recent A1c 6.8.  Patient used to be on Lantus 10 units daily which was discontinued at discharge of previous admission since patient had low blood sugar level -Sliding scale insulin

## 2022-04-29 NOTE — Progress Notes (Signed)
Admission profile updated. ?

## 2022-04-29 NOTE — Consult Note (Signed)
WOC Nurse wound consult note Consultation was completed by review of records, images and assistance from the bedside nurse/clinical staff.  Reason for Consult:LE wounds; reviewed chart patient admitted 04/02/22 with LE wounds and vasitic changes from the groin down bilaterally and edema. Hx of cirrhosis. Admitted for SOB/CP and leg swelling. However the LE edema looks much better than at the time of her last admission Wound type: Vasculitic vs inflammatory. Fungal overgrowth at wound sites. LE wounds improved since last admission other than right lateral thigh   Pressure Injury POA: NA Measurement:scattered over BLE Wound bed: pink, clean, with some serous crust bilateral lateral and medial calves lateral and medial calves  Right lateral thigh partial thickness wounds, clean, pink Bilateral knee wounds, clean, pink.  Color changes from last admission have improved and seem to be localized to her wounds  Drainage (amount, consistency, odor) see nursing flow sheets Periwound: intact  Dressing procedure/placement/frequency:  Cover over areas of the bilateral LEs with single layer of xeroform, wrap with kerlix or cover with foam dressings. Change every other day.   Follow up in a wound care center of the patient's choice would be ideal to determine etiology. Or punch biopsy to determine etiology.   Not appropriate for compression therapy at this time.    Re consult if needed, will not follow at this time. Thanks  Keedan Sample [employer], RN,CWOCN, CNS, CWON-AP [phone])

## 2022-04-29 NOTE — Progress Notes (Signed)
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  Pt admitted with cellulitis of lt lower extremities.   Spoke with pt at bedside, who reports feeling poorly today. She shares she has not appetite secondary to pain. Pt consumed toast and some juice for breakfast and lunch tray was untouched. Pt shares "I just want to sleep until this pain goes away; I can't even think about eating".   PTA, pt shares that she had a fair appetite. 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 Pt denies any changes in mobility up until the past few days secondary to pain in her legs.   Pt endorses wt loss, but is unsure of UBW. Per pt, her weight usually ranges between 120-135#.   Discussed importance of good meal and supplement intake to promote healing. Pt amenable to Ensure supplements, states she consumes them occasionally at home.    Medications reviewed and include vitamin C and 0.9% sodium  chloride infusion @ 75 ml/hr.   Lab Results  Component Value Date   HGBA1C 6.8 (H) 04/04/2022   PTA DM medications are none.   Labs reviewed: Mg: 1.5, CBGS: 138-187 (inpatient orders for glycemic control are 0-5 units insulin aspart daily at bedtime and 0-9 units insulin aspart TID with meals).    NUTRITION - FOCUSED PHYSICAL EXAM:  Flowsheet Row Most Recent Value  Orbital Region Severe depletion  Upper Arm Region Severe depletion  Thoracic and Lumbar Region Severe depletion  Buccal Region Severe depletion  Temple Region Severe depletion  Clavicle Bone Region Severe depletion  Clavicle and Acromion Bone Region Severe depletion  Scapular Bone Region Severe depletion  Dorsal Hand Severe depletion  Patellar Region Severe depletion  Anterior Thigh Region Severe depletion  Posterior Calf Region Severe depletion  Edema (RD Assessment) Mild  Hair Reviewed  Eyes Reviewed  Mouth Reviewed  Skin Reviewed  Nails Reviewed       Diet Order:   Diet Order             Diet Heart Room service appropriate? Yes; Fluid consistency: Thin  Diet effective now                   EDUCATION NEEDS:   Education needs have been addressed  Skin:  Skin Assessment: Reviewed RN Assessment  Last BM:  04/29/22 (type 6)  Height:   Ht Readings from Last 1 Encounters:  04/29/22 5\' 8"  (1.727 m)    Weight:   Wt Readings from Last 1 Encounters:  04/29/22 59 kg    Ideal Body Weight:  63.6 kg  BMI:  Body mass index is 19.77 kg/m.  Estimated Nutritional Needs:   Kcal:  2050-2250  Protein:  105-120 grams  Fluid:  > 2 L    Levada Schilling, RD, LDN, CDCES Registered Dietitian II Certified Diabetes Care and Education Specialist Please refer to [facility] for RD and/or RD on-call/weekend/after hours pager

## 2022-04-30 DIAGNOSIS — L03116 Cellulitis of left lower limb: Secondary | ICD-10-CM

## 2022-04-30 DIAGNOSIS — E43 Unspecified severe protein-calorie malnutrition: Secondary | ICD-10-CM | POA: Diagnosis present

## 2022-04-30 DIAGNOSIS — L988 Other specified disorders of the skin and subcutaneous tissue: Secondary | ICD-10-CM

## 2022-04-30 DIAGNOSIS — L989 Disorder of the skin and subcutaneous tissue, unspecified: Secondary | ICD-10-CM

## 2022-04-30 DIAGNOSIS — L03115 Cellulitis of right lower limb: Secondary | ICD-10-CM | POA: Diagnosis not present

## 2022-04-30 LAB — COMPREHENSIVE METABOLIC PANEL
ALT: 23 U/L (ref 0–44)
AST: 28 U/L (ref 15–41)
Albumin: <1.5 g/dL — ABNORMAL LOW (ref 3.5–5.0)
Alkaline Phosphatase: 296 U/L — ABNORMAL HIGH (ref 38–126)
Anion gap: 6 (ref 5–15)
BUN: 11 mg/dL (ref 6–20)
CO2: 25 mmol/L (ref 22–32)
Calcium: 7.4 mg/dL — ABNORMAL LOW (ref 8.9–10.3)
Chloride: 108 mmol/L (ref 98–111)
Creatinine, Ser: 0.77 mg/dL (ref 0.44–1.00)
GFR, Estimated: >60 mL/min (ref >60)
Glucose, Bld: 83 mg/dL (ref 70–99)
Potassium: 2.6 mmol/L — CL (ref 3.5–5.1)
Sodium: 139 mmol/L (ref 135–145)
Total Bilirubin: 0.7 mg/dL (ref 0.3–1.2)
Total Protein: 4.9 g/dL — ABNORMAL LOW (ref 6.5–8.1)

## 2022-04-30 LAB — CBC
HCT: 25.9 % — ABNORMAL LOW (ref 36.0–46.0)
Hemoglobin: 8.6 g/dL — ABNORMAL LOW (ref 12.0–15.0)
MCH: 27.1 pg (ref 26.0–34.0)
MCHC: 33.2 g/dL (ref 30.0–36.0)
MCV: 81.7 fL (ref 80.0–100.0)
Platelets: 256 10*3/uL (ref 150–400)
RBC: 3.17 MIL/uL — ABNORMAL LOW (ref 3.87–5.11)
RDW: 17.2 % — ABNORMAL HIGH (ref 11.5–15.5)
WBC: 9.4 10*3/uL (ref 4.0–10.5)
nRBC: 0 % (ref 0.0–0.2)

## 2022-04-30 LAB — PROCALCITONIN: Procalcitonin: 0.84 ng/mL

## 2022-04-30 LAB — GLUCOSE, CAPILLARY
Glucose-Capillary: 98 mg/dL (ref 70–99)
Glucose-Capillary: 105 mg/dL — ABNORMAL HIGH (ref 70–99)
Glucose-Capillary: 85 mg/dL (ref 70–99)
Glucose-Capillary: 91 mg/dL (ref 70–99)

## 2022-04-30 LAB — MAGNESIUM: Magnesium: 2 mg/dL (ref 1.7–2.4)

## 2022-04-30 LAB — LACTIC ACID, PLASMA: Lactic Acid, Venous: 1.4 mmol/L (ref 0.5–1.9)

## 2022-04-30 MED ORDER — LIDOCAINE HCL (PF) 2 % IJ SOLN
0.0000 mL | Freq: Once | INTRAMUSCULAR | Status: DC | PRN
  Filled 2022-04-30: qty 20

## 2022-04-30 MED ORDER — ONDANSETRON HCL 4 MG/2ML IJ SOLN
4.0000 mg | Freq: Three times a day (TID) | INTRAMUSCULAR | Status: DC | PRN
  Administered 2022-04-30: 4 mg via INTRAVENOUS
  Filled 2022-04-30 (×2): qty 2

## 2022-04-30 MED ORDER — POTASSIUM CHLORIDE CRYS ER 20 MEQ PO TBCR
40.0000 meq | EXTENDED_RELEASE_TABLET | Freq: Once | ORAL | Status: AC
  Administered 2022-04-30: 40 meq via ORAL
  Filled 2022-04-30: qty 2

## 2022-04-30 MED ORDER — ALBUMIN HUMAN 25 % IV SOLN
25.0000 g | Freq: Every day | INTRAVENOUS | Status: DC
Start: 2022-04-30 — End: 2022-05-01
  Administered 2022-04-30 – 2022-05-01 (×2): 25 g via INTRAVENOUS
  Filled 2022-04-30 (×2): qty 100

## 2022-04-30 MED ORDER — PROCHLORPERAZINE EDISYLATE 10 MG/2ML IJ SOLN
5.0000 mg | Freq: Once | INTRAMUSCULAR | Status: AC
  Administered 2022-04-30: 5 mg via INTRAVENOUS
  Filled 2022-04-30: qty 1

## 2022-04-30 MED ORDER — FUROSEMIDE 20 MG PO TABS
20.0000 mg | ORAL_TABLET | Freq: Every day | ORAL | Status: DC
  Administered 2022-04-30 – 2022-05-01 (×2): 20 mg via ORAL
  Filled 2022-04-30 (×2): qty 1

## 2022-04-30 MED ORDER — SPIRONOLACTONE 25 MG PO TABS
25.0000 mg | ORAL_TABLET | Freq: Every day | ORAL | Status: DC
  Administered 2022-04-30 – 2022-05-01 (×2): 25 mg via ORAL
  Filled 2022-04-30 (×2): qty 1

## 2022-04-30 MED ORDER — POTASSIUM CHLORIDE 10 MEQ/100ML IV SOLN
10.0000 meq | INTRAVENOUS | Status: AC
  Administered 2022-04-30 (×6): 10 meq via INTRAVENOUS
  Filled 2022-04-30 (×6): qty 100

## 2022-04-30 MED ORDER — ROSUVASTATIN CALCIUM 10 MG PO TABS
5.0000 mg | ORAL_TABLET | Freq: Every day | ORAL | Status: DC
  Administered 2022-04-30 – 2022-05-01 (×2): 5 mg via ORAL
  Filled 2022-04-30 (×2): qty 1

## 2022-04-30 NOTE — Progress Notes (Signed)
Cross Cover Refractory nausea and vomiting - zofran ineffective for relief Qtc 470 Compazine 5 mg ordered

## 2022-04-30 NOTE — Progress Notes (Signed)
Progress Note   Patient: Stephanie Levy WHQ:[id number] DOB: 10-04-1981 DOA: 04/29/2022     1 DOS: the patient was seen and examined on 04/30/2022   Brief hospital course: Taken from H&P.  Stephanie Levy is a 41 y.o. 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   Patient was recently hospitalized from 6/30 - 7/4 due to bilateral leg cellulitis.  Patient was treated antibiotics with improvement.  Patient states that in the past several days, she has worsening bilateral leg edema and pain. She also has erythema and skin tears in her legs. The pain is constant, severe, sharp, nonradiating. Denies fever or chills.  Patient has nausea and dry heaves, denies abdominal pain.  Patient states that she had diarrhea in the past several days which has resolved.  Today patient does not have diarrhea.  No symptoms of UTI.  She also reports chest pain which is located in the front chest, mild, sharp, pleuritic, nonradiating, aggravated by deep breath.  Patient denies cough or shortness of breath.   Data reviewed independently and ED Course: pt was found to have WBC 10.2, INR 1.3, troponin level 21, BNP 58.1, lipase 16, albumin<1.5, liver function (ALP 338, AST and ALT normal, total bilirubin 0.5), temperature normal, blood pressure 104/79, heart rate of 112, 109, RR 18, oxygen saturation 100% on room air.  Chest x-ray showed possible left lower lobe infiltration.  CTA negative for PE. Patient is admitted to telemetry bed as inpatient.   CT angiogram and CT of abdomen/pelvis: 1. No evidence of pulmonary embolism. 2. Small bilateral pleural effusions (left-greater-than-right) lying dependently with areas of passive atelectasis in the dependent portions of the lungs bilaterally. 3. Hepatic cirrhosis with  moderate volume of ascites. 4. There is also diffuse body wall edema. Overall, imaging findings suggest a state of anasarca. 5. Severe atrophy throughout the pancreas with multiple small low-attenuation lesions, largest of which is in the pancreatic tail measuring 1.1 x 1.5 cm. 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 6. Additional incidental findings, as above.     EKG:  Sinus rhythm, QTc 537, low voltage, nonspecific T wave change   7/28: Preliminary blood cultures remain negative.  CRP negative.  Significant hypokalemia with potassium of 2.6 and magnesium of 2.  Potassium is being repleted with IV and p.o. Significant hypoalbuminemia with anasarca.  Stopping IV fluid and giving some albumin. 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  Patient is not established with any hepatologist or dermatologist.  There was no dermatologist available today.  She needs skin biopsy for the definitive diagnosis of her lesions.  Message sent to general surgery. I will stop antibiotics and observe. Also consulted GI to see if they would like to see her while in the hospital or give her a outpatient appointment to get her established for liver cirrhosis. Starting her on low-dose Lasix and spironolactone for ascites and anasarca.   Assessment and Plan: * Skin lesions Patient does not meets criteria for sepsis. 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 CRP negative, minimally elevated ESR.  Blood cultures remain negative. Hep C antibodies checked in 2019 was  negative. -Recheck for hepatitis C -Consulted general surgery for skin biopsy. -Stop antibiotics  Alcohol abuse -in remission, encouraged to not restart drinking alcohol -pt is not taking naltrexone  HTN (hypertension) Initially home Lasix was held for concern of dehydration lactic acidosis which has been resolved with IV fluid now. -Restart low-dose Lasix -Add low-dose spironolactone at 25 mg daily -IV hydralazine as needed  Alcoholic cirrhosis of liver with ascites (HCC) Mental status normal.  INR 1.3.  Denies abdominal pain Lactulose was also added for elevated ammonia levels. Patient need to get established with hepatology. -Adding low-dose Lasix and spironolactone for ascites and anasarca. -Giving some albumin secondary to significant hypoalbuminemia  Depression with anxiety - Hold Pristiq due to QTc prolongation 537 -Continue home Klonopin  ADHD Pt is not taking Adderall now. Lithium levels were low, most likely noncompliance. -continue home Lithium   Hypokalemia Potassium at 2.9, magnesium has been normalized. -Repleat potassium and monitor  Diabetes mellitus without complication (HCC) Recent A1c 6.8.  Patient used to be on Lantus 10 units daily which was discontinued at discharge of previous admission since patient had low blood sugar level -Sliding scale insulin  Normocytic anemia Hemoglobin stable, 9.7 (8.3 on 04/03/2022. -Follow-up by CBC  Myocardial injury  Troponin level 21>>16. Patient reports pleuritic chest pain. CT angiogram is negative for PE.  Chest x-ray showed possible left lower lobe infiltration, but the patient denies shortness of breath or cough, no fever or leukocytosis, low suspicions of pneumonia. -Start aspirin 81 mg daily -As needed nitroglycerin -Lipid panel with elevated total and triglycerides, LDL at 158. -A1c of 6.3 -Start her on statin, T. bili, AST and ALT within normal limit, some elevation of alkaline phosphatase which will need  monitoring.  Pancreatic lesion CT scan showed severe atrophy throughout the pancreas with multiple small low-attenuation lesions, largest of which is in the pancreatic tail measuring 1.1 x 1.5 cm. 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 -need to f/u with PCP   Protein-calorie malnutrition, severe Estimated body mass index is 22.32 kg/m as calculated from the following:   Height as of this encounter: '5\' 8"'  (1.727 m).   Weight as of this encounter: 66.6 kg.  -Dietitian consult  Malnutrition of moderate degree-resolved as of 04/30/2022 BMI 19.77, body weight 59 kg.  Albumin <1.5 -Continue Ensure -Consult nutrition   Elevated lactic acid level Lactic acid 3.2>>2.9>>1.4.  Patient does not meets criteria for sepsis.  Likely due to dehydration -Encourage p.o. hydration and stop IV fluid now.  Hypomagnesemia Mg 1.5>>2.0 -Continue to monitor     Subjective: Patient continued to have bilateral leg weakness.  Physical Exam: Vitals:   04/30/22 0417 04/30/22 0430 04/30/22 0750 04/30/22 1133  BP:   121/89 111/78  Pulse: 97  96 100  Resp:      Temp:   98.5 F (36.9 C) 99.1 F (37.3 C)  TempSrc:      SpO2: 100%  100% 100%  Weight:  66.6 kg    Height:       General.  Malnourished lady, in no acute distress. Pulmonary.  Lungs clear bilaterally, normal respiratory effort. CV.  Regular rate and rhythm, no JVD, rub or murmur. Abdomen.  Soft, nontender, mildly distended with positive fluid wave, BS positive. CNS.  Alert and oriented .  No focal neurologic deficit. Extremities.  1+ LE edema , abdominal wall edema, multiple circumferential erythematous lesions involving both lower extremities. Psychiatry.  Judgment and insight appears  normal.         Data Reviewed: Prior notes, labs and images reviewed  Family Communication: Discussed with wife on phone  Disposition: Status is: Inpatient Remains inpatient appropriate because: Severity of illness   Planned Discharge Destination: Home  DVT prophylaxis.  Lovenox Time spent: 55 minutes  This record has been created using Systems analyst. Errors have been sought and corrected,but may not always be located. Such creation errors do not reflect on the standard of care.  Author: Lorella Nimrod, MD 04/30/2022 12:42 PM  For on call review [email or web address].
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
## 2022-04-30 NOTE — Assessment & Plan Note (Signed)
Estimated body mass index is 22.32 kg/m as calculated from the following:   Height as of this encounter: 5\' 8"  (1.727 m).   Weight as of this encounter: 66.6 kg.  -Dietitian consult

## 2022-04-30 NOTE — Progress Notes (Signed)
Surgical doctor came in and did skin biopsy on bilateral lower extremity. RN assisted the doctor, dressing change was done after. Biopsy specimen was sent to lab.

## 2022-04-30 NOTE — Assessment & Plan Note (Addendum)
Potassium at 2.9, magnesium has been normalized. -Repleat potassium and monitor

## 2022-04-30 NOTE — Hospital Course (Addendum)
Taken from H&P.  Stephanie Levy is a 41 y.o. 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   Patient was recently hospitalized from 6/30 - 7/4 due to bilateral leg cellulitis.  Patient was treated antibiotics with improvement.  Patient states that in the past several days, she has worsening bilateral leg edema and pain. She also has erythema and skin tears in her legs. The pain is constant, severe, sharp, nonradiating. Denies fever or chills.  Patient has nausea and dry heaves, denies abdominal pain.  Patient states that she had diarrhea in the past several days which has resolved.  Today patient does not have diarrhea.  No symptoms of UTI.  She also reports chest pain which is located in the front chest, mild, sharp, pleuritic, nonradiating, aggravated by deep breath.  Patient denies cough or shortness of breath.   Data reviewed independently and ED Course: pt was found to have WBC 10.2, INR 1.3, troponin level 21, BNP 58.1, lipase 16, albumin<1.5, liver function (ALP 338, AST and ALT normal, total bilirubin 0.5), temperature normal, blood pressure 104/79, heart rate of 112, 109, RR 18, oxygen saturation 100% on room air.  Chest x-ray showed possible left lower lobe infiltration.  CTA negative for PE. Patient is admitted to telemetry bed as inpatient.   CT angiogram and CT of abdomen/pelvis: 1. No evidence of pulmonary embolism. 2. Small bilateral pleural effusions (left-greater-than-right) lying dependently with areas of passive atelectasis in the dependent portions of the lungs bilaterally. 3. Hepatic cirrhosis with moderate volume of ascites. 4. There is also diffuse body wall edema. Overall, imaging findings suggest a state of anasarca. 5. Severe atrophy throughout the pancreas with  multiple small low-attenuation lesions, largest of which is in the pancreatic tail measuring 1.1 x 1.5 cm. 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 6. Additional incidental findings, as above.     EKG:  Sinus rhythm, QTc 537, low voltage, nonspecific T wave change   7/28: Preliminary blood cultures remain negative.  CRP negative.  Significant hypokalemia with potassium of 2.6 and magnesium of 2.  Potassium is being repleted with IV and p.o. Significant hypoalbuminemia with anasarca.  Stopping IV fluid and giving some albumin. 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  Patient is not established with any hepatologist or dermatologist.  There was no dermatologist available today.  She needs skin biopsy for the definitive diagnosis of her lesions.  Message sent to general surgery. I will stop antibiotics and observe. Also consulted GI to see if they would like to see her while in the hospital or give her a outpatient appointment to get her established for liver cirrhosis. Starting her on low-dose Lasix and spironolactone for ascites and anasarca.  7/29: Patient remained stable.  Skin biopsy was done by general surgery yesterday.  They need to be followed up by primary care provider and they should provide appropriate referral.  Patient need to see a dermatologist as soon as possible, PCP should be able to help.  GI also saw her and recommended starting her on Creon 72,000 units with meals and 36 units with snacks which  were ordered. Patient was also advised to increase her protein intake and supplement her diet with Ensure or boost to help with her malnutrition and  hypoalbuminemia. She will follow-up with gastroenterology as an outpatient for further recommendations.  Patient will continue on current management and follow-up with her providers.

## 2022-05-01 DIAGNOSIS — E43 Unspecified severe protein-calorie malnutrition: Secondary | ICD-10-CM

## 2022-05-01 DIAGNOSIS — K7031 Alcoholic cirrhosis of liver with ascites: Secondary | ICD-10-CM | POA: Diagnosis not present

## 2022-05-01 DIAGNOSIS — L989 Disorder of the skin and subcutaneous tissue, unspecified: Secondary | ICD-10-CM | POA: Diagnosis not present

## 2022-05-01 LAB — COMPREHENSIVE METABOLIC PANEL
ALT: 21 U/L (ref 0–44)
AST: 24 U/L (ref 15–41)
Albumin: 1.6 g/dL — ABNORMAL LOW (ref 3.5–5.0)
Alkaline Phosphatase: 328 U/L — ABNORMAL HIGH (ref 38–126)
Anion gap: 6 (ref 5–15)
BUN: 8 mg/dL (ref 6–20)
CO2: 23 mmol/L (ref 22–32)
Calcium: 7.6 mg/dL — ABNORMAL LOW (ref 8.9–10.3)
Chloride: 108 mmol/L (ref 98–111)
Creatinine, Ser: 0.8 mg/dL (ref 0.44–1.00)
GFR, Estimated: >60 mL/min (ref >60)
Glucose, Bld: 99 mg/dL (ref 70–99)
Potassium: 3.9 mmol/L (ref 3.5–5.1)
Sodium: 137 mmol/L (ref 135–145)
Total Bilirubin: 1.2 mg/dL (ref 0.3–1.2)
Total Protein: 4.9 g/dL — ABNORMAL LOW (ref 6.5–8.1)

## 2022-05-01 LAB — GLUCOSE, CAPILLARY
Glucose-Capillary: 99 mg/dL (ref 70–99)
Glucose-Capillary: 132 mg/dL — ABNORMAL HIGH (ref 70–99)

## 2022-05-01 LAB — PROCALCITONIN: Procalcitonin: 0.77 ng/mL

## 2022-05-01 MED ORDER — PANCRELIPASE (LIP-PROT-AMYL) 36000-114000 UNITS PO CPEP: ORAL_CAPSULE | ORAL | 11 refills | Status: AC

## 2022-05-01 MED ORDER — SPIRONOLACTONE 25 MG PO TABS: 25.0000 mg | ORAL_TABLET | Freq: Every day | ORAL | 2 refills | Status: AC

## 2022-05-01 MED ORDER — TRAMADOL HCL 50 MG PO TABS
50.0000 mg | ORAL_TABLET | Freq: Two times a day (BID) | ORAL | 0 refills | Status: AC | PRN
Start: 2022-05-01 — End: ?

## 2022-05-01 MED ORDER — ROSUVASTATIN CALCIUM 5 MG PO TABS: 5.0000 mg | ORAL_TABLET | Freq: Every day | ORAL | 1 refills | Status: AC

## 2022-05-01 MED ORDER — LACTULOSE 10 GM/15ML PO SOLN: 20.0000 g | Freq: Two times a day (BID) | ORAL | 0 refills | Status: DC

## 2022-05-01 MED ORDER — ASPIRIN 81 MG PO TBEC: 81.0000 mg | DELAYED_RELEASE_TABLET | Freq: Every day | ORAL | 12 refills | Status: AC

## 2022-05-01 NOTE — Discharge Summary (Signed)
Physician Discharge Summary   Patient: Stephanie Levy MRN: [id number] DOB: 06-10-1981  Admit date:     04/29/2022  Discharge date: 05/01/22  Discharge Physician: Lorella Nimrod   PCP: Konrad Saha, MD   Recommendations at discharge:  Please follow-up on skin biopsy results Patient need to see a dermatologist-PCP should be able to arrange. Follow-up with primary care provider Follow-up with gastroenterology  Discharge Diagnoses: Principal Problem:   Skin lesions Active Problems:   Alcohol abuse   HTN (hypertension)   Alcoholic cirrhosis of liver with ascites (HCC)   Depression with anxiety   ADHD   Hypokalemia   Diabetes mellitus without complication (HCC)   Normocytic anemia   Myocardial injury   Pancreatic lesion   Protein-calorie malnutrition, severe   Elevated lactic acid level   Hypomagnesemia   Hospital Course: Taken from [facility].  Stephanie Levy is a 41 y.o. 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   Patient was recently hospitalized from 6/30 - 7/4 due to bilateral leg cellulitis.  Patient was treated antibiotics with improvement.  Patient states that in the past several days, she has worsening bilateral leg edema and pain. She also has erythema and skin tears in her legs. The pain is constant, severe, sharp, nonradiating. Denies fever or chills.  Patient has nausea and dry heaves, denies abdominal pain.  Patient states that she had diarrhea in the past several days which has resolved.  Today patient does not have diarrhea.  No symptoms of UTI.  She also reports chest pain which is located in the front chest, mild, sharp, pleuritic, nonradiating, aggravated by deep breath.  Patient denies cough or shortness of breath.   Data reviewed independently and ED  Course: pt was found to have WBC 10.2, INR 1.3, troponin level 21, BNP 58.1, lipase 16, albumin<1.5, liver function (ALP 338, AST and ALT normal, total bilirubin 0.5), temperature normal, blood pressure 104/79, heart rate of 112, 109, RR 18, oxygen saturation 100% on room air.  Chest x-ray showed possible left lower lobe infiltration.  CTA negative for PE. Patient is admitted to telemetry bed as inpatient.   CT angiogram and CT of abdomen/pelvis: 1. No evidence of pulmonary embolism. 2. Small bilateral pleural effusions (left-greater-than-right) lying dependently with areas of passive atelectasis in the dependent portions of the lungs bilaterally. 3. Hepatic cirrhosis with moderate volume of ascites. 4. There is also diffuse body wall edema. Overall, imaging findings suggest a state of anasarca. 5. Severe atrophy throughout the pancreas with multiple small low-attenuation lesions, largest of which is in the pancreatic tail measuring 1.1 x 1.5 cm. 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 6. Additional incidental findings, as above.     EKG:  Sinus rhythm, QTc 537, low voltage, nonspecific T wave change   7/28: Preliminary blood cultures remain negative.  CRP negative.  Significant hypokalemia with potassium of 2.6 and magnesium of 2.  Potassium is being repleted with IV and p.o. Significant hypoalbuminemia with anasarca.  Stopping IV fluid and giving some albumin. 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  Patient is not established with any  hepatologist or dermatologist.  There was no dermatologist available today.  She needs skin biopsy for the definitive diagnosis of  her lesions.  Message sent to general surgery. I will stop antibiotics and observe. Also consulted GI to see if they would like to see her while in the hospital or give her a outpatient appointment to get her established for liver cirrhosis. Starting her on low-dose Lasix and spironolactone for ascites and anasarca.  7/29: Patient remained stable.  Skin biopsy was done by general surgery yesterday.  They need to be followed up by primary care provider and they should provide appropriate referral.  Patient need to see a dermatologist as soon as possible, PCP should be able to help.  GI also saw her and recommended starting her on Creon 72,000 units with meals and 36 units with snacks which were ordered. Patient was also advised to increase her protein intake and supplement her diet with Ensure or boost to help with her malnutrition and hypoalbuminemia. She will follow-up with gastroenterology as an outpatient for further recommendations.  Patient will continue on current management and follow-up with her providers.  Assessment and Plan: * Skin lesions Patient does not meets criteria for sepsis. 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 CRP negative, minimally elevated ESR.  Blood cultures remain negative. Hep C antibodies checked in 2019 was negative. -Recheck for hepatitis C -Consulted general surgery for skin biopsy. -Stop antibiotics  Alcohol abuse -in remission, encouraged to not restart drinking alcohol -pt is not taking naltrexone  HTN (hypertension) Initially home Lasix was held for concern of dehydration lactic acidosis which has been resolved with IV fluid now. -Restart low-dose Lasix -Add low-dose spironolactone at 25 mg daily -IV hydralazine as needed  Alcoholic cirrhosis of liver with ascites (HCC) Mental status normal.  INR 1.3.  Denies abdominal  pain Lactulose was also added for elevated ammonia levels. Patient need to get established with hepatology. -Adding low-dose Lasix and spironolactone for ascites and anasarca. -Giving some albumin secondary to significant hypoalbuminemia  Depression with anxiety - Hold Pristiq due to QTc prolongation 537 -Continue home Klonopin  ADHD Pt is not taking Adderall now. Lithium levels were low, most likely noncompliance. -continue home Lithium   Hypokalemia Potassium at 2.9, magnesium has been normalized. -Repleat potassium and monitor  Diabetes mellitus without complication (HCC) Recent A1c 6.8.  Patient used to be on Lantus 10 units daily which was discontinued at discharge of previous admission since patient had low blood sugar level -Sliding scale insulin  Normocytic anemia Hemoglobin stable, 9.7 (8.3 on 04/03/2022. -Follow-up by CBC  Myocardial injury  Troponin level 21>>16. Patient reports pleuritic chest pain. CT angiogram is negative for PE.  Chest x-ray showed possible left lower lobe infiltration, but the patient denies shortness of breath or cough, no fever or leukocytosis, low suspicions of pneumonia. -Start aspirin 81 mg daily -As needed nitroglycerin -Lipid panel with elevated total and triglycerides, LDL at 158. -A1c of 6.3 -Start her on statin, T. bili, AST and ALT within normal limit, some elevation of alkaline phosphatase which will need monitoring.  Pancreatic lesion CT scan showed severe atrophy throughout the pancreas with multiple small low-attenuation lesions, largest of which is in the pancreatic tail measuring 1.1 x 1.5 cm. These are nonspecific, and may simply represent small pancreatic pseudocysts or side branch IPMNs (intraductal papillary mucinous neoplasms) - follow-up abdominal MRI with and without IV  gadolinium with MRCP is recommended in 6 months to ensure the stability of this finding. -need to f/u with PCP   Protein-calorie malnutrition,  severe Estimated body mass index is 22.32 kg/m as calculated from the following:   Height as of this encounter: '5\' 8"'  (1.727 m).   Weight as of this encounter: 66.6 kg.  -Dietitian consult  Malnutrition of moderate degree-resolved as of 04/30/2022 BMI 19.77, body weight 59 kg.  Albumin <1.5 -Continue Ensure -Consult nutrition   Elevated lactic acid level Lactic acid 3.2>>2.9>>1.4.  Patient does not meets criteria for sepsis.  Likely due to dehydration -Encourage p.o. hydration and stop IV fluid now.  Hypomagnesemia Mg 1.5>>2.0 -Continue to monitor        Pain control - [facility] Controlled Substance Reporting System database was reviewed. and patient was instructed, not to drive, operate heavy machinery, perform activities at heights, swimming or participation in water activities or provide baby-sitting services while on Pain, Sleep and Anxiety Medications; until their outpatient Physician has advised to do so again. Also recommended to not to take more than prescribed Pain, Sleep and Anxiety Medications.  Consultants: Gastroenterology, general surgery Procedures performed: Skin biopsy Disposition: Home health Diet recommendation:  Discharge Diet Orders (From admission, onward)     Start     Ordered   05/01/22 0000  Diet - low sodium heart healthy        05/01/22 1219           Cardiac and Carb modified diet DISCHARGE MEDICATION: Allergies as of 05/01/2022       Reactions   Peanut-containing Drug Products Anaphylaxis   Amoxicillin Other (See Comments)   Gi distress   Citrus Rash        Medication List     STOP taking these medications    cephALEXin 500 MG capsule Commonly known as: KEFLEX   methocarbamol 500 MG tablet Commonly known as: Robaxin       TAKE these medications    acetaminophen 500 MG tablet Commonly known as: TYLENOL Take 2 tablets by mouth 4 (four) times daily as needed.   albuterol 108 (90 Base) MCG/ACT inhaler Commonly  known as: VENTOLIN HFA Inhale 1 puff into the lungs daily as needed.   amphetamine-dextroamphetamine 20 MG tablet Commonly known as: ADDERALL Take 20 mg by mouth 3 (three) times daily.   ascorbic acid 500 MG tablet Commonly known as: VITAMIN C Take 1 tablet (500 mg total) by mouth 2 (two) times daily.   aspirin EC 81 MG tablet Take 1 tablet (81 mg total) by mouth daily. Swallow whole.   blood glucose meter kit and supplies Kit Dispense based on patient and insurance preference. Use up to four times daily as directed. (FOR ICD-9 250.00, 250.01).   clonazePAM 0.5 MG disintegrating tablet Commonly known as: KLONOPIN Take 0.5-1 tablets by mouth 2 (two) times daily. 1MG-AM, 0.5MG-PM   desvenlafaxine 50 MG 24 hr tablet Commonly known as: PRISTIQ Take 50 mg by mouth 2 (two) times daily. What changed: Another medication with the same name was removed. Continue taking this medication, and follow the directions you see here.   feeding supplement Liqd Take 237 mLs by mouth 3 (three) times daily between meals.   furosemide 20 MG tablet Commonly known as: LASIX Take 1 tablet (20 mg total) by mouth daily.   gabapentin 300 MG capsule Commonly known as: NEURONTIN Take 300 mg by mouth 2 (two) times daily. Take one capsule twice a day for peripheral neuropathy  lactulose 10 GM/15ML solution Commonly known as: CHRONULAC Take 30 mLs (20 g total) by mouth 2 (two) times daily.   lipase/protease/amylase 36000 UNITS Cpep capsule Commonly known as: Creon Take 2 capsules (72,000 Units total) by mouth 3 (three) times daily with meals. May also take 1 capsule (36,000 Units total) as needed (with snacks).   lithium carbonate 150 MG capsule Take 150 mg by mouth daily.   multivitamin with minerals Tabs tablet Take 1 tablet by mouth daily.   naltrexone 50 MG tablet Commonly known as: DEPADE Take 1 tablet (50 mg total) by mouth daily.   potassium chloride SA 20 MEQ tablet Commonly known as:  KLOR-CON M Take 1 tablet (20 mEq total) by mouth daily.   rosuvastatin 5 MG tablet Commonly known as: CRESTOR Take 1 tablet (5 mg total) by mouth daily.   spironolactone 25 MG tablet Commonly known as: ALDACTONE Take 1 tablet (25 mg total) by mouth daily.   traMADol 50 MG tablet Commonly known as: ULTRAM Take 1 tablet (50 mg total) by mouth every 12 (twelve) hours as needed for moderate pain.   traZODone 50 MG tablet Commonly known as: DESYREL Take 50 mg by mouth at bedtime.   Vitamin D (Ergocalciferol) 1.25 MG (50000 UNIT) Caps capsule Commonly known as: DRISDOL Take 50,000 Units by mouth once a week.               Discharge Care Instructions  (From admission, onward)           Start     Ordered   05/01/22 0000  Discharge wound care:       Comments: Cleanse bilateral LE wounds with saline, pat dry.  Cover open wounds of the bilateral LEs with single layer of xeroform gauze, top with kerlix or foam dressings. Change every other day.   05/01/22 1219            Discharge Exam: Filed Weights   04/29/22 0445 04/30/22 0430 05/01/22 0442  Weight: 59 kg 66.6 kg 69 kg   General.  Malnourished lady, in no acute distress. Pulmonary.  Lungs clear bilaterally, normal respiratory effort. CV.  Regular rate and rhythm, no JVD, rub or murmur. Abdomen.  Soft, nontender, nondistended, BS positive. CNS.  Alert and oriented .  No focal neurologic deficit. Extremities.  No edema, no cyanosis, pulses intact and symmetrical.  Bilateral lower extremities with some erythematous patches and clean bandages. Psychiatry.  Judgment and insight appears normal.   Condition at discharge: stable  The results of significant diagnostics from this hospitalization (including imaging, microbiology, ancillary and laboratory) are listed below for reference.   Imaging Studies: US Venous Img Lower Bilateral  Result Date: 04/29/2022 CLINICAL DATA:  Swelling, erythema, and pain in the lower  extremities. EXAM: BILATERAL LOWER EXTREMITY VENOUS DOPPLER ULTRASOUND TECHNIQUE: Gray-scale sonography with compression, as well as color and duplex ultrasound, were performed to evaluate the deep venous system(s) from the level of the common femoral vein through the popliteal and proximal calf veins. COMPARISON:  04/02/2022 FINDINGS: VENOUS Normal compressibility of the common femoral, superficial femoral, and popliteal veins, as well as the visualized calf veins. Visualized portions of profunda femoral vein and great saphenous vein unremarkable. No filling defects to suggest DVT on grayscale or color Doppler imaging. Doppler waveforms show normal direction of venous flow, normal respiratory plasticity and response to augmentation. Limited views of the contralateral common femoral vein are unremarkable. OTHER None. Limitations: none IMPRESSION: No findings of lower extremity DVT on  either side. Electronically Signed   By: Van Clines M.D.   On: 04/29/2022 08:45   CT Angio Chest PE W and/or Wo Contrast  Result Date: 04/29/2022 CLINICAL DATA:  41 year old female with history of chest pain, abdominal pain and emesis. Shortness of breath. Bilateral leg swelling. EXAM: CT ANGIOGRAPHY CHEST CT ABDOMEN AND PELVIS WITH CONTRAST TECHNIQUE: Multidetector CT imaging of the chest was performed using the standard protocol during bolus administration of intravenous contrast. Multiplanar CT image reconstructions and MIPs were obtained to evaluate the vascular anatomy. Multidetector CT imaging of the abdomen and pelvis was performed using the standard protocol during bolus administration of intravenous contrast. RADIATION DOSE REDUCTION: This exam was performed according to the departmental dose-optimization program which includes automated exposure control, adjustment of the mA and/or kV according to patient size and/or use of iterative reconstruction technique. CONTRAST:  164m OMNIPAQUE IOHEXOL 350 MG/ML SOLN  COMPARISON:  CTA of the abdomen and pelvis 04/02/2022. FINDINGS: CTA CHEST FINDINGS Cardiovascular: No filling defects within the pulmonary arterial tree to suggest pulmonary embolism. Heart size is normal. There is no significant pericardial fluid, thickening or pericardial calcification. No atherosclerotic calcifications are noted in the thoracic aorta or the coronary arteries. Mediastinum/Nodes: No pathologically enlarged mediastinal or hilar lymph nodes. Esophagus is unremarkable in appearance. No axillary lymphadenopathy. Lungs/Pleura: Small bilateral pleural effusions (left-greater-than-right) lying dependently. Some passive atelectasis is noted in the dependent portions of the lower lobes of the lungs bilaterally (left-greater-than-right). No acute consolidative airspace disease. No definite suspicious appearing pulmonary nodules or masses are noted. Musculoskeletal: There are no aggressive appearing lytic or blastic lesions noted in the visualized portions of the skeleton. Review of the MIP images confirms the above findings. CT ABDOMEN and PELVIS FINDINGS Hepatobiliary: Liver has a slightly shrunken appearance and nodular contour, indicative of underlying cirrhosis. No discrete cystic or solid hepatic lesions. No intra or extrahepatic biliary ductal dilatation. Status post cholecystectomy. Pancreas: Diffuse atrophy throughout the pancreas. Low-attenuation lesions in the pancreas, most notably in the pancreatic tail where the largest of these lesions measures up to 1.1 x 1.5 cm (axial image 22 of series 2). No peripancreatic fluid collections or surrounding inflammatory changes. Spleen: Unremarkable. Adrenals/Urinary Tract: Bilateral kidneys and bilateral adrenal glands are normal in appearance. No hydroureteronephrosis. Urinary bladder is unremarkable in appearance. 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 No pathologic dilatation of small bowel or colon. Appendix is not  confidently identified, potentially surgically absent. Vascular/Lymphatic: Atherosclerotic disease noted in the pelvic vasculature. No aneurysm or dissection noted in the abdominal or pelvic vasculature. Portal vein is patent measuring 13 mm in the porta hepatis. No lymphadenopathy noted in the abdomen or pelvis. Reproductive: Uterus and ovaries are unremarkable in appearance. Other: Moderate volume of ascites. Mild diffuse mesenteric edema. No pneumoperitoneum. Diffuse body wall edema. Musculoskeletal: Status post PLIF at L4-L5 with interbody graft at L4-L5 interspace. There are no aggressive appearing lytic or blastic lesions noted in the visualized portions of the skeleton. Review of the MIP images confirms the above findings. IMPRESSION: 1. No evidence of pulmonary embolism. 2. Small bilateral pleural effusions (left-greater-than-right) lying dependently with areas of passive atelectasis in the dependent portions of the lungs bilaterally. 3. Hepatic cirrhosis with moderate volume of ascites. 4. There is also diffuse body wall edema. Overall, imaging findings suggest a state of anasarca. 5. Severe atrophy throughout the pancreas with multiple small low-attenuation lesions, largest of which is in the pancreatic tail measuring 1.1 x 1.5 cm. These are nonspecific, and  may simply represent small pancreatic pseudocysts or side branch IPMNs (intraductal papillary mucinous neoplasms), however, follow-up abdominal MRI with and without IV gadolinium with MRCP is recommended in 6 months to ensure the stability of this finding. 6. Additional incidental findings, as above. Electronically Signed   By: Vinnie Langton M.D.   On: 04/29/2022 06:46   CT ABDOMEN PELVIS W CONTRAST  Result Date: 04/29/2022 CLINICAL DATA:  41 year old female with history of chest pain, abdominal pain and emesis. Shortness of breath. Bilateral leg swelling. EXAM: CT ANGIOGRAPHY CHEST CT ABDOMEN AND PELVIS WITH CONTRAST TECHNIQUE: Multidetector CT  imaging of the chest was performed using the standard protocol during bolus administration of intravenous contrast. Multiplanar CT image reconstructions and MIPs were obtained to evaluate the vascular anatomy. Multidetector CT imaging of the abdomen and pelvis was performed using the standard protocol during bolus administration of intravenous contrast. RADIATION DOSE REDUCTION: This exam was performed according to the departmental dose-optimization program which includes automated exposure control, adjustment of the mA and/or kV according to patient size and/or use of iterative reconstruction technique. CONTRAST:  130m OMNIPAQUE IOHEXOL 350 MG/ML SOLN COMPARISON:  CTA of the abdomen and pelvis 04/02/2022. FINDINGS: CTA CHEST FINDINGS Cardiovascular: No filling defects within the pulmonary arterial tree to suggest pulmonary embolism. Heart size is normal. There is no significant pericardial fluid, thickening or pericardial calcification. No atherosclerotic calcifications are noted in the thoracic aorta or the coronary arteries. Mediastinum/Nodes: No pathologically enlarged mediastinal or hilar lymph nodes. Esophagus is unremarkable in appearance. No axillary lymphadenopathy. Lungs/Pleura: Small bilateral pleural effusions (left-greater-than-right) lying dependently. Some passive atelectasis is noted in the dependent portions of the lower lobes of the lungs bilaterally (left-greater-than-right). No acute consolidative airspace disease. No definite suspicious appearing pulmonary nodules or masses are noted. Musculoskeletal: There are no aggressive appearing lytic or blastic lesions noted in the visualized portions of the skeleton. Review of the MIP images confirms the above findings. CT ABDOMEN and PELVIS FINDINGS Hepatobiliary: Liver has a slightly shrunken appearance and nodular contour, indicative of underlying cirrhosis. No discrete cystic or solid hepatic lesions. No intra or extrahepatic biliary ductal  dilatation. Status post cholecystectomy. Pancreas: Diffuse atrophy throughout the pancreas. Low-attenuation lesions in the pancreas, most notably in the pancreatic tail where the largest of these lesions measures up to 1.1 x 1.5 cm (axial image 22 of series 2). No peripancreatic fluid collections or surrounding inflammatory changes. Spleen: Unremarkable. Adrenals/Urinary Tract: Bilateral kidneys and bilateral adrenal glands are normal in appearance. No hydroureteronephrosis. Urinary bladder is unremarkable in appearance. 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 No pathologic dilatation of small bowel or colon. Appendix is not confidently identified, potentially surgically absent. Vascular/Lymphatic: Atherosclerotic disease noted in the pelvic vasculature. No aneurysm or dissection noted in the abdominal or pelvic vasculature. Portal vein is patent measuring 13 mm in the porta hepatis. No lymphadenopathy noted in the abdomen or pelvis. Reproductive: Uterus and ovaries are unremarkable in appearance. Other: Moderate volume of ascites. Mild diffuse mesenteric edema. No pneumoperitoneum. Diffuse body wall edema. Musculoskeletal: Status post PLIF at L4-L5 with interbody graft at L4-L5 interspace. There are no aggressive appearing lytic or blastic lesions noted in the visualized portions of the skeleton. Review of the MIP images confirms the above findings. IMPRESSION: 1. No evidence of pulmonary embolism. 2. Small bilateral pleural effusions (left-greater-than-right) lying dependently with areas of passive atelectasis in the dependent portions of the lungs bilaterally. 3. Hepatic cirrhosis with moderate volume of ascites. 4. There is also diffuse body  wall edema. Overall, imaging findings suggest a state of anasarca. 5. Severe atrophy throughout the pancreas with multiple small low-attenuation lesions, largest of which is in the pancreatic tail measuring 1.1 x 1.5 cm. These are  nonspecific, and may simply represent small pancreatic pseudocysts or side branch IPMNs (intraductal papillary mucinous neoplasms), however, follow-up abdominal MRI with and without IV gadolinium with MRCP is recommended in 6 months to ensure the stability of this finding. 6. Additional incidental findings, as above. Electronically Signed   By: Vinnie Langton M.D.   On: 04/29/2022 06:46   DG Chest Portable 1 View  Result Date: 04/29/2022 CLINICAL DATA:  41 year old female with history of chest pain and shortness of breath. EXAM: PORTABLE CHEST 1 VIEW COMPARISON:  Chest x-ray 08/25/2019. FINDINGS: Patchy areas of interstitial prominence an ill-defined airspace disease noted throughout the left mid to lower lung. Right lung is clear. No pleural effusions. No pneumothorax. No evidence of pulmonary edema. Heart size is normal. Upper mediastinal contours are within normal limits. IMPRESSION: 1. Probable left lower lobe bronchopneumonia, as above. Followup PA and lateral chest X-ray is recommended in 3-4 weeks following trial of antibiotic therapy to ensure resolution and exclude underlying malignancy. Electronically Signed   By: Vinnie Langton M.D.   On: 04/29/2022 05:29   US Abdomen Limited RUQ (LIVER/GB)  Result Date: 04/03/2022 CLINICAL DATA:  Cirrhosis. EXAM: ULTRASOUND ABDOMEN LIMITED RIGHT UPPER QUADRANT COMPARISON:  CTA, 04/02/2022. FINDINGS: Gallbladder: Status post cholecystectomy. Common bile duct: Diameter: 6 mm Liver: Coarsened echotexture and increased parenchymal echogenicity. Normal size. Surface nodularity. No defined mass. Portal vein is patent on color Doppler imaging with normal direction of blood flow towards the liver. Other: Ascites. IMPRESSION: 1. Liver appearance consistent with a combination of hepatic steatosis and cirrhosis. No liver mass. 2. Ascites. Electronically Signed   By: Lajean Manes M.D.   On: 04/03/2022 16:32   CT Angio Aortobifemoral W and/or Wo Contrast  Result Date:  04/02/2022 CLINICAL DATA:  Bilateral lower extremity swelling for the past 2 days. EXAM: CT ANGIOGRAPHY OF ABDOMINAL AORTA WITH ILIOFEMORAL RUNOFF TECHNIQUE: Multidetector CT imaging of the abdomen, pelvis and lower extremities was performed using the standard protocol during bolus administration of intravenous contrast. Multiplanar CT image reconstructions and MIPs were obtained to evaluate the vascular anatomy. RADIATION DOSE REDUCTION: This exam was performed according to the departmental dose-optimization program which includes automated exposure control, adjustment of the mA and/or kV according to patient size and/or use of iterative reconstruction technique. CONTRAST:  147m OMNIPAQUE IOHEXOL 350 MG/ML SOLN COMPARISON:  None Available. FINDINGS: VASCULAR Aorta: There is no significant atherosclerotic plaque within the normal caliber abdominal aorta. No evidence of abdominal aortic dissection or perivascular stranding. Celiac: Widely patent without hemodynamically significant narrowing. The left gastric artery is incidentally noted to arise directly from the abdominal aorta and gives rise to a accessory left hepatic artery. Otherwise, conventional branching pattern. SMA: Widely patent without hemodynamically significant narrowing. Conventional branching pattern. The distal tributaries of the SMA appear widely patent without discrete intraluminal filling defect to suggest distal embolism. Renals: Solitary bilaterally; widely patent without hemodynamically significant narrowing. No vessel irregularity to suggest FMD. IMA: Widely patent without hemodynamically significant narrowing. RIGHT Lower Extremity Inflow: The right common, external and internal iliac arteries are of normal caliber and widely patent without a hemodynamically significant narrowing. Outflow: The right common, deep and superficial femoral arteries are of normal caliber and widely patent without hemodynamically significant narrowing. The right  above and below-knee popliteal arteries are  of normal caliber and widely patent without hemodynamically significant narrowing. Runoff: Three-vessel runoff to the right lower leg and foot. The right-sided dorsalis pedis artery is patent to the level of the forefoot. No discrete lumen filling defects to suggest distal embolism. LEFT Lower Extremity Inflow: The left common, external and internal iliac arteries are widely patent without hemodynamically significant narrowing. Outflow: The left common, deep and superficial femoral arteries are of normal caliber and widely patent without hemodynamically significant narrowing. The left above and below-knee popliteal arteries are of normal caliber and widely patent without hemodynamically significant narrowing. Runoff: The left below-knee popliteal artery scratch the three-vessel runoff to the left lower leg and foot. The left-sided dorsalis pedis artery is patent to the level of the forefoot. No discrete lumen filling defects to suggest distal embolism. Veins: The IVC and pelvic venous systems appear widely patent on this arterial phase examination. Review of the MIP images confirms the above findings. _________________________________________________________ NON-VASCULAR Evaluation of abdominal organs is limited to the arterial phase of enhancement. Lower chest: Limited visualization of the lower thorax is negative for focal airspace opacity or pleural effusion. Normal heart size.  No pericardial effusion. Hepatobiliary: Mild nodularity of the hepatic contour. No discrete hyperenhancing hepatic lesions. Trace amount of peri hepatic ascites. Post cholecystectomy. No intra or extrahepatic biliary ductal dilatation. Pancreas: Atrophic. Spleen: Normal appearance of the spleen. Adrenals/Urinary Tract: There is symmetric enhancement of the bilateral kidneys. No evidence of nephrolithiasis on this postcontrast examination. No discrete renal lesions. No urine obstruction or  perinephric stranding. Normal appearance the bilateral adrenal glands. There is mild diffuse thickening the urinary bladder wall, potentially accentuated due to underdistention. Stomach/Bowel: Sequela of previous gastric bypass surgery. Moderate to large colonic stool burden without evidence of enteric obstruction. Colonic wall thickening is nonspecific in the presence of intra-abdominal ascites. Normal appearance of the terminal ileum. The appendix is not visualized, however there is no pericecal inflammatory change. No pneumoperitoneum pneumatosis or portal venous gas. Lymphatic: No bulky retroperitoneal, mesenteric, pelvic or inguinal lymphadenopathy. Reproductive: Normal appearance of the pelvic organs. No discrete adnexal lesions. Other: Diffuse body wall anasarca, most conspicuous about the lower extremities bilaterally. Dystrophic calcification about the midline of the ventral abdomen may be postoperative in etiology. Serpiginous structure deep to the umbilicus may represent the sequela of previously recanalized periumbilical vein. Musculoskeletal: Post L4-L5 paraspinal fusion and intervertebral disc space replacement without evidence of hardware failure loosening. 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 IMPRESSION: VASCULAR 1. Normal CTA run-off. 2. Normal three-vessel runoff to the bilateral lower legs and feet. The dorsalis pedis arteries are patent bilaterally. No evidence of distal embolism. NON-VASCULAR 1. Mild nodularity of the hepatic contour with minimal amount of intra-abdominal ascites, nonspecific though suggestive of cirrhosis with early portal venous hypertension. Correlation with LFTs is advised. 2. Diffuse body wall anasarca most conspicuous about the lower extremities bilaterally, nonspecific though could be seen in the setting heart failure/third spacing. Clinical correlation is advised. 3. Mild diffuse colonic wall thickening, nonspecific in  the presence of intraabdominal ascites. 4. Sequela of previous gastric bypass surgery without evidence of enteric obstruction. Electronically Signed   By: Sandi Mariscal M.D.   On: 04/02/2022 15:47   US Venous Img Lower Bilateral  Result Date: 04/02/2022 CLINICAL DATA:  Bilateral lower extremity pain and edema. Evaluate for DVT. EXAM: BILATERAL LOWER EXTREMITY VENOUS DOPPLER ULTRASOUND TECHNIQUE: Gray-scale sonography with graded compression, as well as color Doppler and duplex ultrasound were performed to  evaluate the lower extremity deep venous systems from the level of the common femoral vein and including the common femoral, femoral, profunda femoral, popliteal and calf veins including the posterior tibial, peroneal and gastrocnemius veins when visible. The superficial great saphenous vein was also interrogated. Spectral Doppler was utilized to evaluate flow at rest and with distal augmentation maneuvers in the common femoral, femoral and popliteal veins. COMPARISON:  None Available. FINDINGS: RIGHT LOWER EXTREMITY Common Femoral Vein: No evidence of thrombus. Normal compressibility, respiratory phasicity and response to augmentation. Saphenofemoral Junction: No evidence of thrombus. Normal compressibility and flow on color Doppler imaging. Profunda Femoral Vein: No evidence of thrombus. Normal compressibility and flow on color Doppler imaging. Femoral Vein: No evidence of thrombus. Normal compressibility, respiratory phasicity and response to augmentation. Popliteal Vein: No evidence of thrombus. Normal compressibility, respiratory phasicity and response to augmentation. Calf Veins: Appear patent where visualized. Superficial Great Saphenous Vein: No evidence of thrombus. Normal compressibility. Other Findings: There is a moderate amount of subcutaneous edema at the level of the right lower leg and calf. LEFT LOWER EXTREMITY Common Femoral Vein: No evidence of thrombus. Normal compressibility, respiratory  phasicity and response to augmentation. Saphenofemoral Junction: No evidence of thrombus. Normal compressibility and flow on color Doppler imaging. Profunda Femoral Vein: No evidence of thrombus. Normal compressibility and flow on color Doppler imaging. Femoral Vein: No evidence of thrombus. Normal compressibility, respiratory phasicity and response to augmentation. Popliteal Vein: No evidence of thrombus. Normal compressibility, respiratory phasicity and response to augmentation. Calf Veins: Appear patent where visualized. Superficial Great Saphenous Vein: No evidence of thrombus. Normal compressibility. Other Findings: There is a moderate amount of subcutaneous edema at the level of the left lower leg and calf. IMPRESSION: No evidence of DVT within either lower extremity. Electronically Signed   By: Sandi Mariscal M.D.   On: 04/02/2022 14:19    Microbiology: Results for orders placed or performed during the hospital encounter of 04/29/22  Culture, blood (x 2)     Status: None (Preliminary result)   Collection Time: 04/29/22  8:28 PM   Specimen: BLOOD  Result Value Ref Range Status   Specimen Description BLOOD LEFT HAND  Final   Special Requests   Final    BOTTLES DRAWN AEROBIC AND ANAEROBIC Blood Culture adequate volume   Culture   Final    NO GROWTH 2 DAYS Performed at [facility], [address] [place], [place] [address]    Report Status PENDING  Incomplete  Culture, blood (x 2)     Status: None (Preliminary result)   Collection Time: 04/29/22  8:35 PM   Specimen: BLOOD  Result Value Ref Range Status   Specimen Description BLOOD LEFT FA  Final   Special Requests   Final    BOTTLES DRAWN AEROBIC AND ANAEROBIC Blood Culture adequate volume   Culture   Final    NO GROWTH 2 DAYS Performed at [facility], [address] [place], [place] [address]    Report Status PENDING  Incomplete    Labs: CBC: Recent Labs  Lab 04/29/22 0454 04/30/22 0623  WBC 10.2 9.4  HGB  9.7* 8.6*  HCT 29.7* 25.9*  MCV 83.0 81.7  PLT 304 144   Basic Metabolic Panel: Recent Labs  Lab 04/29/22 0454 04/29/22 1050 04/30/22 0623 05/01/22 0529  NA 136  --  139 137  K 4.1  --  2.6* 3.9  CL 105  --  108 108  CO2 23  --  25  23  GLUCOSE 204*  --  83 99  BUN 11  --  11 8  CREATININE 0.90  --  0.77 0.80  CALCIUM 7.4*  --  7.4* 7.6*  MG  --  1.5* 2.0  --    Liver Function Tests: Recent Labs  Lab 04/29/22 0454 04/30/22 0623 05/01/22 0529  AST 37 28 24  ALT '25 23 21  ' ALKPHOS 338* 296* 328*  BILITOT 1.0 0.7 1.2  PROT 5.3* 4.9* 4.9*  ALBUMIN <1.5* <1.5* 1.6*   CBG: Recent Labs  Lab 04/30/22 1135 04/30/22 1522 04/30/22 2039 05/01/22 0759 05/01/22 1200  GLUCAP 105* 85 91 99 132*    Discharge time spent: greater than 30 minutes.  This record has been created using Systems analyst. Errors have been sought and corrected,but may not always be located. Such creation errors do not reflect on the standard of care.   Signed: Lorella Nimrod, MD [facility] 05/01/2022
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
## 2022-05-01 NOTE — Progress Notes (Addendum)
Contacted NP Jon Billings due to patient having persistent nausea and vomiting. Zofran is not due to be given. Patient vomited 100 mls of yellow emesis with undigested food particles from her soup that she attempted to eat. Confirmed qtc interval with Kendal Hymen in [facility] and reported to [facility]. Compazine was ordered and given.    Update: Compazine relieved patient's symptoms

## 2022-05-01 NOTE — TOC Progression Note (Addendum)
Transition of Care [facility]) - Progression Note    Patient Details  Name: Tonji Elliff MRN: [id number] Date of Birth: Jul 22, 1981  Transition of Care [facility]) CM/SW Contact  Bing Quarry, RN Phone Number: 05/01/2022, 12:23 PM  Clinical Narrative:  7/29: TOC for pending HH/DME needs. No PT orders yet. Reached out to [employer] well. Pending responses. Gabriel Cirri RN CM    Update: [facility] and [facility] declined. VM left with [facility]). [facility] declines. [facility] declined. Request for [facility] and DME order placed. [facility] notified. Gabriel Cirri RN CM   [facility] declined. [facility] for [facility]. To be delivered to home address.  Unit RN did patient teaching on wound care with supplies for two dressing changes. Outpatient options given to patient and outpatient progress note placed in chart. PCP follow up is only wound care referral option on weekend discharge. PCP is Dr. Adaline Sill.  Patient is agreeable to follow up with PCP per unit RN. Provider updated. Gabriel Cirri RN CM          Expected Discharge Plan and Services           Expected Discharge Date: 05/01/22                                     Social Determinants of Health (SDOH) Interventions    Readmission Risk Interventions     No data to display

## 2022-05-01 NOTE — Progress Notes (Signed)
Pt has DC order after GI seen her. AVS was given and explained to pt, all questions has been answered. DME BSC will be delivered at home on Sun/Mon, pt is aware. Pt was agreeable to see her PCP for wound care follow-up, dressing change supplies were provided to pt (supplies will last for at least 2-3 dressing change), pt was taught how dressing change was done. Pt left via wheelchair, wife picked up pt.

## 2022-05-01 NOTE — Progress Notes (Cosign Needed)
Occupational Therapy * Physical Therapy * Speech Therapy          DATE ___________________ PATIENT NAME_____________________ PATIENT MRN____________________  DIAGNOSIS/DIAGNOSIS CODE ______________________ DATE OF DISCHARGE: ______________  PRIMARY CARE PHYSICIAN __________________________ PCP PHONE/FAX___________________________     Dear Provider (Name: __________________   Fax: ___________________________):   I certify that I have examined this patient and that occupational/physical/speech therapy is necessary on an outpatient basis.    The patient has expressed interest in completing their recommended course of therapy at your location.  Once a formal order from the patient's primary care physician has been obtained, please contact him/her to schedule an appointment for evaluation at your earliest convenience.   [  ]  Physical Therapy Evaluate and Treat          [  ]  Occupational Therapy Evaluate and Treat                                    [  ]  Speech Therapy Evaluate and Treat       The patient's primary care physician (listed above) must furnish and be responsible for a formal order such that the recommended services may be furnished while under the primary physician's care, and that the plan of care will be established and reviewed every 30 days (or more often if condition necessitates).  

## 2022-05-03 ENCOUNTER — Telehealth: Payer: Self-pay

## 2022-05-03 ENCOUNTER — Encounter: Payer: Self-pay | Admitting: Gastroenterology

## 2022-05-03 LAB — C-REACTIVE PROTEIN

## 2022-05-03 NOTE — Telephone Encounter (Signed)
Wrote letter and patient will come pick up letter

## 2022-05-03 NOTE — Telephone Encounter (Signed)
Patients spouse called and was asking if doctor Allegra Lai could give her a call back in reference to getting a sooner appointment. States she does not think the patient will still be here in September if she is not seen. Spouse states that the patient is getting worse day by day.

## 2022-05-03 NOTE — Telephone Encounter (Signed)
Moved patient to 05/11/2022

## 2022-05-03 NOTE — Telephone Encounter (Signed)
Made appointment 06/09/2022

## 2022-05-03 NOTE — Telephone Encounter (Signed)
They have sent a mychart message and will reply on mychart

## 2022-05-03 NOTE — Telephone Encounter (Signed)
Patient states the hospital wrote her a note to return to work today and she tried to go in and was unable to stay. She said it was to soon to returned to work. Patient asked if we could write her a note to return on Friday to work. I told her I did not know if we could write her a note but I would ask the provider

## 2022-05-03 NOTE — Telephone Encounter (Signed)
Called and left a message for call back  

## 2022-05-03 NOTE — Telephone Encounter (Signed)
-----   Message from Toney Reil, MD sent at 05/01/2022  6:40 PM EDT ----- Regarding: Hospital follow-up Morrie Sheldon  Please schedule appointment to see me in 4 to 6 weeks, okay to overbook Dx: Gastric bypass, severe protein calorie malnutrition, EPI  RV

## 2022-05-04 LAB — CULTURE, BLOOD (ROUTINE X 2)
Culture: NO GROWTH
Culture: NO GROWTH
Special Requests: ADEQUATE
Special Requests: ADEQUATE

## 2022-05-05 LAB — SURGICAL PATHOLOGY

## 2022-05-11 ENCOUNTER — Encounter: Payer: Self-pay | Admitting: Gastroenterology

## 2022-05-11 ENCOUNTER — Ambulatory Visit: Payer: BC Managed Care – PPO | Admitting: Gastroenterology

## 2022-05-11 VITALS — BP 112/79 | HR 91 | Temp 98.0°F | Ht 68.0 in | Wt 151.2 lb

## 2022-05-11 DIAGNOSIS — E43 Unspecified severe protein-calorie malnutrition: Secondary | ICD-10-CM | POA: Diagnosis not present

## 2022-05-11 DIAGNOSIS — K7030 Alcoholic cirrhosis of liver without ascites: Secondary | ICD-10-CM | POA: Diagnosis not present

## 2022-05-11 DIAGNOSIS — K8689 Other specified diseases of pancreas: Secondary | ICD-10-CM | POA: Diagnosis not present
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
## 2022-05-12 ENCOUNTER — Encounter: Payer: Self-pay | Admitting: Gastroenterology

## 2022-05-19 ENCOUNTER — Encounter: Payer: BC Managed Care – PPO | Attending: Internal Medicine | Admitting: Internal Medicine

## 2022-05-19 DIAGNOSIS — S81802A Unspecified open wound, left lower leg, initial encounter: Secondary | ICD-10-CM | POA: Diagnosis not present

## 2022-05-19 DIAGNOSIS — Z79899 Other long term (current) drug therapy: Secondary | ICD-10-CM | POA: Insufficient documentation

## 2022-05-19 DIAGNOSIS — R6510 Systemic inflammatory response syndrome (SIRS) of non-infectious origin without acute organ dysfunction: Secondary | ICD-10-CM | POA: Diagnosis not present

## 2022-05-19 DIAGNOSIS — S81801A Unspecified open wound, right lower leg, initial encounter: Secondary | ICD-10-CM | POA: Diagnosis not present

## 2022-05-19 DIAGNOSIS — E46 Unspecified protein-calorie malnutrition: Secondary | ICD-10-CM | POA: Diagnosis not present

## 2022-05-19 DIAGNOSIS — K7030 Alcoholic cirrhosis of liver without ascites: Secondary | ICD-10-CM | POA: Insufficient documentation

## 2022-05-19 DIAGNOSIS — E11622 Type 2 diabetes mellitus with other skin ulcer: Secondary | ICD-10-CM | POA: Insufficient documentation

## 2022-05-19 DIAGNOSIS — E43 Unspecified severe protein-calorie malnutrition: Secondary | ICD-10-CM | POA: Insufficient documentation

## 2022-05-19 DIAGNOSIS — Z6823 Body mass index (BMI) 23.0-23.9, adult: Secondary | ICD-10-CM | POA: Insufficient documentation

## 2022-05-19 DIAGNOSIS — K7031 Alcoholic cirrhosis of liver with ascites: Secondary | ICD-10-CM | POA: Diagnosis not present

## 2022-05-19 DIAGNOSIS — X58XXXA Exposure to other specified factors, initial encounter: Secondary | ICD-10-CM | POA: Insufficient documentation

## 2022-05-19 DIAGNOSIS — Z9884 Bariatric surgery status: Secondary | ICD-10-CM | POA: Insufficient documentation

## 2022-05-19 DIAGNOSIS — E11621 Type 2 diabetes mellitus with foot ulcer: Secondary | ICD-10-CM | POA: Insufficient documentation

## 2022-05-19 DIAGNOSIS — K8689 Other specified diseases of pancreas: Secondary | ICD-10-CM | POA: Insufficient documentation

## 2022-05-21 NOTE — Progress Notes (Addendum)
KAYTIE, RATCLIFFE ([id number]) Visit Report for 05/19/2022 Chief Complaint Document Details Patient Name: Stephanie Levy, Stephanie Levy Date of Service: 05/19/2022 10:00 AM Medical Record Number: [id number] Patient Account Number: [id number] Date of Birth/Sex: Mar 04, 1981 (41 y.o. F) Treating RN: Cornell Barman Primary Care Provider: Billie Lade Other Clinician: Referring Provider: Billie Lade Treating Provider/Extender: Yaakov Guthrie in Treatment: 0 Information Obtained from: Patient Chief Complaint 05/19/2022; increased swelling and weeping to the lower extremities bilaterally with open wounds secondary to Hypoalbuminemia from severe protein malnutrition In the setting of alcoholic cirrhosis. Electronic Signature(s) Signed: 05/19/2022 1:41:56 PM By: Kalman Shan DO Entered By: Kalman Shan on 05/19/2022 12:15:08 Stephanie Levy, Stephanie Levy ([id number]) -------------------------------------------------------------------------------- Debridement Details Patient Name: Stephanie Levy Date of Service: 05/19/2022 10:00 AM Medical Record Number: [id number] Patient Account Number: [id number] Date of Birth/Sex: 1980-11-06 (41 y.o. F) Treating RN: Cornell Barman Primary Care Provider: Billie Lade Other Clinician: Referring Provider: Billie Lade Treating Provider/Extender: Yaakov Guthrie in Treatment: 0 Debridement Performed for Wound #1 Left,Proximal,Medial Lower Leg Assessment: Performed By: Physician Kalman Shan, MD Debridement Type: Debridement Level of Consciousness (Pre- Awake and Alert procedure): Pre-procedure Verification/Time Out Yes - 11:47 Taken: Total Area Debrided (L x W): 1 (cm) x 1.1 (cm) = 1.1 (cm) Tissue and other material Viable, Non-Viable, Slough, Subcutaneous, Slough debrided: Level: Skin/Subcutaneous Tissue Debridement Description: Excisional Instrument: Curette Bleeding: Minimum Hemostasis Achieved: Pressure Response to Treatment: Procedure was tolerated  well Level of Consciousness (Post- Awake and Alert procedure): Post Debridement Measurements of Total Wound Length: (cm) 1 Width: (cm) 1.1 Depth: (cm) 0.1 Volume: (cm) 0.086 Character of Wound/Ulcer Post Debridement: Stable Post Procedure Diagnosis Same as Pre-procedure Electronic Signature(s) Signed: 05/19/2022 1:41:56 PM By: Kalman Shan DO Signed: 05/20/2022 7:54:04 AM By: Gretta Cool, BSN, RN, CWS, Kim RN, BSN Entered By: Gretta Cool, BSN, RN, CWS, Kim on 05/19/2022 11:48:21 Stephanie Levy, Stephanie Levy ([id number]) -------------------------------------------------------------------------------- Debridement Details Patient Name: Stephanie Levy Date of Service: 05/19/2022 10:00 AM Medical Record Number: [id number] Patient Account Number: [id number] Date of Birth/Sex: [birth year] (41 y.o. F) Treating RN: Cornell Barman Primary Care Provider: Billie Lade Other Clinician: Referring Provider: Billie Lade Treating Provider/Extender: Yaakov Guthrie in Treatment: 0 Debridement Performed for Wound #2 Left,Distal,Medial Lower Leg Assessment: Performed By: Physician Kalman Shan, MD Debridement Type: Debridement Level of Consciousness (Pre- Awake and Alert procedure): Pre-procedure Verification/Time Out Yes - 11:47 Taken: Total Area Debrided (L x W): 1 (cm) x 2.2 (cm) = 2.2 (cm) Tissue and other material Viable, Non-Viable, Slough, Subcutaneous, Slough debrided: Level: Skin/Subcutaneous Tissue Debridement Description: Excisional Instrument: Curette Bleeding: Minimum Hemostasis Achieved: Pressure Response to Treatment: Procedure was tolerated well Level of Consciousness (Post- Awake and Alert procedure): Post Debridement Measurements of Total Wound Length: (cm) 1 Width: (cm) 2.2 Depth: (cm) 0.1 Volume: (cm) 0.173 Character of Wound/Ulcer Post Debridement: Stable Post Procedure Diagnosis Same as Pre-procedure Electronic Signature(s) Signed: 05/19/2022 1:41:56 PM By: Kalman Shan DO Signed: 05/20/2022 7:54:04 AM By: Gretta Cool, BSN, RN, CWS, Kim RN, BSN Entered By: Gretta Cool, BSN, RN, CWS, Kim on 05/19/2022 11:48:52 Stephanie Levy, Stephanie Levy ([id number]) -------------------------------------------------------------------------------- Debridement Details Patient Name: Stephanie Levy Date of Service: 05/19/2022 10:00 AM Medical Record Number: [id number] Patient Account Number: [id number] Date of Birth/Sex: May 11, 1981 (41 y.o. F) Treating RN: Cornell Barman Primary Care Provider: Billie Lade Other Clinician: Referring Provider: Billie Lade Treating Provider/Extender: Yaakov Guthrie in Treatment: 0 Debridement Performed for Wound #3 Left,Midline Upper Leg Assessment: Performed By: Physician Kalman Shan, MD Debridement Type: Debridement Level of Consciousness (Pre- Awake and Alert procedure): Pre-procedure Verification/Time Out Yes - 11:47 Taken: Total  Area Debrided (L x W): 5 (cm) x 3 (cm) = 15 (cm) Tissue and other material Viable, Non-Viable, Slough, Subcutaneous, Slough debrided: Level: Skin/Subcutaneous Tissue Debridement Description: Excisional Instrument: Curette Bleeding: Minimum Hemostasis Achieved: Pressure Response to Treatment: Procedure was tolerated well Level of Consciousness (Post- Awake and Alert procedure): Post Debridement Measurements of Total Wound Length: (cm) 5 Width: (cm) 3 Depth: (cm) 0.1 Volume: (cm) 1.178 Character of Wound/Ulcer Post Debridement: Stable Post Procedure Diagnosis Same as Pre-procedure Electronic Signature(s) Signed: 05/19/2022 1:41:56 PM By: Kalman Shan DO Signed: 05/20/2022 7:54:04 AM By: Gretta Cool, BSN, RN, CWS, Kim RN, BSN Entered By: Gretta Cool, BSN, RN, CWS, Kim on 05/19/2022 11:49:20 Stephanie Levy, Stephanie Levy ([id number]) -------------------------------------------------------------------------------- HPI Details Patient Name: Stephanie Levy Date of Service: 05/19/2022 10:00 AM Medical Record Number:  [id number] Patient Account Number: [id number] Date of Birth/Sex: 07/24/1981 (41 y.o. 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 She has had worsening swelling of her legs bilaterally over the past several weeks. She subsequently developed weeping and open wounds. Patient was hospitalized on 04/29/2022 for skin lesions vs Lower extremity cellulitis. Biopsy was consistent with venous stasis. No evidence of vasculitis or pyoderma gangrenosum was seen on pathology. She saw her GI doctor who increased her Lasix to 40 mg daily and spironolactone to 25 milligrams daily. She is currently been keeping the wounds covered. She denies signs of infection. Electronic Signature(s) Signed: 05/19/2022 1:41:56 PM By: Kalman Shan DO Entered By: Kalman Shan on 05/19/2022 12:17:21 Stephanie Levy ([id number]) -------------------------------------------------------------------------------- Physical Exam Details Patient Name: Stephanie Levy Date of Service: 05/19/2022 10:00 AM Medical Record Number: [id number] Patient Account Number: [id number] Date of Birth/Sex: 07/12/1981 (41 y.o. F) Treating RN: Cornell Barman Primary Care Provider: Billie Lade Other Clinician: Referring Provider: Billie Lade Treating Provider/Extender: Yaakov Guthrie in Treatment: 0 Constitutional . Cardiovascular . Psychiatric . Notes Bilateral lower extremity wounds with weeping. On the left side there are multiple open wounds with nonviable tissue present. No surrounding signs of infection. 3+  pitting edema to the thigh. Electronic Signature(s) Signed: 05/19/2022 1:41:56 PM By: Kalman Shan DO Entered By: Kalman Shan on 05/19/2022 12:18:24 Stephanie Levy ([id number]) -------------------------------------------------------------------------------- Physician Orders Details Patient Name: Stephanie Levy Date of Service: 05/19/2022 10:00 AM Medical Record Number: [id number] Patient Account Number: [id number] Date of Birth/Sex: [birth year] (41 y.o. F) Treating RN: Cornell Barman Primary Care Provider: Billie Lade Other Clinician: Referring Provider: Billie Lade Treating Provider/Extender: Yaakov Guthrie in Treatment: 0 Verbal / Phone Orders: No Diagnosis Coding ICD-10 Coding Code Description V42.59 Alcoholic cirrhosis of liver without ascites E46 Unspecified protein-calorie malnutrition E11.621 Type 2 diabetes mellitus with foot ulcer E11.622 Type 2 diabetes mellitus with other skin ulcer S81.801A Unspecified open wound, right lower leg, initial encounter S81.802A Unspecified open wound, left lower leg, initial encounter Follow-up Appointments o Return Appointment in 1 week. o Nurse Visit as needed [place] o ADMIT to [place] for wound care. May utilize formulary equivalent dressing for wound treatment orders unless otherwise specified. Home Health Nurse may visit [facility] to address patientos wound care needs. o [place] for wound care. May utilize formulary equivalent dressing for wound treatment  orders unless otherwise specified. Home Health Nurse may visit [facility] to address patientos wound care needs. o Scheduled days for dressing changes to be completed; exception, patient has scheduled wound care visit that day. o **Please direct any NON-WOUND related issues/requests for orders to patient's Primary Care Physician. **If current dressing causes regression in wound condition, may D/C ordered  dressing product/s and apply Normal Saline Moist Dressing daily until next [place] or Other MD appointment. **Notify [facility] of regression in wound condition at [phone]. Bathing/ Shower/ Hygiene o May shower; gently cleanse wound with antibacterial soap, rinse and pat dry prior to dressing wounds Anesthetic (Use 'Patient Medications' Section for Anesthetic Order Entry) o Lidocaine applied to wound bed Edema Control - Lymphedema / Segmental Compressive Device / Other o Elevate, Exercise Daily and Avoid Standing for Long Periods of Time. o Elevate legs to the level of the heart and pump ankles as often as possible o Elevate leg(s) parallel to the floor when sitting. Non-Wound Condition Bilateral Lower Extremities o Additional non-wound orders/instructions: - lotion Wound Treatment Wound #1 - Lower Leg Wound Laterality: Left, Medial, Proximal Cleanser: Soap and Water (Home Health) 3 x Per Week/30 Days Discharge Instructions: Gently cleanse wound with antibacterial soap, rinse and pat dry prior to dressing wounds Topical: Santyl Collagenase Ointment, 30 (gm), tube (Home Health) 3 x Per Week/30 Days Discharge Instructions: apply nickel thick to wound bed only Primary Dressing: Hydrofera Blue Ready Transfer Foam, 2.5x2.5 (in/in) (Home Health) (Generic) 3 x Per Week/30 Days Discharge Instructions: Apply Hydrofera Blue Ready to wound bed as directed Secondary Dressing: Zetuvit Plus 4x4 (in/in) (Home Health) 3 x Per Week/30 Days Secured With: [place] Cohesive Bandage 4x5 (yds) Stretched (Home Health) 3 x Per Week/30 Days Urieta, Callahan ([id number]) Discharge Instructions: Apply [place] as directed. Secured With: [employer] or Non-Sterile 6-ply 4.5x4 (yd/yd) (Home Health) 3 x Per Week/30 Days Discharge Instructions: Apply Kerlix as directed Wound #2 - Lower Leg Wound Laterality: Left, Medial, Distal Cleanser: [place] Ancillary Kit - 15 Day Supply  (Generic) 3 x Per Week/30 Days Discharge Instructions: Use supplies as instructed; Kit contains: (15) Saline Bullets; (15) 3x3 Gauze; 15 pr Gloves Cleanser: Soap and Water 3 x Per Week/30 Days Discharge Instructions: Gently cleanse wound with antibacterial soap, rinse and pat dry prior to dressing wounds Topical: Santyl Collagenase Ointment, 30 (gm), tube (Home Health) 3 x Per Week/30 Days Discharge Instructions: apply nickel thick to wound bed only Primary Dressing: Hydrofera Blue Ready Transfer Foam, 2.5x2.5 (in/in) (Home Health) (Generic) 3 x Per Week/30 Days Discharge Instructions: Apply Hydrofera Blue Ready to wound bed as directed Secondary Dressing: Zetuvit Plus 4x4 (in/in) (Home Health) 3 x Per Week/30 Days Secured With: [place] Cohesive Bandage 4x5 (yds) Stretched (Home Health) 3 x Per Week/30 Days Discharge Instructions: Apply Coban as directed. Secured With: [employer] or Non-Sterile 6-ply 4.5x4 (yd/yd) (Home Health) 3 x Per Week/30 Days Discharge Instructions: Apply Kerlix as directed Wound #3 - Upper Leg Wound Laterality: Left, Midline Cleanser: Byram Ancillary Kit - 15 Day Supply (Generic) 3 x Per Week/30 Days Discharge Instructions: Use supplies as instructed; Kit contains: (15) Saline Bullets; (15) 3x3 Gauze; 15 pr Gloves Cleanser: Soap and Water 3 x Per Week/30 Days Discharge Instructions: Gently cleanse wound with antibacterial soap, rinse and pat dry prior to dressing wounds Topical: Santyl Collagenase Ointment, 30 (gm), tube (Home Health) 3 x Per Week/30 Days Discharge Instructions: apply nickel thick to wound bed only Primary Dressing: Hydrofera Blue Ready  Transfer Foam, 2.5x2.5 (in/in) (Home Health) (Generic) 3 x Per Week/30 Days Discharge Instructions: Apply Hydrofera Blue Ready to wound bed as directed Secondary Dressing: Zetuvit Plus 4x4 (in/in) (Home Health) 3 x Per Week/30 Days Secured With: [place]an Cohesive Bandage 4x5 (yds) Stretched (Home Health) 3 x Per  Week/30 Days Discharge Instructions: Apply Coban as directed. Secured With: [employer] or Non-Sterile 6-ply 4.5x4 (yd/yd) (Home Health) 3 x Per Week/30 Days Discharge Instructions: Apply Kerlix as directed Wound #4 - Toe Great Wound Laterality: Right, Distal Topical: bacitracin ([place]) 1 x Per Day/30 Days Secondary Dressing: Coverlet Latex-Free Fabric Adhesive Dressings (Home Health) 1 x Per Day/30 Days Discharge Instructions: Knuckle Wound #5 - Toe Great Wound Laterality: Left, Distal Topical: bacitracin ([place]) 1 x Per Day/30 Days Secondary Dressing: Coverlet Latex-Free Fabric Adhesive Dressings (Home Health) 1 x Per Day/30 Days Discharge Instructions: Knuckle Electronic Signature(s) Signed: 05/19/2022 1:41:56 PM By: Kalman Shan DO Entered By: Kalman Shan on 05/19/2022 12:20:22 Royer, Vickey ([id number]) Lavina Hamman, Karishma ([id number]) -------------------------------------------------------------------------------- Problem List Details Patient Name: Stephanie Levy Date of Service: 05/19/2022 10:00 AM Medical Record Number: [id number] Patient Account Number: [id number] Date of Birth/Sex: [birth year] (41 y.o. F) Treating RN: Cornell Barman Primary Care Provider: Billie Lade Other Clinician: Referring Provider: Billie Lade Treating Provider/Extender: Yaakov Guthrie in Treatment: 0 Active Problems ICD-10 Encounter Code Description Active Date MDM Diagnosis S17.79 Alcoholic cirrhosis of liver without ascites 05/19/2022 No Yes E46 Unspecified protein-calorie malnutrition 05/19/2022 No Yes S81.801A Unspecified open wound, right lower leg, initial encounter 05/19/2022 No Yes S81.802A Unspecified open wound, left lower leg, initial encounter 05/19/2022 No Yes E11.622 Type 2 diabetes mellitus with other skin ulcer 05/19/2022 No Yes E11.621 Type 2 diabetes mellitus with foot ulcer 05/19/2022 No Yes Inactive Problems Resolved Problems Electronic  Signature(s) Signed: 05/19/2022 1:41:56 PM By: Kalman Shan DO Entered By: Kalman Shan on 05/19/2022 12:14:44 Stephanie Levy, Stephanie Levy ([id number]) -------------------------------------------------------------------------------- Progress Note Details Patient Name: Stephanie Levy Date of Service: 05/19/2022 10:00 AM Medical Record Number: [id number] Patient Account Number: [id number] Date of Birth/Sex: 02-06-1981 (41 y.o. F) Treating RN: Cornell Barman Primary Care Provider: Billie Lade Other Clinician: Referring Provider: Billie Lade Treating Provider/Extender: Yaakov Guthrie in Treatment: 0 Subjective Chief Complaint Information obtained from Patient 05/19/2022; increased swelling and weeping to the lower extremities bilaterally with open wounds secondary to Hypoalbuminemia from severe protein malnutrition In the setting of alcoholic cirrhosis. History of Present Illness (HPI) Admission 05/19/2022 Ms. 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 She has had worsening swelling of her legs bilaterally over the past several weeks. She subsequently developed weeping and open wounds. Patient was hospitalized on 04/29/2022 for skin lesions vs Lower extremity cellulitis. Biopsy was consistent with venous stasis. No evidence of vasculitis or pyoderma gangrenosum was seen on pathology. She saw her GI doctor who increased her Lasix to 40 mg daily and spironolactone to 25 milligrams daily. She is currently been keeping the wounds covered. She denies signs of infection. Patient History Information obtained from Patient. Allergies amoxicillin Social History Never smoker, Marital Status - Married, Alcohol Use - Never, Drug Use - No History, Caffeine Use - Daily. Medical History Hematologic/Lymphatic Patient has history of  Anemia Cardiovascular Patient has history of Hypertension Gastrointestinal Patient has history of Cirrhosis Endocrine Patient has history of Type II Diabetes Patient is treated with Oral Agents. Blood sugar is not  tested. Medical And Surgical History Notes Musculoskeletal Spinal Fusion titanium rod in back Review of Systems (ROS) Constitutional Symptoms (General Health) Complains or has symptoms of Fatigue, Chills. Eyes Denies complaints or symptoms of Dry Eyes, Vision Changes, Glasses / Contacts. Ear/Nose/Mouth/Throat Denies complaints or symptoms of Difficult clearing ears, Sinusitis. Hematologic/Lymphatic Denies complaints or symptoms of Bleeding / Clotting Disorders, Human Immunodeficiency Virus. Respiratory Denies complaints or symptoms of Chronic or frequent coughs, Shortness of Breath. Cardiovascular Complains or has symptoms of LE edema - some times. Gastrointestinal Complains or has symptoms of Frequent diarrhea. Genitourinary Denies complaints or symptoms of Kidney failure/ Dialysis, Incontinence/dribbling. Immunological Denies complaints or symptoms of Hives, Itching. Integumentary (Skin) Complains or has symptoms of Wounds, Bleeding or bruising tendency. Musculoskeletal Complains or has symptoms of Muscle Weakness. Neurologic Stephanie Levy, Stephanie Levy ([id number]) Denies complaints or symptoms of Numbness/parasthesias, Focal/Weakness. Psychiatric Denies complaints or symptoms of Anxiety, Claustrophobia. Objective Constitutional Vitals Time Taken: 10:45 AM, Height: 68 in, Weight: 153 lbs, BMI: 23.3, Temperature: 97.7 F, Pulse: 106 bpm, Respiratory Rate: 16 breaths/min, Blood Pressure: 128/88 mmHg. General Notes: Bilateral lower extremity wounds with weeping. On the left side there are multiple open wounds with nonviable tissue present. No surrounding signs of infection. 3+ pitting edema to the thigh. Integumentary (Hair, Skin) Wound #1 status is Open. Original cause of  wound was Gradually Appeared. The date acquired was: 05/07/2022. The wound is located on the Left,Proximal,Medial Lower Leg. The wound measures 1cm length x 1.1cm width x 0.1cm depth; 0.864cm^2 area and 0.086cm^3 volume. There is no tunneling or undermining noted. There is a large amount of serous drainage noted. The wound margin is flat and intact. There is no granulation within the wound bed. There is a large (67-100%) amount of necrotic tissue within the wound bed including Adherent Slough. Wound #2 status is Open. Original cause of wound was Gradually Appeared. The date acquired was: 05/07/2022. The wound is located on the Left,Distal,Medial Lower Leg. The wound measures 1cm length x 2.2cm width x 0.1cm depth; 1.728cm^2 area and 0.173cm^3 volume. There is no tunneling or undermining noted. There is a large amount of serous drainage noted. The wound margin is flat and intact. There is no granulation within the wound bed. There is a large (67-100%) amount of necrotic tissue within the wound bed including Adherent Slough. Wound #3 status is Open. Original cause of wound was Gradually Appeared. The date acquired was: 05/07/2022. The wound is located on the Left,Midline Upper Leg. The wound measures 5cm length x 3cm width x 0.1cm depth; 11.781cm^2 area and 1.178cm^3 volume. There is no tunneling or undermining noted. There is a large amount of serous drainage noted. There is no granulation within the wound bed. There is a large (67-100%) amount of necrotic tissue within the wound bed including Adherent Slough. Wound #4 status is Open. Original cause of wound was Gradually Appeared. The date acquired was: 05/07/2022. The wound is located on the Right,Distal [employer] The wound measures 2cm length x 2.5cm width x 0.1cm depth; 3.927cm^2 area and 0.393cm^3 volume. There is no tunneling or undermining noted. There is a large amount of serous drainage noted. There is no granulation within the wound bed. There is  no necrotic tissue within the wound bed. Wound #5 status is Open. Original cause of wound was Gradually Appeared. The date acquired was: 05/07/2022. The wound is located on the [employer]. The wound measures 2cm length x 2.8cm width x 0.1cm depth; 4.398cm^2 area and 0.44cm^3 volume. There is no tunneling  or undermining noted. There is a large amount of serous drainage noted. There is large (67-100%) red granulation within the wound bed. There is no necrotic tissue within the wound bed. Assessment Active Problems PPJ-09 Alcoholic cirrhosis of liver without ascites Unspecified protein-calorie malnutrition Unspecified open wound, right lower leg, initial encounter Unspecified open wound, left lower leg, initial encounter Type 2 diabetes mellitus with other skin ulcer Type 2 diabetes mellitus with foot ulcer Patient has had increased swelling to her legs bilaterally over the past few weeks. She has weeping and open wounds due to this. Her diffuse anasarca is Secondary to her Hypoalbuminemia From severe protein malnutrition. Her albumin is 1.6 and total protein 4.9 She is on Creon and high-protein diet and protein supplements were recommended. Her GI doctor recently increased her Lasix and spironolactone. In order for her weeping and wounds to heal She will need to increase her protein intake and remove fluid through a diuretic. For now we will help control the symptoms with bilateral leg wraps and absorbent dressings. We will use Hydrofera Blue under the wraps and to the upper left leg wound. To the toe wounds she can use bacitracin ointment. She will come in for a nurse visit to change the wraps. I will see her back in 1 week. Stephanie Levy, Stephanie Levy ([id number]) Procedures Wound #1 Pre-procedure diagnosis of Wound #1 is an Atypical located on the Left,Proximal,Medial Lower Leg . There was a Excisional Skin/Subcutaneous Tissue Debridement with a total area of 1.1 sq cm performed by Kalman Shan, MD. With the following instrument(s): Curette to remove Viable and Non-Viable tissue/material. Material removed includes Subcutaneous Tissue and Slough and. No specimens were taken. A time out was conducted at 11:47, prior to the start of the procedure. A Minimum amount of bleeding was controlled with Pressure. The procedure was tolerated well. Post Debridement Measurements: 1cm length x 1.1cm width x 0.1cm depth; 0.086cm^3 volume. Character of Wound/Ulcer Post Debridement is stable. Post procedure Diagnosis Wound #1: Same as Pre-Procedure Wound #2 Pre-procedure diagnosis of Wound #2 is an Atypical located on the Left,Distal,Medial Lower Leg . There was a Excisional Skin/Subcutaneous Tissue Debridement with a total area of 2.2 sq cm performed by Kalman Shan, MD. With the following instrument(s): Curette to remove Viable and Non-Viable tissue/material. Material removed includes Subcutaneous Tissue and Slough and. No specimens were taken. A time out was conducted at 11:47, prior to the start of the procedure. A Minimum amount of bleeding was controlled with Pressure. The procedure was tolerated well. Post Debridement Measurements: 1cm length x 2.2cm width x 0.1cm depth; 0.173cm^3 volume. Character of Wound/Ulcer Post Debridement is stable. 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 There was a Excisional Skin/Subcutaneous Tissue Debridement with a total area of 15 sq cm performed by Kalman Shan, MD. With the following instrument(s): Curette to remove Viable and Non-Viable tissue/material. Material removed includes Subcutaneous Tissue and Slough and. No specimens were taken. A time out was conducted at 11:47, prior to the start of the procedure. A Minimum amount of bleeding was controlled with Pressure. The procedure was tolerated well. Post Debridement Measurements:  5cm length x 3cm width x 0.1cm depth; 1.178cm^3 volume. Character of Wound/Ulcer Post Debridement is stable. Post procedure Diagnosis Wound #3: Same as Pre-Procedure Plan Follow-up Appointments: Return Appointment in 1 week. Nurse Visit as needed Home Health: [place] ADMIT to [facility]  for wound care. May utilize formulary equivalent dressing for wound treatment orders unless otherwise specified. Home Health Nurse may visit [facility] to address patient s wound care needs. [facility] for wound care. May utilize formulary equivalent dressing for wound treatment orders unless otherwise specified. Home Health Nurse may visit [facility] to address patient s wound care needs. Scheduled days for dressing changes to be completed; exception, patient has scheduled wound care visit that day. **Please direct any NON-WOUND related issues/requests for orders to patient's Primary Care Physician. **If current dressing causes regression in wound condition, may D/C ordered dressing product/s and apply Normal Saline Moist Dressing daily until next [place] or Other MD appointment. **Notify [facility] of regression in wound condition at [phone]. Bathing/ Shower/ Hygiene: May shower; gently cleanse wound with antibacterial soap, rinse and pat dry prior to dressing wounds Anesthetic (Use 'Patient Medications' Section for Anesthetic Order Entry): Lidocaine applied to wound bed Edema Control - Lymphedema / Segmental Compressive Device / Other: Elevate, Exercise Daily and Avoid Standing for Long Periods of Time. Elevate legs to the level of the heart and pump ankles as often as possible Elevate leg(s) parallel to the floor when sitting. Non-Wound Condition: Additional non-wound orders/instructions: - lotion WOUND #1: - Lower Leg Wound Laterality: Left, Medial, Proximal Cleanser: Soap and Water (Home Health) 3 x Per Week/30 Days Discharge Instructions:  Gently cleanse wound with antibacterial soap, rinse and pat dry prior to dressing wounds Topical: Santyl Collagenase Ointment, 30 (gm), tube (Home Health) 3 x Per Week/30 Days Discharge Instructions: apply nickel thick to wound bed only Primary Dressing: Hydrofera Blue Ready Transfer Foam, 2.5x2.5 (in/in) (Home Health) (Generic) 3 x Per Week/30 Days Discharge Instructions: Apply Hydrofera Blue Ready to wound bed as directed Secondary Dressing: Zetuvit Plus 4x4 (in/in) (Home Health) 3 x Per Week/30 Days Secured With: Coban Cohesive Bandage 4x5 (yds) Stretched (Home Health) 3 x Per Week/30 Days Discharge Instructions: Apply Coban as directed. Secured With: [employer] or Non-Sterile 6-ply 4.5x4 (yd/yd) (Home Health) 3 x Per Week/30 Days Discharge Instructions: Apply Kerlix as directed WOUND #2: - Lower Leg Wound Laterality: Left, Medial, Distal Cleanser: [place] Ancillary Kit - 15 Day Supply (Generic) 3 x Per Week/30 Days Discharge Instructions: Use supplies as instructed; Kit contains: (15) Saline Bullets; (15) 3x3 Gauze; 15 pr Gloves Cleanser: Soap and Water 3 x Per Week/30 Days Stephanie Levy, Stephanie Levy ([id number]) Discharge Instructions: Gently cleanse wound with antibacterial soap, rinse and pat dry prior to dressing wounds Topical: Santyl Collagenase Ointment, 30 (gm), tube (Home Health) 3 x Per Week/30 Days Discharge Instructions: apply nickel thick to wound bed only Primary Dressing: Hydrofera Blue Ready Transfer Foam, 2.5x2.5 (in/in) (Home Health) (Generic) 3 x Per Week/30 Days Discharge Instructions: Apply Hydrofera Blue Ready to wound bed as directed Secondary Dressing: Zetuvit Plus 4x4 (in/in) (Home Health) 3 x Per Week/30 Days Secured With: Coban Cohesive Bandage 4x5 (yds) Stretched (Home Health) 3 x Per Week/30 Days Discharge Instructions: Apply Coban as directed. Secured With: [employer] or Non-Sterile 6-ply 4.5x4 (yd/yd) (Home Health) 3 x Per Week/30 Days Discharge  Instructions: Apply Kerlix as directed WOUND #3: - Upper Leg Wound Laterality: Left, Midline Cleanser: [employer]ram Ancillary Kit - 15 Day Supply (Generic) 3 x Per Week/30 Days Discharge Instructions: Use supplies as instructed; Kit contains: (15) Saline Bullets; (15) 3x3 Gauze; 15 pr Gloves Cleanser: Soap and Water 3 x Per Week/30 Days Discharge Instructions: Gently cleanse wound with antibacterial soap, rinse and pat dry prior to  dressing wounds Topical: Santyl Collagenase Ointment, 30 (gm), tube (Home Health) 3 x Per Week/30 Days Discharge Instructions: apply nickel thick to wound bed only Primary Dressing: Hydrofera Blue Ready Transfer Foam, 2.5x2.5 (in/in) (Home Health) (Generic) 3 x Per Week/30 Days Discharge Instructions: Apply Hydrofera Blue Ready to wound bed as directed Secondary Dressing: Zetuvit Plus 4x4 (in/in) (Home Health) 3 x Per Week/30 Days Secured With: Coban Cohesive Bandage 4x5 (yds) Stretched (Home Health) 3 x Per Week/30 Days Discharge Instructions: Apply Coban as directed. Secured With: [employer] or Non-Sterile 6-ply 4.5x4 (yd/yd) (Home Health) 3 x Per Week/30 Days Discharge Instructions: Apply Kerlix as directed WOUND #4: - Toe Great Wound Laterality: Right, Distal Topical: bacitracin (Home Health) 1 x Per Day/30 Days Secondary Dressing: Coverlet Latex-Free Fabric Adhesive Dressings (Home Health) 1 x Per Day/30 Days Discharge Instructions: Knuckle WOUND #5: - Toe Great Wound Laterality: Left, Distal Topical: bacitracin (Home Health) 1 x Per Day/30 Days Secondary Dressing: Coverlet Latex-Free Fabric Adhesive Dressings (Home Health) 1 x Per Day/30 Days Discharge Instructions: Knuckle 1. In office sharp debridement 2. Hydrofera Blue under Kerlix/Coban to lower extremities bilaterally 3. Hydrofera Blue to the left upper leg wound 4. Bacitracin ointment to the feet wounds 5. Nurse visit 6. Follow-up in 1 week Electronic Signature(s) Signed: 06/25/2022 7:24:59 AM  By: Gretta Cool, BSN, RN, CWS, Kim RN, BSN Signed: 06/25/2022 12:20:24 PM By: Kalman Shan DO Previous Signature: 05/19/2022 1:41:56 PM Version By: Kalman Shan DO Entered By: Gretta Cool BSN, RN, CWS, Kim on 06/25/2022 07:24:59 KAYDANCE, BOWIE ([id number]) -------------------------------------------------------------------------------- ROS/PFSH Details Patient Name: Stephanie Levy Date of Service: 05/19/2022 10:00 AM Medical Record Number: [id number] Patient Account Number: [id number] Date of Birth/Sex: 1981-02-26 (41 y.o. F) Treating RN: Cornell Barman Primary Care Provider: Billie Lade Other Clinician: Referring Provider: Billie Lade Treating Provider/Extender: Yaakov Guthrie in Treatment: 0 Information Obtained From Patient Constitutional Symptoms (General Health) Complaints and Symptoms: Positive for: Fatigue; Chills Eyes Complaints and Symptoms: Negative for: Dry Eyes; Vision Changes; Glasses / Contacts Ear/Nose/Mouth/Throat Complaints and Symptoms: Negative for: Difficult clearing ears; Sinusitis Hematologic/Lymphatic Complaints and Symptoms: Negative for: Bleeding / Clotting Disorders; Human Immunodeficiency Virus Medical History: Positive for: Anemia Respiratory Complaints and Symptoms: Negative for: Chronic or frequent coughs; Shortness of Breath Cardiovascular Complaints and Symptoms: Positive for: LE edema - some times Medical History: Positive for: Hypertension Gastrointestinal Complaints and Symptoms: Positive for: Frequent diarrhea Medical History: Positive for: Cirrhosis Genitourinary Complaints and Symptoms: Negative for: Kidney failure/ Dialysis; Incontinence/dribbling Immunological Complaints and Symptoms: Negative for: Hives; Itching Integumentary (Skin) Stephanie Levy, Stephanie Levy ([id number]) Complaints and Symptoms: Positive for: Wounds; Bleeding or bruising tendency Musculoskeletal Complaints and Symptoms: Positive for: Muscle Weakness Medical  History: Past Medical History Notes: Spinal Fusion titanium rod in back Neurologic Complaints and Symptoms: Negative for: Numbness/parasthesias; Focal/Weakness Psychiatric Complaints and Symptoms: Negative for: Anxiety; Claustrophobia Endocrine Medical History: Positive for: Type II Diabetes Time with diabetes: 5 Treated with: Oral agents Blood sugar tested every day: No Oncologic Immunizations Pneumococcal Vaccine: Received Pneumococcal Vaccination: No Implantable Devices None Family and Social History Never smoker; Marital Status - Married; Alcohol Use: Never; Drug Use: No History; Caffeine Use: Daily Electronic Signature(s) Signed: 05/19/2022 1:41:56 PM By: Kalman Shan DO Signed: 05/20/2022 7:54:04 AM By: Gretta Cool, BSN, RN, CWS, Kim RN, BSN Entered By: Gretta Cool, BSN, RN, CWS, Kim on 05/19/2022 10:51:56 Stephanie Levy, Stephanie Levy ([id number]) -------------------------------------------------------------------------------- SuperBill Details Patient Name: Stephanie Levy Date of Service: 05/19/2022 Medical Record Number: [id number] Patient Account Number: [id number] Date of Birth/Sex: 14-Jun-1981 (41 y.o. 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 E46 Unspecified protein-calorie malnutrition S81.801A Unspecified open wound, right lower leg, initial encounter S81.802A Unspecified open wound, left lower leg, initial encounter E11.622 Type 2 diabetes mellitus with other skin ulcer E11.621 Type 2 diabetes mellitus with foot ulcer Facility Procedures CPT4 Code: [id number] Description: [facility] VISIT-LEV 3 EST PT Modifier: Quantity: 1 CPT4 Code: [id number] Description: 31497 - DEB SUBQ TISSUE 20 SQ CM/< Modifier: Quantity: 1 CPT4  Code: Description: ICD-10 Diagnosis Description S81.802A Unspecified open wound, left lower leg, initial encounter Modifier: Quantity: Physician Procedures CPT4 Code: 0263785 Description: 88502 - WC PHYS LEVEL 4 - NEW PT Modifier: Quantity: 1 CPT4 Code: Description: ICD-10 Diagnosis Description S81.801A Unspecified open wound, right lower leg, initial encounter S81.802A Unspecified open wound, left lower leg, initial encounter D74.12 Alcoholic cirrhosis of liver without ascites E46 Unspecified  protein-calorie malnutrition Modifier: Quantity: CPT4 Code: [id number] Description: 11042 - WC PHYS SUBQ TISS 20 SQ CM Modifier: Quantity: 1 CPT4 Code: Description: ICD-10 Diagnosis Description S81.802A Unspecified open wound, left lower leg, initial encounter Modifier: Quantity: Electronic Signature(s) Signed: 05/19/2022 1:41:56 PM By: Kalman Shan DO Entered By: Kalman Shan on 05/19/2022 12:20:07

## 2022-05-21 NOTE — Progress Notes (Addendum)
Stephanie Levy ([id number]) Visit Report for 05/19/2022 Allergy List Details Patient Name: Stephanie Levy, Stephanie Levy Date of Service: 05/19/2022 10:00 AM Medical Record Number: [id number] Patient Account Number: [id number] Date of Birth/Sex: 10/22/1980 (41 y.o. F) Treating RN: Huel Coventry Primary Care Lively Haberman: Adaline Sill Other Clinician: Referring Hidaya Daniel: Adaline Sill Treating Junell Cullifer/Extender: Tilda Franco in Treatment: 0 Allergies Active Allergies amoxicillin Allergy Notes Electronic Signature(s) Signed: 05/20/2022 7:54:04 AM By: Elliot Gurney, BSN, RN, CWS, Kim RN, BSN Entered By: Elliot Gurney, BSN, RN, CWS, Kim on 05/19/2022 10:47:09 Stephanie Levy ([id number]) -------------------------------------------------------------------------------- Arrival Information Details Patient Name: Stephanie Levy Date of Service: 05/19/2022 10:00 AM Medical Record Number: [id number] Patient Account Number: [id number] Date of Birth/Sex: 1981-08-16 (41 y.o. F) Treating RN: Huel Coventry Primary Care Inez Stantz: Adaline Sill Other Clinician: Referring Sephora Boyar: Adaline Sill Treating Kendyl Bissonnette/Extender: Tilda Franco in Treatment: 0 Visit Information Patient Arrived: Wheel Chair Arrival Time: 10:32 Accompanied By: self Transfer Assistance: Manual Patient Identification Verified: Yes Secondary Verification Process Completed: Yes Patient Has Alerts: Yes Patient Alerts: Type II Diabetic Notes Patient walked in today for her appointment but needed assistance to get up from the chair in the lobby. Patient was then placed in a wheelchair and taken to treatment room. Manual assistance needed to stand and transfer into treatment chair. Electronic Signature(s) Signed: 05/20/2022 7:54:04 AM By: Elliot Gurney, BSN, RN, CWS, Kim RN, BSN Entered By: Elliot Gurney, BSN, RN, CWS, Kim on 05/19/2022 10:44:43 Stephanie Levy ([id number]) -------------------------------------------------------------------------------- Clinic Level  of Care Assessment Details Patient Name: Stephanie Levy Date of Service: 05/19/2022 10:00 AM Medical Record Number: [id number] Patient Account Number: [id number] Date of Birth/Sex: 1981/03/08 (41 y.o. F) Treating RN: Huel Coventry Primary Care Blayde Bacigalupi: Adaline Sill Other Clinician: Referring Dewaun Kinzler: Adaline Sill Treating Valente Fosberg/Extender: Tilda Franco in Treatment: 0 Clinic Level of Care Assessment Items TOOL 1 Quantity Score []  - Use when EandM and Procedure is performed on INITIAL visit 0 ASSESSMENTS - Nursing Assessment / Reassessment X - General Physical Exam (combine w/ comprehensive assessment (listed just below) when performed on new 1 20 pt. evals) X- 1 25 Comprehensive Assessment (HX, ROS, Risk Assessments, Wounds Hx, etc.) ASSESSMENTS - Wound and Skin Assessment / Reassessment X - Dermatologic / Skin Assessment (not related to wound area) 1 10 ASSESSMENTS - Ostomy and/or Continence Assessment and Care []  - Incontinence Assessment and Management 0 []  - 0 Ostomy Care Assessment and Management (repouching, etc.) PROCESS - Coordination of Care X - Simple Patient / Family Education for ongoing care 1 15 []  - 0 Complex (extensive) Patient / Family Education for ongoing care X- 1 10 Staff obtains , Records, Test Results / Process Orders []  - 0 Staff telephones HHA, Nursing Homes / Clarify orders / etc []  - 0 Routine Transfer to another Facility (non-emergent condition) []  - 0 Routine Hospital Admission (non-emergent condition) X- 1 15 New Admissions / / Ordering NPWT, Apligraf, etc. []  - 0 Emergency Hospital Admission (emergent condition) PROCESS - Special Needs []  - Pediatric / Minor Patient Management 0 []  - 0 Isolation Patient Management []  - 0 Hearing / Language / Visual special needs []  - 0 Assessment of Community assistance (transportation, D/C planning, etc.) []  - 0 Additional assistance / Altered mentation []  -  0 Support Surface(s) Assessment (bed, cushion, seat, etc.) INTERVENTIONS - Miscellaneous []  - External ear exam 0 []  - 0 Patient Transfer (multiple staff / / Similar devices) []  - 0 Simple Staple / Suture removal (25 or less) []  - 0 Complex Staple / Suture removal (  26 or more) []  - 0 Hypo/Hyperglycemic Management (do not check if billed separately) X- 1 15 Ankle / Brachial Index (ABI) - do not check if billed separately Has the patient been seen at the hospital within the last three years: Yes Total Score: 110 Level Of Care: New/Established - Level 3 Polgar, Emerlyn ([id number]) Electronic Signature(s) Signed: 05/20/2022 7:54:04 AM By: Elliot Gurney, BSN, RN, CWS, Kim RN, BSN Entered By: Elliot Gurney, BSN, RN, CWS, Kim on 05/19/2022 12:17:28 Stephanie Levy ([id number]) -------------------------------------------------------------------------------- Encounter Discharge Information Details Patient Name: Stephanie Levy Date of Service: 05/19/2022 10:00 AM Medical Record Number: [id number] Patient Account Number: [id number] Date of Birth/Sex: 1981-02-25 (41 y.o. F) Treating RN: Huel Coventry Primary Care Mahaley Schwering: Adaline Sill Other Clinician: Referring Yoana Staib: Adaline Sill Treating Jamisyn Langer/Extender: Tilda Franco in Treatment: 0 Encounter Discharge Information Items Post Procedure Vitals Discharge Condition: Stable Unable to obtain vitals Reason: limited time Ambulatory Status: Ambulatory Discharge Destination: Home Transportation: Private Auto Schedule Follow-up Appointment: Yes Clinical Summary of Care: Electronic Signature(s) Signed: 05/19/2022 3:29:10 PM By: Elliot Gurney, BSN, RN, CWS, Kim RN, BSN Entered By: Elliot Gurney, BSN, RN, CWS, Kim on 05/19/2022 15:29:09 Stephanie Levy ([id number]) -------------------------------------------------------------------------------- Lower Extremity Assessment Details Patient Name: Stephanie Levy Date of Service: 05/19/2022 10:00 AM Medical  Record Number: [id number] Patient Account Number: [id number] Date of Birth/Sex: [birth year] (41 y.o. F) Treating RN: Huel Coventry Primary Care Deetta Siegmann: Adaline Sill Other Clinician: Referring Jaceion Aday: Adaline Sill Treating Shandelle Borrelli/Extender: Tilda Franco in Treatment: 0 Edema Assessment Assessed: Kyra Searles: Yes] [Right: No] Edema: [Left: Yes] [Right: Yes] Calf Left: Right: Point of Measurement: 30 cm From Medial Instep 34.5 cm 33 cm Ankle Left: Right: Point of Measurement: 10 cm From Medial Instep 27 cm 27 cm Vascular Assessment Pulses: Dorsalis Pedis Palpable: [Left:No] [Right:No] Doppler Audible: [Left:Yes] [Right:Yes] Posterior Tibial Palpable: [Left:No] [Right:No] Doppler Audible: [Left:Yes] [Right:Yes] Blood Pressure: Brachial: [Left:117] Ankle: [Left:Dorsalis Pedis: 160 1.37] [Right:Dorsalis Pedis: 161 1.38] Electronic Signature(s) Signed: 05/20/2022 7:54:04 AM By: Elliot Gurney, BSN, RN, CWS, Kim RN, BSN Entered By: Elliot Gurney, BSN, RN, CWS, Kim on 05/19/2022 11:40:16 Lichty, Zasha ([id number]) -------------------------------------------------------------------------------- Multi Wound Chart Details Patient Name: Stephanie Levy Date of Service: 05/19/2022 10:00 AM Medical Record Number: [id number] Patient Account Number: [id number] Date of Birth/Sex: [birth year] (41 y.o. F) Treating RN: Huel Coventry Primary Care Nathaneal Sommers: Adaline Sill Other Clinician: Referring Moosa Bueche: Adaline Sill Treating Makisha Marrin/Extender: Tilda Franco in Treatment: 0 Vital Signs Height(in): 68 Pulse(bpm): 106 Weight(lbs): 153 Blood Pressure(mmHg): 128/88 Body Mass Index(BMI): 23.3 Temperature(F): 97.7 Respiratory Rate(breaths/min): 16 Photos: Wound Location: Left, Proximal, Medial Lower Leg Left, Distal, Medial Lower Leg Left, Midline Upper Leg Wounding Event: Gradually Appeared Gradually Appeared Gradually Appeared Primary Etiology: Atypical Atypical Atypical Comorbid History:  Anemia, Hypertension, Cirrhosis , Anemia, Hypertension, Cirrhosis , Anemia, Hypertension, Cirrhosis , Type II Diabetes Type II Diabetes Type II Diabetes Date Acquired: 05/07/2022 05/07/2022 05/07/2022 Weeks of Treatment: 0 0 0 Wound Status: Open Open Open Wound Recurrence: No No No Clustered Wound: No No Yes Clustered Quantity: N/A N/A 5 Measurements L x W x D (cm) 1x1.1x0.1 1x2.2x0.1 5x3x0.1 Area (cm) : 0.864 1.728 11.781 Volume (cm) : 0.086 0.173 1.178 % Reduction in Area: 0.00% 0.00% 0.00% % Reduction in Volume: 0.00% 0.00% 0.00% Classification: Unclassifiable Unclassifiable Unclassifiable Exudate Amount: Large Large Large Exudate Type: Serous Serous Serous Exudate Color: amber amber amber Wound Margin: Flat and Intact Flat and Intact N/A Granulation Amount: None Present (0%) None Present (0%) None Present (0%) Granulation Quality: N/A N/A N/A Necrotic Amount: Large (67-100%) Large (67-100%) Large (67-100%)  Exposed Structures: Fascia: No Fascia: No Fascia: No Fat Layer (Subcutaneous Tissue): Fat Layer (Subcutaneous Tissue): Fat Layer (Subcutaneous Tissue): No No No Tendon: No Tendon: No Tendon: No Muscle: No Muscle: No Muscle: No Joint: No Joint: No Joint: No Bone: No Bone: No Bone: No Epithelialization: None None None Debridement: Debridement - Excisional Debridement - Excisional Debridement - Excisional Pre-procedure Verification/Time 11:47 11:47 11:47 Out Taken: Tissue Debrided: Subcutaneous, Slough Subcutaneous, Slough Subcutaneous, Slough Level: Skin/Subcutaneous Tissue Skin/Subcutaneous Tissue Skin/Subcutaneous Tissue Debridement Area (sq cm): 1.1 2.2 15 Instrument: Curette Curette Curette Bleeding: Minimum Minimum Minimum Hemostasis Achieved: Pressure Pressure Pressure Procedure was tolerated well Procedure was tolerated well Procedure was tolerated well Scotto, Arkie ([id number]) Debridement Treatment Response: Post Debridement 1x1.1x0.1 1x2.2x0.1  5x3x0.1 Measurements L x W x D (cm) Post Debridement Volume: 0.086 0.173 1.178 (cm) Procedures Performed: Debridement Debridement Debridement Wound Number: 4 5 N/A Photos: N/A Wound Location: Right, Distal Toe Great Left, Distal Toe Great N/A Wounding Event: Gradually Appeared Gradually Appeared N/A 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 Lower N/A Extremity Extremity Comorbid History: Anemia, Hypertension, Cirrhosis , Anemia, Hypertension, Cirrhosis , N/A Type II Diabetes Type II Diabetes Date Acquired: 05/07/2022 05/07/2022 N/A Weeks of Treatment: 0 0 N/A Wound Status: Open Open N/A Wound Recurrence: No No N/A Clustered Wound: No No N/A Clustered Quantity: N/A N/A N/A Measurements L x W x D (cm) 2x2.5x0.1 2x2.8x0.1 N/A Area (cm) : 3.927 4.398 N/A Volume (cm) : 0.393 0.44 N/A % Reduction in Area: 0.00% 0.00% N/A % Reduction in Volume: 0.00% 0.00% N/A Classification: Grade 1 Grade 1 N/A Exudate Amount: Large Large N/A Exudate Type: Serous Serous N/A Exudate Color: amber amber N/A Wound Margin: N/A N/A N/A Granulation Amount: None Present (0%) Large (67-100%) N/A Granulation Quality: N/A Red N/A Necrotic Amount: None Present (0%) None Present (0%) N/A Exposed Structures: Fascia: No Fascia: No N/A Fat Layer (Subcutaneous Tissue): Fat Layer (Subcutaneous Tissue): No No Tendon: No Tendon: No Muscle: No Muscle: No Joint: No Joint: No Bone: No Bone: No Epithelialization: None Small (1-33%) N/A Debridement: N/A N/A N/A Tissue Debrided: N/A N/A N/A Level: N/A N/A N/A Debridement Area (sq cm): N/A N/A N/A Instrument: N/A N/A N/A Bleeding: N/A N/A N/A Hemostasis Achieved: N/A N/A N/A Debridement Treatment N/A N/A N/A Response: Post Debridement N/A N/A N/A Measurements L x W x D (cm) Post Debridement Volume: N/A N/A N/A (cm) Procedures Performed: N/A N/A N/A Treatment Notes Electronic Signature(s) Signed: 05/19/2022 1:41:56 PM  By: Dimas Millin, Ewelina ([id number]) Entered By: Geralyn Corwin on 05/19/2022 11:59:03 Begnaud, Pema ([id number]) -------------------------------------------------------------------------------- Multi-Disciplinary Care Plan Details Patient Name: Stephanie Levy Date of Service: 05/19/2022 10:00 AM Medical Record Number: [id number] Patient Account Number: [id number] Date of Birth/Sex: 07/18/1981 (41 y.o. F) Treating RN: Huel Coventry Primary Care Jowanna Loeffler: Adaline Sill Other Clinician: Referring Nyisha Clippard: Adaline Sill Treating Aengus Sauceda/Extender: Tilda Franco in Treatment: 0 Active Inactive Electronic Signature(s) Signed: 06/02/2022 9:42:28 AM By: Elliot Gurney, BSN, RN, CWS, Kim RN, BSN Previous Signature: 05/20/2022 7:54:04 AM Version By: Elliot Gurney, BSN, RN, CWS, Kim RN, BSN Entered By: Elliot Gurney, BSN, RN, CWS, Kim on 06/02/2022 09:42:28 Stephanie Levy ([id number]) -------------------------------------------------------------------------------- Non-Wound Condition Assessment Details Patient Name: Stephanie Levy Date of Service: 05/19/2022 10:00 AM Medical Record Number: [id number] Patient Account Number: [id number] Date of Birth/Sex: June 30, 1981 (41 y.o. F) Treating RN: Huel Coventry Primary Care Exodus Kutzer: Adaline Sill Other Clinician: Referring Deshay Kirstein: Adaline Sill Treating Graceland Wachter/Extender: Tilda Franco in Treatment: 0 Non-Wound Condition: Condition: Other Dermatologic Condition  Location: Leg Side: Bilateral Photos Electronic Signature(s) Signed: 05/20/2022 7:54:04 AM By: Elliot Gurney, BSN, RN, CWS, Kim RN, BSN Entered By: Elliot Gurney, BSN, RN, CWS, Kim on 05/19/2022 11:34:22 Ashton, Elmarie Shiley ([id number]) -------------------------------------------------------------------------------- Pain Assessment Details Patient Name: Stephanie Levy Date of Service: 05/19/2022 10:00 AM Medical Record Number: [id number] Patient Account Number: [id number] Date of Birth/Sex: 12-Sep-1981 (41  y.o. F) Treating RN: Huel Coventry Primary Care Garrison Michie: Adaline Sill Other Clinician: Referring Tiffeny Minchew: Adaline Sill Treating Naeem Quillin/Extender: Tilda Franco in Treatment: 0 Active Problems Location of Pain Severity and Description of Pain Patient Has Paino Yes Site Locations Pain Location: Pain in Ulcers Duration of the Pain. Constant / Intermittento Constant Rate the pain. Current Pain Level: 8 Character of Pain Describe the Pain: Sharp, Stabbing Pain Management and Medication Current Pain Management: Notes Patient has pain all the time in her legs. Electronic Signature(s) Signed: 05/20/2022 7:54:04 AM By: Elliot Gurney, BSN, RN, CWS, Kim RN, BSN Entered By: Elliot Gurney, BSN, RN, CWS, Kim on 05/19/2022 10:45:39 Stephanie Levy ([id number]) -------------------------------------------------------------------------------- Patient/Caregiver Education Details Patient Name: Stephanie Levy Date of Service: 05/19/2022 10:00 AM Medical Record Number: [id number] Patient Account Number: [id number] Date of Birth/Gender: [birth year] (41 y.o. F) Treating RN: Huel Coventry Primary Care Physician: Adaline Sill Other Clinician: Referring Physician: Adaline Sill Treating Physician/Extender: Tilda Franco in Treatment: 0 Education Assessment Education Provided To: Patient Education Topics Provided [facility]: Handouts: [facility] Methods: Demonstration, Explain/Verbal Responses: State content correctly Wound Debridement: Handouts: Wound Debridement Methods: Demonstration, Explain/Verbal Responses: State content correctly Wound/Skin Impairment: Electronic Signature(s) Signed: 05/20/2022 7:54:04 AM By: Elliot Gurney, BSN, RN, CWS, Kim RN, BSN Entered By: Elliot Gurney, BSN, RN, CWS, Kim on 05/19/2022 12:17:49 BASQUES, Elmarie Shiley ([id number]) -------------------------------------------------------------------------------- Wound Assessment Details Patient  Name: Stephanie Levy Date of Service: 05/19/2022 10:00 AM Medical Record Number: [id number] Patient Account Number: [id number] Date of Birth/Sex: 1980/10/20 (41 y.o. F) Treating RN: Huel Coventry Primary Care Hali Balgobin: Adaline Sill Other Clinician: Referring Zenith Lamphier: Adaline Sill Treating Merced Brougham/Extender: Tilda Franco in Treatment: 0 Wound Status Wound Number: 1 Primary Etiology: Atypical Wound Location: Left, Proximal, Medial Lower Leg Wound Status: Open Wounding Event: Gradually Appeared Comorbid History: Anemia, Hypertension, Cirrhosis , Type II Diabetes Date Acquired: 05/07/2022 Weeks Of Treatment: 0 Clustered Wound: No Photos Wound Measurements Length: (cm) 1 Width: (cm) 1.1 Depth: (cm) 0.1 Area: (cm) 0.864 Volume: (cm) 0.086 % Reduction in Area: 0% % Reduction in Volume: 0% Epithelialization: None Tunneling: No Undermining: No Wound Description Classification: Unclassifiable Wound Margin: Flat and Intact Exudate Amount: Large Exudate Type: Serous Exudate Color: amber Foul Odor After Cleansing: No Slough/Fibrino Yes Wound Bed Granulation Amount: None Present (0%) Exposed Structure Necrotic Amount: Large (67-100%) Fascia Exposed: No Necrotic Quality: Adherent Slough Fat Layer (Subcutaneous Tissue) Exposed: No Tendon Exposed: No Muscle Exposed: No Joint Exposed: No Bone Exposed: No Electronic Signature(s) Signed: 05/20/2022 7:54:04 AM By: Elliot Gurney, BSN, RN, CWS, Kim RN, BSN Entered By: Elliot Gurney, BSN, RN, CWS, Kim on 05/19/2022 11:30:53 Tramble, Micki ([id number]) -------------------------------------------------------------------------------- Wound Assessment Details Patient Name: Stephanie Levy Date of Service: 05/19/2022 10:00 AM Medical Record Number: [id number] Patient Account Number: [id number] Date of Birth/Sex: 1981-07-02 (41 y.o. F) Treating RN: Huel Coventry Primary Care Kadedra Vanaken: Adaline Sill Other Clinician: Referring Gentri Guardado: Adaline Sill Treating Arlesia Kiel/Extender: Tilda Franco in Treatment: 0 Wound Status Wound Number: 2 Primary Etiology: Atypical Wound Location: Left, Distal, Medial Lower Leg Wound Status: Open Wounding Event: Gradually Appeared Comorbid History: Anemia, Hypertension, Cirrhosis , Type II Diabetes Date Acquired: 05/07/2022  Weeks Of Treatment: 0 Clustered Wound: No Photos Wound Measurements Length: (cm) 1 Width: (cm) 2.2 Depth: (cm) 0.1 Area: (cm) 1.728 Volume: (cm) 0.173 % Reduction in Area: 0% % Reduction in Volume: 0% Epithelialization: None Tunneling: No Undermining: No Wound Description Classification: Unclassifiable Wound Margin: Flat and Intact Exudate Amount: Large Exudate Type: Serous Exudate Color: amber Foul Odor After Cleansing: No Slough/Fibrino Yes Wound Bed Granulation Amount: None Present (0%) Exposed Structure Necrotic Amount: Large (67-100%) Fascia Exposed: No Necrotic Quality: Adherent Slough Fat Layer (Subcutaneous Tissue) Exposed: No Tendon Exposed: No Muscle Exposed: No Joint Exposed: No Bone Exposed: No Electronic Signature(s) Signed: 05/20/2022 7:54:04 AM By: Elliot Gurney, BSN, RN, CWS, Kim RN, BSN Entered By: Elliot Gurney, BSN, RN, CWS, Kim on 05/19/2022 11:30:42 Span, Anabella ([id number]) -------------------------------------------------------------------------------- Wound Assessment Details Patient Name: Stephanie Levy Date of Service: 05/19/2022 10:00 AM Medical Record Number: [id number] Patient Account Number: [id number] Date of Birth/Sex: [birth year] (41 y.o. F) Treating RN: Huel Coventry Primary Care Kortnee Bas: Adaline Sill Other Clinician: Referring Zacharee Gaddie: Adaline Sill Treating Marien Manship/Extender: Tilda Franco in Treatment: 0 Wound Status Wound Number: 3 Primary Etiology: Atypical Wound Location: Left, Midline Upper Leg Wound Status: Open Wounding Event: Gradually Appeared Comorbid History: Anemia, Hypertension, Cirrhosis , Type  II Diabetes Date Acquired: 05/07/2022 Weeks Of Treatment: 0 Clustered Wound: Yes Photos Wound Measurements Length: (cm) 5 Width: (cm) 3 Depth: (cm) 0.1 Clustered Quantity: 5 Area: (cm) 11.781 Volume: (cm) 1.178 % Reduction in Area: 0% % Reduction in Volume: 0% Epithelialization: None Tunneling: No Undermining: No Wound Description Classification: Unclassifiable Exudate Amount: Large Exudate Type: Serous Exudate Color: amber Foul Odor After Cleansing: No Slough/Fibrino Yes Wound Bed Granulation Amount: None Present (0%) Exposed Structure Necrotic Amount: Large (67-100%) Fascia Exposed: No Necrotic Quality: Adherent Slough Fat Layer (Subcutaneous Tissue) Exposed: No Tendon Exposed: No Muscle Exposed: No Joint Exposed: No Bone Exposed: No Electronic Signature(s) Signed: 05/20/2022 7:54:04 AM By: Elliot Gurney, BSN, RN, CWS, Kim RN, BSN Entered By: Elliot Gurney, BSN, RN, CWS, Kim on 05/19/2022 11:31:04 Asper, Khushboo ([id number]) -------------------------------------------------------------------------------- Wound Assessment Details Patient Name: Stephanie Levy Date of Service: 05/19/2022 10:00 AM Medical Record Number: [id number] Patient Account Number: [id number] Date of Birth/Sex: [birth year] (41 y.o. F) Treating RN: Huel Coventry Primary Care Mohini Heathcock: Adaline Sill Other Clinician: Referring Kamarah Bilotta: Adaline Sill Treating Megan Hayduk/Extender: Tilda Franco in Treatment: 0 Wound Status Wound Number: 4 Primary Etiology: Diabetic Wound/Ulcer of the Lower Extremity Wound Location: Right, Distal Toe Great Wound Status: Open Wounding Event: Gradually Appeared Comorbid History: Anemia, Hypertension, Cirrhosis , Type II Diabetes Date Acquired: 05/07/2022 Weeks Of Treatment: 0 Clustered Wound: No Photos Wound Measurements Length: (cm) 2 Width: (cm) 2.5 Depth: (cm) 0.1 Area: (cm) 3.927 Volume: (cm) 0.393 % Reduction in Area: 0% % Reduction in Volume:  0% Epithelialization: None Tunneling: No Undermining: No Wound Description Classification: Grade 1 Exudate Amount: Large Exudate Type: Serous Exudate Color: amber Foul Odor After Cleansing: No Slough/Fibrino No Wound Bed Granulation Amount: None Present (0%) Exposed Structure Necrotic Amount: None Present (0%) Fascia Exposed: No Fat Layer (Subcutaneous Tissue) Exposed: No Tendon Exposed: No Muscle Exposed: No Joint Exposed: No Bone Exposed: No Electronic Signature(s) Signed: 05/20/2022 7:54:04 AM By: Elliot Gurney, BSN, RN, CWS, Kim RN, BSN Entered By: Elliot Gurney, BSN, RN, CWS, Kim on 05/19/2022 11:32:30 Millman, Krysten ([id number]) -------------------------------------------------------------------------------- Wound Assessment Details Patient Name: Stephanie Levy Date of Service: 05/19/2022 10:00 AM Medical Record Number: [id number] Patient Account Number: [id number] Date of Birth/Sex: [birth year] (41 y.o. F) Treating RN: Huel Coventry Primary Care Tylisha Danis: Adaline Sill Other  Clinician: Referring Ahmar Pickrell: Adaline Sill Treating Nicki Furlan/Extender: Tilda Franco in Treatment: 0 Wound Status Wound Number: 5 Primary Etiology: Diabetic Wound/Ulcer of the Lower Extremity Wound Location: Left, Distal Toe Great Wound Status: Open Wounding Event: Gradually Appeared Comorbid History: Anemia, Hypertension, Cirrhosis , Type II Diabetes Date Acquired: 05/07/2022 Weeks Of Treatment: 0 Clustered Wound: No Photos Wound Measurements Length: (cm) 2 Width: (cm) 2.8 Depth: (cm) 0.1 Area: (cm) 4.398 Volume: (cm) 0.44 % Reduction in Area: 0% % Reduction in Volume: 0% Epithelialization: Small (1-33%) Tunneling: No Undermining: No Wound Description Classification: Grade 1 Exudate Amount: Large Exudate Type: Serous Exudate Color: amber Foul Odor After Cleansing: No Slough/Fibrino No Wound Bed Granulation Amount: Large (67-100%) Exposed Structure Granulation Quality: Red Fascia  Exposed: No Necrotic Amount: None Present (0%) Fat Layer (Subcutaneous Tissue) Exposed: No Tendon Exposed: No Muscle Exposed: No Joint Exposed: No Bone Exposed: No Electronic Signature(s) Signed: 05/20/2022 7:54:04 AM By: Elliot Gurney, BSN, RN, CWS, Kim RN, BSN Entered By: Elliot Gurney, BSN, RN, CWS, Kim on 05/19/2022 11:33:23 Stephanie Levy ([id number]) -------------------------------------------------------------------------------- Vitals Details Patient Name: Stephanie Levy Date of Service: 05/19/2022 10:00 AM Medical Record Number: [id number] Patient Account Number: [id number] Date of Birth/Sex: 1981-06-17 (41 y.o. F) Treating RN: Huel Coventry Primary Care Sherrelle Prochazka: Adaline Sill Other Clinician: Referring Leviathan Macera: Adaline Sill Treating Omarrion Carmer/Extender: Tilda Franco in Treatment: 0 Vital Signs Time Taken: 10:45 Temperature (F): 97.7 Height (in): 68 Pulse (bpm): 106 Weight (lbs): 153 Respiratory Rate (breaths/min): 16 Body Mass Index (BMI): 23.3 Blood Pressure (mmHg): 128/88 Reference Range: 80 - 120 mg / dl Electronic Signature(s) Signed: 05/20/2022 7:54:04 AM By: Elliot Gurney, BSN, RN, CWS, Kim RN, BSN Entered By: Elliot Gurney, BSN, RN, CWS, Kim on 05/19/2022 10:46:03

## 2022-05-21 NOTE — Progress Notes (Signed)
VAIL, BASISTA ([id number]) Visit Report for 05/19/2022 Abuse Risk Screen Details Patient Name: Stephanie Levy, Stephanie Levy Date of Service: 05/19/2022 10:00 AM Medical Record Number: [id number] Patient Account Number: [id number] Date of Birth/Sex: 03-03-1981 (41 y.o. F) Treating RN: Huel Coventry Primary Care Fredis Malkiewicz: Adaline Sill Other Clinician: Referring Jameca Chumley: Adaline Sill Treating Ridhima Golberg/Extender: Tilda Franco in Treatment: 0 Abuse Risk Screen Items Answer ABUSE RISK SCREEN: Has anyone close to you tried to hurt or harm you recentlyo No Do you feel uncomfortable with anyone in your familyo No Has anyone forced you do things that you didnot want to doo No Electronic Signature(s) Signed: 05/20/2022 7:54:04 AM By: Elliot Gurney, BSN, RN, CWS, Kim RN, BSN Entered By: Elliot Gurney, BSN, RN, CWS, Kim on 05/19/2022 10:52:14 Stephanie Levy ([id number]) -------------------------------------------------------------------------------- Activities of Daily Living Details Patient Name: Stephanie Levy Date of Service: 05/19/2022 10:00 AM Medical Record Number: [id number] Patient Account Number: [id number] Date of Birth/Sex: 1981/06/07 (41 y.o. F) Treating RN: Huel Coventry Primary Care Willow Reczek: Adaline Sill Other Clinician: Referring Maddison Kilner: Adaline Sill Treating Donnell Wion/Extender: Tilda Franco in Treatment: 0 Activities of Daily Living Items Answer Activities of Daily Living (Please select one for each item) Drive Automobile Completely Able Take Medications Completely Able Use Telephone Completely Able Care for Appearance Completely Able Use Toilet Completely Able Bath / Shower Completely Able Dress Self Completely Able Feed Self Completely Able Walk Completely Able Get In / Out Bed Completely Able Housework Completely Able Prepare Meals Completely Able Handle Money Completely Able Shop for Self Completely Able Electronic Signature(s) Signed: 05/20/2022 7:54:04 AM By: Elliot Gurney, BSN,  RN, CWS, Kim RN, BSN Entered By: Elliot Gurney, BSN, RN, CWS, Kim on 05/19/2022 10:52:26 Stephanie Levy ([id number]) -------------------------------------------------------------------------------- Education Screening Details Patient Name: Stephanie Levy Date of Service: 05/19/2022 10:00 AM Medical Record Number: [id number] Patient Account Number: [id number] Date of Birth/Sex: 1981-10-03 (41 y.o. F) Treating RN: Huel Coventry Primary Care Jaxsen Bernhart: Adaline Sill Other Clinician: Referring Vernice Mannina: Adaline Sill Treating Blake Vetrano/Extender: Tilda Franco in Treatment: 0 Primary Learner Assessed: Patient Learning Preferences/Education Level/Primary Language Learning Preference: Explanation Highest Education Level: College or Above Preferred Language: English Cognitive Barrier Language Barrier: No Translator Needed: No Memory Deficit: No Emotional Barrier: No Physical Barrier Impaired Vision: No Impaired Hearing: No Decreased Hand dexterity: No Knowledge/Comprehension Knowledge Level: High Comprehension Level: High Ability to understand written instructions: High Ability to understand verbal instructions: High Motivation Anxiety Level: Calm Cooperation: Cooperative Education Importance: Acknowledges Need Interest in Health Problems: Asks Questions Perception: Coherent Willingness to Engage in Self-Management High Activities: Readiness to Engage in Self-Management High Activities: Psychologist, prison and probation services) Signed: 05/20/2022 7:54:04 AM By: Elliot Gurney, BSN, RN, CWS, Kim RN, BSN Entered By: Elliot Gurney, BSN, RN, CWS, Kim on 05/19/2022 10:52:56 Stephanie Levy ([id number]) -------------------------------------------------------------------------------- Fall Risk Assessment Details Patient Name: Stephanie Levy Date of Service: 05/19/2022 10:00 AM Medical Record Number: [id number] Patient Account Number: [id number] Date of Birth/Sex: 1981-04-28 (41 y.o. F) Treating RN: Huel Coventry Primary Care  Maxmillian Carsey: Adaline Sill Other Clinician: Referring Moselle Rister: Adaline Sill Treating Zoe Nordin/Extender: Tilda Franco in Treatment: 0 Fall Risk Assessment Items Have you had 2 or more falls in the last 12 monthso 0 Yes Have you had any fall that resulted in injury in the last 12 monthso 0 No FALLS RISK SCREEN History of falling - immediate or within 3 months 25 Yes Secondary diagnosis (Do you have 2 or more medical diagnoseso) 0 No Ambulatory aid None/bed rest/wheelchair/Levy 0 No Crutches/cane/walker 15 Yes Furniture 0 No Intravenous therapy Access/Saline/Heparin Lock 0 No Gait/Transferring Normal/ bed rest/  wheelchair 0 No Weak (short steps with or without shuffle, stooped but able to lift head while walking, may 0 No seek support from furniture) Impaired (short steps with shuffle, may have difficulty arising from chair, head down, impaired 20 Yes balance) Mental Status Oriented to own ability 0 Yes Electronic Signature(s) Signed: 05/20/2022 7:54:04 AM By: Elliot Gurney, BSN, RN, CWS, Kim RN, BSN Entered By: Elliot Gurney, BSN, RN, CWS, Kim on 05/19/2022 10:53:46 Stephanie Levy ([id number]) -------------------------------------------------------------------------------- Foot Assessment Details Patient Name: Stephanie Levy Date of Service: 05/19/2022 10:00 AM Medical Record Number: [id number] Patient Account Number: [id number] Date of Birth/Sex: 05-Aug-1981 (41 y.o. F) Treating RN: Huel Coventry Primary Care Wyatt Thorstenson: Adaline Sill Other Clinician: Referring Anthonee Gelin: Adaline Sill Treating Kynlei Piontek/Extender: Tilda Franco in Treatment: 0 Foot Assessment Items Site Locations + = Sensation present, - = Sensation absent, C = Callus, U = Ulcer R = Redness, W = Warmth, M = Maceration, PU = Pre-ulcerative lesion F = Fissure, S = Swelling, D = Dryness Assessment Right: Left: Other Deformity: No No Prior Foot Ulcer: No No Prior Amputation: No No Charcot Joint: No  No Ambulatory Status: Ambulatory With Help Assistance Device: Wheelchair Gait: Librarian, academic) Signed: 05/20/2022 7:54:04 AM By: Elliot Gurney, BSN, RN, CWS, Kim RN, BSN Entered By: Elliot Gurney, BSN, RN, CWS, Kim on 05/19/2022 10:59:26 Mazzocco, Elmarie Levy ([id number]) -------------------------------------------------------------------------------- Nutrition Risk Screening Details Patient Name: Stephanie Levy Date of Service: 05/19/2022 10:00 AM Medical Record Number: [id number] Patient Account Number: [id number] Date of Birth/Sex: October 29, 1980 (41 y.o. F) Treating RN: Huel Coventry Primary Care Audrick Lamoureaux: Adaline Sill Other Clinician: Referring Senan Urey: Adaline Sill Treating Karma Hiney/Extender: Tilda Franco in Treatment: 0 Height (in): 68 Weight (lbs): 153 Body Mass Index (BMI): 23.3 Nutrition Risk Screening Items Score Screening NUTRITION RISK SCREEN: I have an illness or condition that made me change the kind and/or amount of food I eat 0 No I eat fewer than two meals per day 0 No I eat few fruits and vegetables, or milk products 0 No I have three or more drinks of beer, liquor or wine almost every day 0 No I have tooth or mouth problems that make it hard for me to eat 0 No I don't always have enough money to buy the food I need 0 No I eat alone most of the time 0 No I take three or more different prescribed or over-the-counter drugs a day 0 No Without wanting to, I have lost or gained 10 pounds in the last six months 0 No I am not always physically able to shop, cook and/or feed myself 0 No Nutrition Protocols Good Risk Protocol Moderate Risk Protocol 0 Provide education on nutrition High Risk Proctocol Risk Level: Good Risk Score: 0 Electronic Signature(s) Signed: 05/20/2022 7:54:04 AM By: Elliot Gurney, BSN, RN, CWS, Kim RN, BSN Entered By: Elliot Gurney, BSN, RN, CWS, Kim on 05/19/2022 10:54:46

## 2022-05-22 ENCOUNTER — Inpatient Hospital Stay
Admission: EM | Admit: 2022-05-22 | Discharge: 2022-06-04 | DRG: 432 | Disposition: E | Payer: BC Managed Care – PPO | Attending: Obstetrics and Gynecology | Admitting: Obstetrics and Gynecology

## 2022-05-22 ENCOUNTER — Inpatient Hospital Stay: Payer: BC Managed Care – PPO

## 2022-05-22 DIAGNOSIS — E872 Acidosis, unspecified: Secondary | ICD-10-CM | POA: Diagnosis not present

## 2022-05-22 DIAGNOSIS — E8809 Other disorders of plasma-protein metabolism, not elsewhere classified: Secondary | ICD-10-CM | POA: Diagnosis present

## 2022-05-22 DIAGNOSIS — E1110 Type 2 diabetes mellitus with ketoacidosis without coma: Secondary | ICD-10-CM | POA: Diagnosis present

## 2022-05-22 DIAGNOSIS — F418 Other specified anxiety disorders: Secondary | ICD-10-CM | POA: Diagnosis present

## 2022-05-22 DIAGNOSIS — J9602 Acute respiratory failure with hypercapnia: Secondary | ICD-10-CM | POA: Diagnosis not present

## 2022-05-22 DIAGNOSIS — Z9884 Bariatric surgery status: Secondary | ICD-10-CM

## 2022-05-22 DIAGNOSIS — Z20822 Contact with and (suspected) exposure to covid-19: Secondary | ICD-10-CM | POA: Diagnosis present

## 2022-05-22 DIAGNOSIS — E1140 Type 2 diabetes mellitus with diabetic neuropathy, unspecified: Secondary | ICD-10-CM | POA: Diagnosis present

## 2022-05-22 DIAGNOSIS — Z880 Allergy status to penicillin: Secondary | ICD-10-CM

## 2022-05-22 DIAGNOSIS — G894 Chronic pain syndrome: Secondary | ICD-10-CM | POA: Diagnosis present

## 2022-05-22 DIAGNOSIS — E876 Hypokalemia: Secondary | ICD-10-CM | POA: Diagnosis present

## 2022-05-22 DIAGNOSIS — R680 Hypothermia, not associated with low environmental temperature: Secondary | ICD-10-CM | POA: Diagnosis present

## 2022-05-22 DIAGNOSIS — L8990 Pressure ulcer of unspecified site, unspecified stage: Secondary | ICD-10-CM | POA: Diagnosis present

## 2022-05-22 DIAGNOSIS — I10 Essential (primary) hypertension: Secondary | ICD-10-CM | POA: Diagnosis present

## 2022-05-22 DIAGNOSIS — E871 Hypo-osmolality and hyponatremia: Secondary | ICD-10-CM | POA: Diagnosis present

## 2022-05-22 DIAGNOSIS — D6959 Other secondary thrombocytopenia: Secondary | ICD-10-CM | POA: Diagnosis present

## 2022-05-22 DIAGNOSIS — R600 Localized edema: Secondary | ICD-10-CM | POA: Diagnosis present

## 2022-05-22 DIAGNOSIS — Z811 Family history of alcohol abuse and dependence: Secondary | ICD-10-CM

## 2022-05-22 DIAGNOSIS — E43 Unspecified severe protein-calorie malnutrition: Secondary | ICD-10-CM | POA: Diagnosis present

## 2022-05-22 DIAGNOSIS — K861 Other chronic pancreatitis: Secondary | ICD-10-CM | POA: Diagnosis present

## 2022-05-22 DIAGNOSIS — J9601 Acute respiratory failure with hypoxia: Secondary | ICD-10-CM | POA: Diagnosis not present

## 2022-05-22 DIAGNOSIS — L989 Disorder of the skin and subcutaneous tissue, unspecified: Secondary | ICD-10-CM

## 2022-05-22 DIAGNOSIS — Z6823 Body mass index (BMI) 23.0-23.9, adult: Secondary | ICD-10-CM

## 2022-05-22 DIAGNOSIS — I428 Other cardiomyopathies: Secondary | ICD-10-CM | POA: Diagnosis not present

## 2022-05-22 DIAGNOSIS — E119 Type 2 diabetes mellitus without complications: Secondary | ICD-10-CM

## 2022-05-22 DIAGNOSIS — Z515 Encounter for palliative care: Secondary | ICD-10-CM | POA: Diagnosis not present

## 2022-05-22 DIAGNOSIS — K766 Portal hypertension: Secondary | ICD-10-CM | POA: Diagnosis present

## 2022-05-22 DIAGNOSIS — E8770 Fluid overload, unspecified: Secondary | ICD-10-CM | POA: Diagnosis present

## 2022-05-22 DIAGNOSIS — R7989 Other specified abnormal findings of blood chemistry: Secondary | ICD-10-CM | POA: Diagnosis present

## 2022-05-22 DIAGNOSIS — F909 Attention-deficit hyperactivity disorder, unspecified type: Secondary | ICD-10-CM | POA: Diagnosis present

## 2022-05-22 DIAGNOSIS — L89899 Pressure ulcer of other site, unspecified stage: Secondary | ICD-10-CM | POA: Diagnosis present

## 2022-05-22 DIAGNOSIS — R6510 Systemic inflammatory response syndrome (SIRS) of non-infectious origin without acute organ dysfunction: Secondary | ICD-10-CM | POA: Diagnosis not present

## 2022-05-22 DIAGNOSIS — K7031 Alcoholic cirrhosis of liver with ascites: Principal | ICD-10-CM | POA: Diagnosis present

## 2022-05-22 DIAGNOSIS — K7030 Alcoholic cirrhosis of liver without ascites: Secondary | ICD-10-CM | POA: Diagnosis not present

## 2022-05-22 DIAGNOSIS — R1310 Dysphagia, unspecified: Secondary | ICD-10-CM | POA: Diagnosis present

## 2022-05-22 DIAGNOSIS — D689 Coagulation defect, unspecified: Secondary | ICD-10-CM | POA: Diagnosis present

## 2022-05-22 DIAGNOSIS — Z833 Family history of diabetes mellitus: Secondary | ICD-10-CM

## 2022-05-22 DIAGNOSIS — F419 Anxiety disorder, unspecified: Secondary | ICD-10-CM | POA: Diagnosis present

## 2022-05-22 DIAGNOSIS — D649 Anemia, unspecified: Secondary | ICD-10-CM | POA: Diagnosis not present

## 2022-05-22 DIAGNOSIS — N179 Acute kidney failure, unspecified: Secondary | ICD-10-CM | POA: Diagnosis present

## 2022-05-22 DIAGNOSIS — Z91018 Allergy to other foods: Secondary | ICD-10-CM

## 2022-05-22 DIAGNOSIS — F1020 Alcohol dependence, uncomplicated: Secondary | ICD-10-CM | POA: Diagnosis present

## 2022-05-22 DIAGNOSIS — Z79899 Other long term (current) drug therapy: Secondary | ICD-10-CM

## 2022-05-22 DIAGNOSIS — R64 Cachexia: Secondary | ICD-10-CM | POA: Diagnosis present

## 2022-05-22 DIAGNOSIS — K869 Disease of pancreas, unspecified: Secondary | ICD-10-CM | POA: Diagnosis present

## 2022-05-22 DIAGNOSIS — R188 Other ascites: Secondary | ICD-10-CM

## 2022-05-22 DIAGNOSIS — I878 Other specified disorders of veins: Secondary | ICD-10-CM | POA: Diagnosis present

## 2022-05-22 DIAGNOSIS — R21 Rash and other nonspecific skin eruption: Secondary | ICD-10-CM | POA: Diagnosis present

## 2022-05-22 DIAGNOSIS — A419 Sepsis, unspecified organism: Principal | ICD-10-CM

## 2022-05-22 DIAGNOSIS — K7682 Hepatic encephalopathy: Secondary | ICD-10-CM | POA: Diagnosis present

## 2022-05-22 DIAGNOSIS — K7210 Chronic hepatic failure without coma: Secondary | ICD-10-CM | POA: Diagnosis present

## 2022-05-22 DIAGNOSIS — Z7982 Long term (current) use of aspirin: Secondary | ICD-10-CM

## 2022-05-22 DIAGNOSIS — Z91010 Allergy to peanuts: Secondary | ICD-10-CM

## 2022-05-22 DIAGNOSIS — Z9981 Dependence on supplemental oxygen: Secondary | ICD-10-CM | POA: Diagnosis not present

## 2022-05-22 DIAGNOSIS — R34 Anuria and oliguria: Secondary | ICD-10-CM | POA: Diagnosis present

## 2022-05-22 DIAGNOSIS — F329 Major depressive disorder, single episode, unspecified: Secondary | ICD-10-CM | POA: Diagnosis present

## 2022-05-22 DIAGNOSIS — Z66 Do not resuscitate: Secondary | ICD-10-CM | POA: Diagnosis not present

## 2022-05-22 DIAGNOSIS — I872 Venous insufficiency (chronic) (peripheral): Secondary | ICD-10-CM | POA: Diagnosis present

## 2022-05-22 DIAGNOSIS — F1010 Alcohol abuse, uncomplicated: Secondary | ICD-10-CM | POA: Diagnosis present

## 2022-05-22 DIAGNOSIS — T68XXXA Hypothermia, initial encounter: Secondary | ICD-10-CM

## 2022-05-22 DIAGNOSIS — G629 Polyneuropathy, unspecified: Secondary | ICD-10-CM

## 2022-05-22 LAB — LITHIUM LEVEL: Lithium Lvl: <0.06 mmol/L — ABNORMAL LOW (ref 0.60–1.20)

## 2022-05-22 LAB — CBC
HCT: 18.1 % — ABNORMAL LOW (ref 36.0–46.0)
Hemoglobin: 6.3 g/dL — ABNORMAL LOW (ref 12.0–15.0)
MCH: 27.4 pg (ref 26.0–34.0)
MCHC: 34.8 g/dL (ref 30.0–36.0)
MCV: 78.7 fL — ABNORMAL LOW (ref 80.0–100.0)
Platelets: 111 10*3/uL — ABNORMAL LOW (ref 150–400)
RBC: 2.3 MIL/uL — ABNORMAL LOW (ref 3.87–5.11)
RDW: 17 % — ABNORMAL HIGH (ref 11.5–15.5)
WBC: 6.8 10*3/uL (ref 4.0–10.5)
nRBC: 0 % (ref 0.0–0.2)

## 2022-05-22 LAB — URINALYSIS, COMPLETE (UACMP) WITH MICROSCOPIC
Bacteria, UA: NONE SEEN
Bilirubin Urine: NEGATIVE
Glucose, UA: 50 mg/dL — AB
Hgb urine dipstick: NEGATIVE
Ketones, ur: NEGATIVE mg/dL
Leukocytes,Ua: NEGATIVE
Nitrite: NEGATIVE
Protein, ur: NEGATIVE mg/dL
Specific Gravity, Urine: 1.017 (ref 1.005–1.030)
pH: 5 (ref 5.0–8.0)

## 2022-05-22 LAB — COMPREHENSIVE METABOLIC PANEL
ALT: 32 U/L (ref 0–44)
AST: 36 U/L (ref 15–41)
Albumin: <1.5 g/dL — ABNORMAL LOW (ref 3.5–5.0)
Alkaline Phosphatase: 473 U/L — ABNORMAL HIGH (ref 38–126)
Anion gap: 15 (ref 5–15)
BUN: 16 mg/dL (ref 6–20)
CO2: 17 mmol/L — ABNORMAL LOW (ref 22–32)
Calcium: 7.5 mg/dL — ABNORMAL LOW (ref 8.9–10.3)
Chloride: 101 mmol/L (ref 98–111)
Creatinine, Ser: 2.04 mg/dL — ABNORMAL HIGH (ref 0.44–1.00)
GFR, Estimated: 31 mL/min — ABNORMAL LOW (ref >60)
Glucose, Bld: 386 mg/dL — ABNORMAL HIGH (ref 70–99)
Potassium: 3.1 mmol/L — ABNORMAL LOW (ref 3.5–5.1)
Sodium: 133 mmol/L — ABNORMAL LOW (ref 135–145)
Total Bilirubin: 1.7 mg/dL — ABNORMAL HIGH (ref 0.3–1.2)
Total Protein: 5 g/dL — ABNORMAL LOW (ref 6.5–8.1)

## 2022-05-22 LAB — MAGNESIUM
Magnesium: 1.4 mg/dL — ABNORMAL LOW (ref 1.7–2.4)
Magnesium: 1.6 mg/dL — ABNORMAL LOW (ref 1.7–2.4)

## 2022-05-22 LAB — BASIC METABOLIC PANEL
Anion gap: 11 (ref 5–15)
BUN: 13 mg/dL (ref 6–20)
CO2: 16 mmol/L — ABNORMAL LOW (ref 22–32)
Calcium: 6.2 mg/dL — CL (ref 8.9–10.3)
Chloride: 108 mmol/L (ref 98–111)
Creatinine, Ser: 1.55 mg/dL — ABNORMAL HIGH (ref 0.44–1.00)
GFR, Estimated: 43 mL/min — ABNORMAL LOW (ref >60)
Glucose, Bld: 324 mg/dL — ABNORMAL HIGH (ref 70–99)
Potassium: 2.7 mmol/L — CL (ref 3.5–5.1)
Sodium: 135 mmol/L (ref 135–145)

## 2022-05-22 LAB — CBC WITH DIFFERENTIAL/PLATELET
Abs Immature Granulocytes: 0.02 10*3/uL (ref 0.00–0.07)
Basophils Absolute: 0 10*3/uL (ref 0.0–0.1)
Basophils Relative: 0 %
Eosinophils Absolute: 0 10*3/uL (ref 0.0–0.5)
Eosinophils Relative: 0 %
HCT: 23.6 % — ABNORMAL LOW (ref 36.0–46.0)
Hemoglobin: 7.7 g/dL — ABNORMAL LOW (ref 12.0–15.0)
Immature Granulocytes: 0 %
Lymphocytes Relative: 23 %
Lymphs Abs: 1.7 10*3/uL (ref 0.7–4.0)
MCH: 26.6 pg (ref 26.0–34.0)
MCHC: 32.6 g/dL (ref 30.0–36.0)
MCV: 81.7 fL (ref 80.0–100.0)
Monocytes Absolute: 0.2 10*3/uL (ref 0.1–1.0)
Monocytes Relative: 3 %
Neutro Abs: 5.4 10*3/uL (ref 1.7–7.7)
Neutrophils Relative %: 74 %
Platelets: 155 10*3/uL (ref 150–400)
RBC: 2.89 MIL/uL — ABNORMAL LOW (ref 3.87–5.11)
RDW: 17.3 % — ABNORMAL HIGH (ref 11.5–15.5)
WBC: 7.5 10*3/uL (ref 4.0–10.5)
nRBC: 0 % (ref 0.0–0.2)

## 2022-05-22 LAB — URINE DRUG SCREEN, QUALITATIVE (ARMC ONLY)
Amphetamines, Ur Screen: POSITIVE — AB
Barbiturates, Ur Screen: NOT DETECTED
Benzodiazepine, Ur Scrn: NOT DETECTED
Cannabinoid 50 Ng, Ur ~~LOC~~: NOT DETECTED
Cocaine Metabolite,Ur ~~LOC~~: NOT DETECTED
MDMA (Ecstasy)Ur Screen: NOT DETECTED
Methadone Scn, Ur: NOT DETECTED
Opiate, Ur Screen: NOT DETECTED
Phencyclidine (PCP) Ur S: NOT DETECTED
Tricyclic, Ur Screen: NOT DETECTED

## 2022-05-22 LAB — CBG MONITORING, ED: Glucose-Capillary: 365 mg/dL — ABNORMAL HIGH (ref 70–99)

## 2022-05-22 LAB — T4, FREE: Free T4: 0.67 ng/dL (ref 0.61–1.12)

## 2022-05-22 LAB — GLUCOSE, CAPILLARY: Glucose-Capillary: 350 mg/dL — ABNORMAL HIGH (ref 70–99)

## 2022-05-22 LAB — PHOSPHORUS: Phosphorus: 3.7 mg/dL (ref 2.5–4.6)

## 2022-05-22 LAB — PREPARE RBC (CROSSMATCH)

## 2022-05-22 LAB — CK: Total CK: 66 U/L (ref 38–234)

## 2022-05-22 LAB — SARS CORONAVIRUS 2 BY RT PCR: SARS Coronavirus 2 by RT PCR: NEGATIVE

## 2022-05-22 LAB — C-REACTIVE PROTEIN: CRP: 19.3 mg/dL — ABNORMAL HIGH (ref <1.0)

## 2022-05-22 LAB — BETA-HYDROXYBUTYRIC ACID: Beta-Hydroxybutyric Acid: 0.15 mmol/L (ref 0.05–0.27)

## 2022-05-22 LAB — PROTIME-INR
INR: 1.5 — ABNORMAL HIGH (ref 0.8–1.2)
Prothrombin Time: 18.3 seconds — ABNORMAL HIGH (ref 11.4–15.2)

## 2022-05-22 LAB — AMMONIA: Ammonia: 95 umol/L — ABNORMAL HIGH (ref 9–35)

## 2022-05-22 LAB — TSH: TSH: 5.651 u[IU]/mL — ABNORMAL HIGH (ref 0.350–4.500)

## 2022-05-22 LAB — SEDIMENTATION RATE: Sed Rate: 35 mm/hr — ABNORMAL HIGH (ref 0–20)

## 2022-05-22 LAB — LACTIC ACID, PLASMA
Lactic Acid, Venous: 5.7 mmol/L (ref 0.5–1.9)
Lactic Acid, Venous: 4.7 mmol/L (ref 0.5–1.9)
Lactic Acid, Venous: 4.3 mmol/L (ref 0.5–1.9)

## 2022-05-22 LAB — LIPASE, BLOOD: Lipase: 16 U/L (ref 11–51)

## 2022-05-22 LAB — ABO/RH: ABO/RH(D): A NEG

## 2022-05-22 MED ORDER — LITHIUM CARBONATE 150 MG PO CAPS
150.0000 mg | ORAL_CAPSULE | Freq: Every evening | ORAL | Status: DC
Start: 2022-05-23 — End: 2022-05-23
  Filled 2022-05-22: qty 1

## 2022-05-22 MED ORDER — CLONAZEPAM 0.25 MG PO TBDP: 0.2500 mg | ORAL_TABLET | Freq: Two times a day (BID) | ORAL | Status: DC

## 2022-05-22 MED ORDER — LACTULOSE 10 GM/15ML PO SOLN
30.0000 g | Freq: Once | ORAL | Status: AC
  Administered 2022-05-22: 30 g via ORAL
  Filled 2022-05-22: qty 60

## 2022-05-22 MED ORDER — FENTANYL CITRATE PF 50 MCG/ML IJ SOSY
50.0000 ug | PREFILLED_SYRINGE | Freq: Once | INTRAMUSCULAR | Status: AC
  Administered 2022-05-22: 50 ug via INTRAVENOUS
  Filled 2022-05-22: qty 1

## 2022-05-22 MED ORDER — POTASSIUM CHLORIDE 20 MEQ PO PACK: 40.0000 meq | PACK | Freq: Once | ORAL | Status: DC

## 2022-05-22 MED ORDER — ASCORBIC ACID 500 MG PO TABS: 500.0000 mg | ORAL_TABLET | Freq: Two times a day (BID) | ORAL | Status: DC

## 2022-05-22 MED ORDER — ALBUMIN HUMAN 25 % IV SOLN
25.0000 g | Freq: Every day | INTRAVENOUS | Status: DC
Start: 2022-05-23 — End: 2022-05-23

## 2022-05-22 MED ORDER — HEPARIN SODIUM (PORCINE) 5000 UNIT/ML IJ SOLN
5000.0000 [IU] | Freq: Three times a day (TID) | INTRAMUSCULAR | Status: DC
  Administered 2022-05-22 – 2022-05-23 (×2): 5000 [IU] via SUBCUTANEOUS
  Filled 2022-05-22 (×2): qty 1

## 2022-05-22 MED ORDER — VANCOMYCIN HCL 1750 MG/350ML IV SOLN: 1750.0000 mg | INTRAVENOUS | Status: DC

## 2022-05-22 MED ORDER — TRAMADOL HCL 50 MG PO TABS: 50.0000 mg | ORAL_TABLET | Freq: Two times a day (BID) | ORAL | Status: DC | PRN

## 2022-05-22 MED ORDER — LACTULOSE 10 GM/15ML PO SOLN
30.0000 g | Freq: Three times a day (TID) | ORAL | Status: DC
  Administered 2022-05-22 – 2022-05-23 (×3): 30 g
  Filled 2022-05-22 (×3): qty 60

## 2022-05-22 MED ORDER — SODIUM CHLORIDE 0.9 % IV BOLUS
1000.0000 mL | Freq: Once | INTRAVENOUS | Status: AC
  Administered 2022-05-22: 1000 mL via INTRAVENOUS

## 2022-05-22 MED ORDER — SPIRONOLACTONE 25 MG PO TABS: 50.0000 mg | ORAL_TABLET | Freq: Every day | ORAL | Status: DC

## 2022-05-22 MED ORDER — CALCIUM GLUCONATE-NACL 2-0.675 GM/100ML-% IV SOLN
2.0000 g | Freq: Once | INTRAVENOUS | Status: AC
  Administered 2022-05-22: 2000 mg via INTRAVENOUS
  Filled 2022-05-22 (×2): qty 100

## 2022-05-22 MED ORDER — GABAPENTIN 300 MG PO CAPS
300.0000 mg | ORAL_CAPSULE | Freq: Two times a day (BID) | ORAL | Status: DC
Start: 2022-05-22 — End: 2022-05-22

## 2022-05-22 MED ORDER — ASPIRIN 81 MG PO TBEC
81.0000 mg | DELAYED_RELEASE_TABLET | Freq: Every day | ORAL | Status: DC
  Administered 2022-05-22: 81 mg via ORAL
  Filled 2022-05-22 (×2): qty 1

## 2022-05-22 MED ORDER — VANCOMYCIN HCL 1500 MG/300ML IV SOLN
1500.0000 mg | Freq: Once | INTRAVENOUS | Status: AC
  Administered 2022-05-22: 1500 mg via INTRAVENOUS
  Filled 2022-05-22: qty 300

## 2022-05-22 MED ORDER — LITHIUM CARBONATE 150 MG PO CAPS
150.0000 mg | ORAL_CAPSULE | Freq: Every evening | ORAL | Status: DC
Start: 2022-05-22 — End: 2022-05-22
  Filled 2022-05-22: qty 1

## 2022-05-22 MED ORDER — INSULIN ASPART 100 UNIT/ML IJ SOLN
0.0000 [IU] | INTRAMUSCULAR | Status: DC
  Administered 2022-05-22: 7 [IU] via SUBCUTANEOUS
  Administered 2022-05-23 (×4): 1 [IU] via SUBCUTANEOUS
  Administered 2022-05-23 (×2): 2 [IU] via SUBCUTANEOUS
  Administered 2022-05-23: 5 [IU] via SUBCUTANEOUS
  Administered 2022-05-24 (×3): 1 [IU] via SUBCUTANEOUS
  Administered 2022-05-24: 2 [IU] via SUBCUTANEOUS
  Administered 2022-05-24: 1 [IU] via SUBCUTANEOUS
  Administered 2022-05-25: 2 [IU] via SUBCUTANEOUS
  Administered 2022-05-25: 1 [IU] via SUBCUTANEOUS
  Filled 2022-05-22 (×15): qty 1

## 2022-05-22 MED ORDER — SODIUM BICARBONATE 8.4 % IV SOLN
100.0000 meq | Freq: Once | INTRAVENOUS | Status: AC
  Administered 2022-05-22: 100 meq via INTRAVENOUS
  Filled 2022-05-22: qty 50

## 2022-05-22 MED ORDER — ALBUTEROL SULFATE (2.5 MG/3ML) 0.083% IN NEBU
3.0000 mL | INHALATION_SOLUTION | Freq: Every day | RESPIRATORY_TRACT | Status: DC | PRN
Start: 2022-05-22 — End: 2022-05-26

## 2022-05-22 MED ORDER — SODIUM CHLORIDE 0.9% IV SOLUTION
Freq: Once | INTRAVENOUS | Status: AC
  Filled 2022-05-22: qty 250

## 2022-05-22 MED ORDER — MAGNESIUM SULFATE 4 GM/100ML IV SOLN
4.0000 g | Freq: Once | INTRAVENOUS | Status: AC
  Administered 2022-05-23: 4 g via INTRAVENOUS
  Filled 2022-05-22 (×2): qty 100

## 2022-05-22 MED ORDER — ASCORBIC ACID 500 MG PO TABS
500.0000 mg | ORAL_TABLET | Freq: Two times a day (BID) | ORAL | Status: DC
Start: 2022-05-22 — End: 2022-05-23
  Filled 2022-05-22 (×2): qty 1

## 2022-05-22 MED ORDER — FENTANYL CITRATE PF 50 MCG/ML IJ SOSY
12.5000 ug | PREFILLED_SYRINGE | INTRAMUSCULAR | Status: DC | PRN
  Administered 2022-05-25 (×2): 50 ug via INTRAVENOUS
  Administered 2022-05-25: 25 ug via INTRAVENOUS
  Filled 2022-05-22 (×3): qty 1

## 2022-05-22 MED ORDER — ADULT MULTIVITAMIN W/MINERALS CH: 1.0000 | ORAL_TABLET | Freq: Every day | ORAL | Status: DC

## 2022-05-22 MED ORDER — INSULIN ASPART 100 UNIT/ML IJ SOLN: 0.0000 [IU] | Freq: Three times a day (TID) | INTRAMUSCULAR | Status: DC

## 2022-05-22 MED ORDER — ALBUMIN HUMAN 5 % IV SOLN
25.0000 g | Freq: Once | INTRAVENOUS | Status: AC
  Administered 2022-05-22: 25 g via INTRAVENOUS
  Filled 2022-05-22: qty 500

## 2022-05-22 MED ORDER — SODIUM CHLORIDE 0.9 % IV SOLN
2.0000 g | Freq: Once | INTRAVENOUS | Status: AC
  Administered 2022-05-22: 2 g via INTRAVENOUS
  Filled 2022-05-22: qty 12.5

## 2022-05-22 MED ORDER — TRAZODONE HCL 50 MG PO TABS: 50.0000 mg | ORAL_TABLET | Freq: Every day | ORAL | Status: DC

## 2022-05-22 MED ORDER — SODIUM CHLORIDE 0.9 % IV BOLUS
500.0000 mL | Freq: Once | INTRAVENOUS | Status: AC
  Administered 2022-05-22: 500 mL via INTRAVENOUS

## 2022-05-22 MED ORDER — POTASSIUM CHLORIDE 10 MEQ/100ML IV SOLN
10.0000 meq | INTRAVENOUS | Status: AC
  Administered 2022-05-22 – 2022-05-23 (×4): 10 meq via INTRAVENOUS
  Filled 2022-05-22 (×4): qty 100

## 2022-05-22 MED ORDER — SODIUM CHLORIDE 0.9 % IV SOLN
2.0000 g | Freq: Two times a day (BID) | INTRAVENOUS | Status: DC
  Administered 2022-05-23 – 2022-05-25 (×6): 2 g via INTRAVENOUS
  Filled 2022-05-22 (×4): qty 12.5
  Filled 2022-05-22 (×2): qty 2

## 2022-05-22 MED ORDER — SODIUM CHLORIDE 0.9% FLUSH
3.0000 mL | Freq: Two times a day (BID) | INTRAVENOUS | Status: DC
  Administered 2022-05-23 – 2022-05-25 (×5): 3 mL via INTRAVENOUS

## 2022-05-22 MED ORDER — ENSURE ENLIVE PO LIQD
237.0000 mL | Freq: Three times a day (TID) | ORAL | Status: DC
  Administered 2022-05-23: 237 mL via ORAL

## 2022-05-22 MED ORDER — POTASSIUM CHLORIDE 10 MEQ/100ML IV SOLN
10.0000 meq | INTRAVENOUS | Status: AC
  Administered 2022-05-22 (×2): 10 meq via INTRAVENOUS
  Filled 2022-05-22 (×2): qty 100

## 2022-05-22 MED ORDER — ADULT MULTIVITAMIN W/MINERALS CH
1.0000 | ORAL_TABLET | Freq: Every day | ORAL | Status: DC
  Filled 2022-05-22: qty 1

## 2022-05-22 MED ORDER — TRAMADOL HCL 50 MG PO TABS
50.0000 mg | ORAL_TABLET | Freq: Two times a day (BID) | ORAL | Status: DC | PRN
  Administered 2022-05-22: 50 mg
  Filled 2022-05-22: qty 1

## 2022-05-22 MED ORDER — SODIUM CHLORIDE 0.9 % IV SOLN: INTRAVENOUS | Status: DC

## 2022-05-22 MED ORDER — PANCRELIPASE (LIP-PROT-AMYL) 12000-38000 UNITS PO CPEP
36000.0000 [IU] | ORAL_CAPSULE | Freq: Three times a day (TID) | ORAL | Status: DC
  Administered 2022-05-24: 36000 [IU] via ORAL
  Filled 2022-05-22 (×3): qty 3

## 2022-05-22 MED ORDER — SODIUM BICARBONATE 8.4 % IV SOLN
INTRAVENOUS | Status: AC
  Administered 2022-05-22: 50 meq
  Filled 2022-05-22: qty 50

## 2022-05-22 MED ORDER — HYDROCORTISONE SOD SUC (PF) 100 MG IJ SOLR
100.0000 mg | Freq: Once | INTRAMUSCULAR | Status: AC
  Administered 2022-05-22: 100 mg via INTRAVENOUS
  Filled 2022-05-22: qty 2

## 2022-05-22 MED ORDER — MAGNESIUM SULFATE 2 GM/50ML IV SOLN
2.0000 g | Freq: Once | INTRAVENOUS | Status: AC
  Administered 2022-05-22: 2 g via INTRAVENOUS
  Filled 2022-05-22: qty 50

## 2022-05-22 MED ORDER — FUROSEMIDE 40 MG PO TABS
40.0000 mg | ORAL_TABLET | Freq: Every day | ORAL | Status: DC
Start: 2022-05-22 — End: 2022-05-22

## 2022-05-22 NOTE — ED Notes (Addendum)
Report called to receiving ICU RN.

## 2022-05-22 NOTE — ED Notes (Signed)
Called [facility]) for medical transfer .

## 2022-05-22 NOTE — Consult Note (Signed)
CODE SEPSIS - PHARMACY COMMUNICATION  **Broad Spectrum Antibiotics should be administered within 1 hour of Sepsis diagnosis**  Time Code Sepsis Called/Page Received: 1544  Antibiotics Ordered: cefepime, vancomycin  Time of 1st antibiotic administration: 1255      Sharen Hones, PharmD, BCPS Clinical Pharmacist   05/05/2022  3:48 PM

## 2022-05-22 NOTE — ED Provider Notes (Signed)
[facility] Provider Note    Event Date/Time   First MD Initiated Contact with Patient 05/04/2022 1118     (approximate)   History   Fall and Hyperglycemia (Pt coming from home for fall, pt stating she slipped, no LOC and head injury, BG in the 400's, sores to legs and bottom/)   HPI  Stephanie Levy is a 41 y.o. female  with h/o cirrhosis, alcohol dependence (stopped drinking 5 mo ago), here with weakness, b/l leg pain/rash. Pt reports that her main reason for being here is progressive weakness and inability to walk. She has had worsening b/l leg rash and wounds for several weeks, was admitted here in July and told it was from venous stasis/swelling and dry skin. She has had increasing rash throughout her legs and now lower abdomen. She has also had decreased appetite, dizziness w/ standing, and weakness and now has difficulty even walking from her weakness. No mouth sores/sore throat. No dysuria. No change in bowel habits. Denies any new medications other than the ABX she was given during her hospitalization.       Physical Exam   Triage Vital Signs: ED Triage Vitals  Enc Vitals Group     BP 06/01/2022 1118 118/61     Pulse Rate 05/16/2022 1118 96     Resp 05/05/2022 1118 18     Temp 05/24/2022 1118 (!) 92 F (33.3 C)     Temp Source 05/09/2022 1118 Rectal     SpO2 05/30/2022 1118 100 %     Weight 05/04/2022 1151 153 lb (69.4 kg)     Height 05/16/2022 1151 5\' 8"  (1.727 m)     Head Circumference --      Peak Flow --      Pain Score 05/15/2022 1141 10     Pain Loc --      Pain Edu? --      Excl. in GC? --     Most recent vital signs: Vitals:   05/31/2022 1849 05/13/2022 1900  BP: 91/64 90/63  Pulse: 100 (!) 109  Resp: 18   Temp: (!) 94.3 F (34.6 C)   SpO2: 99% 99%     General: Awake. Appears chronically ill, cachectic. CV:  Good peripheral perfusion. Bilaterally. RRR. Resp:  Normal effort. Lungs clear. Abd:  Moderate distension w/ ascites. No guarding or  rebound. Other:  LE as below.  Skin: Severe, erythematous rash to bilateral LE. Primary wound appears to be macular, poorly-defined red/darker red lesions with secondary excoriations and ulcerations which have merged throughout the upper legs in particular, with weeping. No purulence. No crepitance. Bilateral red, dark wounds to tip of distal toes. 1+ dp/pt.             ED Results / Procedures / Treatments   Labs (all labs ordered are listed, but only abnormal results are displayed) Labs Reviewed  CBC WITH DIFFERENTIAL/PLATELET - Abnormal; Notable for the following components:      Result Value   RBC 2.89 (*)    Hemoglobin 7.7 (*)    HCT 23.6 (*)    RDW 17.3 (*)    All other components within normal limits  COMPREHENSIVE METABOLIC PANEL - Abnormal; Notable for the following components:   Sodium 133 (*)    Potassium 3.1 (*)    CO2 17 (*)    Glucose, Bld 386 (*)    Creatinine, Ser 2.04 (*)    Calcium 7.5 (*)    Total Protein 5.0 (*)  Albumin <1.5 (*)    Alkaline Phosphatase 473 (*)    Total Bilirubin 1.7 (*)    GFR, Estimated 31 (*)    All other components within normal limits  AMMONIA - Abnormal; Notable for the following components:   Ammonia 95 (*)    All other components within normal limits  SEDIMENTATION RATE - Abnormal; Notable for the following components:   Sed Rate 35 (*)    All other components within normal limits  TSH - Abnormal; Notable for the following components:   TSH 5.651 (*)    All other components within normal limits  PROTIME-INR - Abnormal; Notable for the following components:   Prothrombin Time 18.3 (*)    INR 1.5 (*)    All other components within normal limits  LACTIC ACID, PLASMA - Abnormal; Notable for the following components:   Lactic Acid, Venous 5.7 (*)    All other components within normal limits  LACTIC ACID, PLASMA - Abnormal; Notable for the following components:   Lactic Acid, Venous 4.7 (*)    All other components  within normal limits  MAGNESIUM - Abnormal; Notable for the following components:   Magnesium 1.4 (*)    All other components within normal limits  LITHIUM LEVEL - Abnormal; Notable for the following components:   Lithium Lvl <0.06 (*)    All other components within normal limits  SARS CORONAVIRUS 2 BY RT PCR  CULTURE, BLOOD (ROUTINE X 2)  CULTURE, BLOOD (ROUTINE X 2)  LIPASE, BLOOD  CK  T4, FREE  C-REACTIVE PROTEIN  URINE DRUG SCREEN, QUALITATIVE ([facility] ONLY)  HIV ANTIBODY (ROUTINE TESTING W REFLEX)  CBC WITH DIFFERENTIAL/PLATELET  HEPATIC FUNCTION PANEL  BASIC METABOLIC PANEL  MAGNESIUM  PHOSPHORUS  BASIC METABOLIC PANEL  MAGNESIUM  PHOSPHORUS  LACTIC ACID, PLASMA  CBC  CBC  PROTIME-INR  TYPE AND SCREEN     EKG    RADIOLOGY    I also independently reviewed and agree with radiologist interpretations.   PROCEDURES:  Critical Care performed: Yes, see critical care procedure note(s)  .Critical Care  Performed by: Shaune Pollack, MD Authorized by: Shaune Pollack, MD   Critical care provider statement:    Critical care time (minutes):  30   Critical care time was exclusive of:  Separately billable procedures and treating other patients   Critical care was necessary to treat or prevent imminent or life-threatening deterioration of the following conditions:  Respiratory failure, circulatory failure, cardiac failure and sepsis   Critical care was time spent personally by me on the following activities:  Development of treatment plan with patient or surrogate, discussions with consultants, evaluation of patient's response to treatment, examination of patient, ordering and review of laboratory studies, ordering and review of radiographic studies, ordering and performing treatments and interventions, pulse oximetry, re-evaluation of patient's condition and review of old charts     MEDICATIONS ORDERED IN ED: Medications  aspirin EC tablet 81 mg (has no  administration in time range)  traMADol (ULTRAM) tablet 50 mg (has no administration in time range)  furosemide (LASIX) tablet 40 mg (has no administration in time range)  spironolactone (ALDACTONE) tablet 50 mg (has no administration in time range)  lithium carbonate capsule 150 mg (has no administration in time range)  traZODone (DESYREL) tablet 50 mg (has no administration in time range)  lipase/protease/amylase (CREON) capsule 36,000 Units (has no administration in time range)  clonazePAM (KLONOPIN) disintegrating tablet 0.25-0.5 mg (has no administration in time range)  gabapentin (NEURONTIN) capsule  300 mg (has no administration in time range)  ascorbic acid (VITAMIN C) tablet 500 mg (has no administration in time range)  feeding supplement (ENSURE ENLIVE / ENSURE PLUS) liquid 237 mL (has no administration in time range)  multivitamin with minerals tablet 1 tablet (has no administration in time range)  albuterol (VENTOLIN HFA) 108 (90 Base) MCG/ACT inhaler 1 puff (has no administration in time range)  insulin aspart (novoLOG) injection 0-9 Units (has no administration in time range)  heparin injection 5,000 Units (has no administration in time range)  sodium chloride flush (NS) 0.9 % injection 3 mL (has no administration in time range)  albumin human 25 % solution 25 g (has no administration in time range)  fentaNYL (SUBLIMAZE) injection 12.5-50 mcg (has no administration in time range)  ceFEPIme (MAXIPIME) 2 g in sodium chloride 0.9 % 100 mL IVPB (has no administration in time range)  vancomycin (VANCOREADY) IVPB 1750 mg/350 mL (has no administration in time range)  sodium chloride 0.9 % bolus 1,000 mL (0 mLs Intravenous Stopped 06/14/22 1436)  vancomycin (VANCOREADY) IVPB 1500 mg/300 mL (0 mg Intravenous Stopped 06-14-2022 1552)  ceFEPIme (MAXIPIME) 2 g in sodium chloride 0.9 % 100 mL IVPB (0 g Intravenous Stopped Jun 14, 2022 1338)  albumin human 5 % solution 25 g (25 g Intravenous New  Bag/Given June 14, 2022 1423)  fentaNYL (SUBLIMAZE) injection 50 mcg (50 mcg Intravenous Given 06/14/22 1341)  potassium chloride 10 mEq in 100 mL IVPB (0 mEq Intravenous Stopped 06-14-22 1616)  lactulose (CHRONULAC) 10 GM/15ML solution 30 g (30 g Oral Given 06/14/2022 1549)  hydrocortisone sodium succinate (SOLU-CORTEF) 100 MG injection 100 mg (100 mg Intravenous Given June 14, 2022 1551)  sodium chloride 0.9 % bolus 500 mL (500 mLs Intravenous New Bag/Given 2022-06-14 1551)  magnesium sulfate IVPB 2 g 50 mL (0 g Intravenous Stopped 06/14/2022 1721)  fentaNYL (SUBLIMAZE) injection 50 mcg (50 mcg Intravenous Given 06/14/22 1839)     IMPRESSION / MDM / ASSESSMENT AND PLAN / ED COURSE  I reviewed the triage vital signs and the nursing notes.                               The patient is on the cardiac monitor to evaluate for evidence of arrhythmia and/or significant heart rate changes.   Ddx:  Differential includes the following, with pertinent life- or limb-threatening emergencies considered:  Sepsis from bilateral LE cellulitis, vasculitis, SJS, drug eruption; worsening cirrhosis with hyperammonemia; AKI; hepatorenal syndrome  Patient's presentation is most consistent with acute presentation with potential threat to life or bodily function.  MDM:  41 yo F with h/o alcohol abuse (no longer drinking), cirrhosis, here with weakness, hypothermia, b/l leg wounds. Suspect decompensated cirrhosis complicated by severe b/l wounds from edema, hypoalbuminemia, query possible vasculitis though biopsy from last visit showed no signs of this. Pt started on broad-spectrum ABX, IVF, and bair hugger. Albumin given as well for fluids/hypoalbuminemia. Mag repleted. LA 5.7, likely combination of her sepsis as well as liver dysfunction. Ammonia 90s but she is actually mentating well and o/w protecting airway.no leukocytosis noted. CBC with significant Cr elevation - likely combiantion of poor PO intake as well as ? HRS. Profound,  persistent hypothermia noted with AKI, cirrhosis, and question of vasculitis. Will give stress dose steroid for possibel adrenal insufficiency, broad-spectrum ABX, and admit to step down. ICU Dr. Belia Heman consulted, will follow and will admit to Hospitalist.  Of note, I attempted to contact  [facility], [facility], and [facility] given availability of dermatology, and all are closed to transfer. Feel pt should be admitted, stabilized here esp given recent negative bx, with transfer TBD primary team.   MEDICATIONS GIVEN IN ED: Medications  aspirin EC tablet 81 mg (has no administration in time range)  traMADol (ULTRAM) tablet 50 mg (has no administration in time range)  furosemide (LASIX) tablet 40 mg (has no administration in time range)  spironolactone (ALDACTONE) tablet 50 mg (has no administration in time range)  lithium carbonate capsule 150 mg (has no administration in time range)  traZODone (DESYREL) tablet 50 mg (has no administration in time range)  lipase/protease/amylase (CREON) capsule 36,000 Units (has no administration in time range)  clonazePAM (KLONOPIN) disintegrating tablet 0.25-0.5 mg (has no administration in time range)  gabapentin (NEURONTIN) capsule 300 mg (has no administration in time range)  ascorbic acid (VITAMIN C) tablet 500 mg (has no administration in time range)  feeding supplement (ENSURE ENLIVE / ENSURE PLUS) liquid 237 mL (has no administration in time range)  multivitamin with minerals tablet 1 tablet (has no administration in time range)  albuterol (VENTOLIN HFA) 108 (90 Base) MCG/ACT inhaler 1 puff (has no administration in time range)  insulin aspart (novoLOG) injection 0-9 Units (has no administration in time range)  heparin injection 5,000 Units (has no administration in time range)  sodium chloride flush (NS) 0.9 % injection 3 mL (has no administration in time range)  albumin human 25 % solution 25 g (has no administration in time range)  fentaNYL (SUBLIMAZE) injection  12.5-50 mcg (has no administration in time range)  ceFEPIme (MAXIPIME) 2 g in sodium chloride 0.9 % 100 mL IVPB (has no administration in time range)  vancomycin (VANCOREADY) IVPB 1750 mg/350 mL (has no administration in time range)  sodium chloride 0.9 % bolus 1,000 mL (0 mLs Intravenous Stopped 05/29/2022 1436)  vancomycin (VANCOREADY) IVPB 1500 mg/300 mL (0 mg Intravenous Stopped 05/19/2022 1552)  ceFEPIme (MAXIPIME) 2 g in sodium chloride 0.9 % 100 mL IVPB (0 g Intravenous Stopped 05/29/2022 1338)  albumin human 5 % solution 25 g (25 g Intravenous New Bag/Given 05/27/2022 1423)  fentaNYL (SUBLIMAZE) injection 50 mcg (50 mcg Intravenous Given 05/21/2022 1341)  potassium chloride 10 mEq in 100 mL IVPB (0 mEq Intravenous Stopped 05/15/2022 1616)  lactulose (CHRONULAC) 10 GM/15ML solution 30 g (30 g Oral Given 05/16/2022 1549)  hydrocortisone sodium succinate (SOLU-CORTEF) 100 MG injection 100 mg (100 mg Intravenous Given 05/27/2022 1551)  sodium chloride 0.9 % bolus 500 mL (500 mLs Intravenous New Bag/Given 05/09/2022 1551)  magnesium sulfate IVPB 2 g 50 mL (0 g Intravenous Stopped 05/30/2022 1721)  fentaNYL (SUBLIMAZE) injection 50 mcg (50 mcg Intravenous Given 05/06/2022 1839)     Consults:  ICU Hospitalist   EMR reviewed  Prior H&P, biopsy results, reviewed prior images     FINAL CLINICAL IMPRESSION(S) / ED DIAGNOSES   Final diagnoses:  Sepsis due to cellulitis (HCC)  AKI (acute kidney injury) (HCC)  Hypothermia, initial encounter     Rx / DC Orders   ED Discharge Orders     None        Note:  This document was prepared using Dragon voice recognition software and may include unintentional dictation errors.   Shaune Pollack, MD 05/26/2022 [phone]

## 2022-05-22 NOTE — Progress Notes (Addendum)
Cross Cover Chart Review Patient with marginal blood pressures, severely malnourished, NAGMA, hyperglycemia, hyperammonemia, borderline anemia & worsening AMS. Discussed case with Cliffton Asters, NP-TRH who is in agreement with plan of care below. Changes discussed with nursing. - f/u EKG - Type and Screen, Beta-hydroxy & UA ordered - f/u repeat CBC, order blood administration PRN, f/u PT/INR - Blood glucose checks changed to Q 4 with SSI > f/u repeat BMP, concern for DKA- consider insulin drip if needed - f/u BMP, Mg, Phos with aggressive electrolyte replacement - patient unable to take PO medications, NGT ordered - 500 mL bolus & continuous IVF ordered, f/u repeat lactic - lactulose continued TID, follow up ammonia in AM - Discontinued select outpatient medications due to marginal BP and lethargy: lasix, aldactone, gabapentin, trazodone, klonopin- consider restarting as patient stabilizes - STAT CTH wo contrast ordered due to AMS and recent fall   Additional Critical Care Time: 30 minutes  Betsey Holiday, AGACNP-BC Acute Care Nurse Practitioner [facility]   [phone] / [phone] Please see Amion for pager details.

## 2022-05-22 NOTE — Sepsis Progress Note (Signed)
Notified bedside nurse of need to draw lactic acid.  

## 2022-05-22 NOTE — Sepsis Progress Note (Signed)
Elink monitoring code sepsis 

## 2022-05-22 NOTE — Consult Note (Signed)
Pharmacy Antibiotic Note  Stephanie Levy is a 41 y.o. female admitted on 2022/06/09 with sepsis.  Pharmacy has been consulted for cefepime and vancomycin dosing.  8/19: Received Cefepime 2g IV x1 and Vancomycin 1500mg  IV x1 in ED  Plan: Give Cefepime 2g IV every 12 hours (first dose 8/20 at 0100) Give Vancomycin 1750mg  IV every 48 hours (first dose 8/21 at 1400) Goal AUC 400-550  Est AUC: 499.0 Est Cmax: 43.0 Est Cmin: 8.6 Calculated with SCr 2.04 mg/dL  Height: 5\' 8"  (172.7 cm) Weight: 69.4 kg (153 lb) IBW/kg (Calculated) : 63.9  Temp (24hrs), Avg:91.9 F (33.3 C), Min:91.7 F (33.2 C), Max:92 F (33.3 C)  Recent Labs  Lab 09-Jun-2022 1158 June 09, 2022 1450  WBC 7.5  --   CREATININE 2.04*  --   LATICACIDVEN  --  5.7*    Estimated Creatinine Clearance: 37 mL/min (A) (by C-G formula based on SCr of 2.04 mg/dL (H)).    Allergies  Allergen Reactions   Peanut-Containing Drug Products Anaphylaxis   Amoxicillin Other (See Comments)    Gi distress   Citrus Rash    Antimicrobials this admission: 8/19 Cefepime >>  8/19 Vancomycin >>    Microbiology results: 8/19 BCx: sent   Thank you for allowing pharmacy to be a part of this patient's care.  9/19 2022/06/09 4:51 PM

## 2022-05-22 NOTE — Consult Note (Signed)
PHARMACY -  BRIEF ANTIBIOTIC NOTE   Pharmacy has received consult(s) for vancomycin and cefepime from an ED provider.  The patient's profile has been reviewed for ht/wt/allergies/indication/available labs.    One time order(s) placed for vancomycin and cefepime  Further antibiotics/pharmacy consults should be ordered by admitting physician if indicated.                       Thank you,  Celene Squibb, PharmD PGY1 Pharmacy Resident 05/21/2022 12:12 PM

## 2022-05-22 NOTE — Consult Note (Signed)
NAME:  Stephanie Levy, MRN:  [id number], DOB:  1981/04/08, LOS: 0 ADMISSION DATE:  05/08/2022 CONSULTATION DATE:  05/10/2022  REFERRING MD:  Ellender Hose CHIEF COMPLAINT:  low temp and cant walk  BRIEF SYNOPSIS 41 year old African-American female with severe malnutrition with history of end-stage liver disease and liver cirrhosis due to alcohol abuse PCCM consulted for severe hypothermia   History of Present Illness:  41 y.o. female  with h/o ens stage liver cirrhosis, alcohol dependence (stopped drinking 5 mo ago),   Presented with bilateral lower extremity weakness and leg pain  Progressive weakness over the last 1 week and inability to walk  Patient has been losing significant amount of weight  Previously admitted for rash and wounds several weeks but likely venous stasis and swelling and dry skin  Signs and symptoms of orthostasis with dizziness with standing  No chest pain no shortness of breath Patient states increased appetite asking for food Temperature was noted to be 91 Patient placed on a Bair hugger  Pertinent labs in the ER Potassium 3.1 bicarb 17 blood sugar 386 Findings consistent with DKA with elevated anion gap Alkaline phos elevated at 473 Albumin less than 1.5 Ammonia level 95 T. bili 1.7 Hemoglobin 7.7 Platelet count 155 INR 1.5 TSH 5.6  Patient is alert and awake following commands no apparent distress No shortness of breath States she is hungry and wants to eat  Pertinent  Medical History   Active Ambulatory Problems    Diagnosis Date Noted   Alcoholic pancreatitis 87/86/7672   HTN (hypertension) 12/24/2018   Alcohol abuse 12/24/2018   Anxiety 12/24/2018   Major depressive disorder, recurrent episode, moderate ([place]) 12/25/2018   Skin lesions 09/47/0962   Alcoholic cirrhosis of liver with ascites ([place]) 04/02/2022   Depression with anxiety 04/02/2022   ADHD    Diabetes mellitus without complication (HCC)    Normocytic anemia    Bilateral lower  extremity edema    Pressure injury of skin 04/03/2022   Myocardial injury 04/29/2022   Pancreatic lesion 04/29/2022   Elevated lactic acid level 04/29/2022   Hypomagnesemia 04/29/2022   Protein-calorie malnutrition, severe 04/30/2022   Chronic pain syndrome 09/16/2017   Closed displaced fracture of posterior process of left talus 03/25/2017   History of gastric bypass 08/28/2013   Neuropathy 12/09/2014   Resolved Ambulatory Problems    Diagnosis Date Noted   Syncope 10/13/2017   Hypokalemia    Malnutrition of moderate degree 04/06/2022   Chest pain 04/29/2022   Past Medical History:  Diagnosis Date   Cholelithiasis    Depression    ETOH abuse    History of Diabetes ([place])    Hypertension    Insomnia    Low back pain    Morbid obesity ([place])    Pancreatitis      Significant Hospital Events: Including procedures, antibiotic start and stop dates in addition to other pertinent events   8/19 admitted to [facility] for DKA and hypothermia     Micro Data:  Blood cultures pending COVID test negative  Antimicrobials:   Antibiotics Given (last 72 hours)     Date/Time Action Medication Dose Rate   05/21/2022 1255 New Bag/Given   ceFEPIme (MAXIPIME) 2 g in sodium chloride 0.9 % 100 mL IVPB 2 g 200 mL/hr   05/05/2022 1346 New Bag/Given   vancomycin (VANCOREADY) IVPB 1500 mg/300 mL 1,500 mg 150 mL/hr        GI consultation with [facility] 05/11/2022 consultation & management of severe  protein calorie malnutrition.  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  Patient was recently admitted to [facility] on 05/01/2022, GI was consulted for new diagnosis of cirrhosis and chronic diarrhea, severe protein calorie malnutrition and skin lesions in bilateral lower extremities with suspicion for vasculitis or  pyoderma gangrenosum.  Patient underwent skin biopsy which was negative for vasculitis or pyoderma gangrenosum and consistent with stasis dermatitis.  During inpatient,  started her on Creon 72 K capsules with each meal and 36 K with snack.    Objective   Blood pressure 91/64, pulse 100, temperature (!) 91.7 F (33.2 C), temperature source Rectal, resp. rate 16, height '5\' 8"'  (1.727 m), weight 69.4 kg, last menstrual period 07/30/2020, SpO2 100 %.        Intake/Output Summary (Last 24 hours) at 05/19/2022 1623 Last data filed at 05/11/2022 1616 Gross per 24 hour  Intake 1600 ml  Output --  Net 1600 ml   Filed Weights   05/24/2022 1151  Weight: 69.4 kg     Review of Systems:  Gen:+weight loss  HEENT: Small ulcers on the lips Cardiac:+dizziness, -chest pain or heaviness, -chest tightness,edema, No JVD Resp:   No cough, -sputum production, -shortness of breath,-wheezing, -hemoptysis,  Gi: Denies swallowing difficulty, stomach pain, nausea or vomiting, diarrhea, constipation, bowel incontinence Gu:  Denies bladder incontinence, burning urine Ext:   Joint pain with inability to move generalized weakness Skin: +skin rash, easy bruising ecchymosis Other:  All other systems negative      PHYSICAL EXAMINATION:  GENERAL:critically ill appearing,  EYES: Pupils equal, round, reactive to light.  No scleral icterus.  MOUTH: Moist mucosal membrane. NECK: Supple.  PULMONARY: cta bl CARDIOVASCULAR: S1 and S2.  No murmurs  GASTROINTESTINAL: Soft, nontender, +distended.  MUSCULOSKELETAL: No swelling, clubbing, or edema.  NEUROLOGIC: alert and awake SKIN reviewed pic      Labs/imaging that I havepersonally reviewed  (right click and "Reselect all SmartList Selections" daily)       ASSESSMENT AND PLAN SYNOPSIS   41 year old African-American female with severe malnutrition PAO with history of end-stage liver disease and liver cirrhosis due to alcohol abuse PCCM consulted for  severe hypothermia associated with renal failure  Recommend following DKA protocol and insulin drip Continue Bair hugger for hypothermia   CARDIAC ICU monitoring   ACUTE KIDNEY INJURY/Renal Failure -continue Foley Catheter-assess need -Avoid nephrotoxic agents -Follow urine output, BMP -Ensure adequate renal perfusion, optimize oxygenation -Renal dose medications Check urine drug screen    Intake/Output Summary (Last 24 hours) at 05/31/2022 1623 Last data filed at 05/23/2022 1616 Gross per 24 hour  Intake 1600 ml  Output --  Net 1600 ml     INFECTIOUS DISEASE Source may be skin lesions but less likley -continue antibiotics as prescribed -follow up cultures Check HIV status  ENDO - ICU hypoglycemic\Hyperglycemia protocol -check FSBS per protocol   GI-liver cirrhosis due to alcohol GI PROPHYLAXIS as indicated GI consultation placed  NUTRITIONAL STATUS DIET--> as tolerated Constipation protocol as indicated   ELECTROLYTES -follow labs as needed -replace as needed -pharmacy consultation and following   ACUTE ANEMIA- TRANSFUSE AS NEEDED CONSIDER TRANSFUSION  IF HGB<7      Best practice (right click and "Reselect all SmartList Selections" daily)  Diet:REGULAR Mobility:  bed rest  Code Status:  FULL Disposition:ICU  Labs   CBC: Recent Labs  Lab 05/05/2022 1158  WBC 7.5  NEUTROABS 5.4  HGB 7.7*  HCT 23.6*  MCV 81.7  PLT 706    Basic Metabolic Panel: Recent Labs  Lab 05/12/2022 1158 05/09/2022 1450  NA 133*  --   K 3.1*  --   CL 101  --   CO2 17*  --   GLUCOSE 386*  --   BUN 16  --   CREATININE 2.04*  --   CALCIUM 7.5*  --   MG  --  1.4*   GFR: Estimated Creatinine Clearance: 37 mL/min (A) (by C-G formula based on SCr of 2.04 mg/dL (H)). Recent Labs  Lab 05/20/2022 1158 05/31/2022 1450  WBC 7.5  --   LATICACIDVEN  --  5.7*    Liver Function Tests: Recent Labs  Lab 05/07/2022 1158  AST 36  ALT 32  ALKPHOS 473*  BILITOT 1.7*   PROT 5.0*  ALBUMIN <1.5*   Recent Labs  Lab 05/11/2022 1158  LIPASE 16   Recent Labs  Lab 05/23/2022 1158  AMMONIA 95*    ABG No results found for: "PHART", "PCO2ART", "PO2ART", "HCO3", "TCO2", "ACIDBASEDEF", "O2SAT"   Coagulation Profile: Recent Labs  Lab 05/24/2022 1231  INR 1.5*    Cardiac Enzymes: Recent Labs  Lab 05/08/2022 1158  CKTOTAL 66    HbA1C: Hgb A1c MFr Bld  Date/Time Value Ref Range Status  04/29/2022 10:50 AM 6.3 (H) 4.8 - 5.6 % Final    Comment:    (NOTE) Pre diabetes:          5.7%-6.4%  Diabetes:              >6.4%  Glycemic control for   <7.0% adults with diabetes   04/04/2022 07:26 AM 6.8 (H) 4.8 - 5.6 % Final    Comment:    (NOTE)         Prediabetes: 5.7 - 6.4         Diabetes: >6.4         Glycemic control for adults with diabetes: <7.0     CBG: No results for input(s): "GLUCAP" in the last 168 hours.   Past Medical History:  She,  has a past medical history of ADHD, Anxiety, Cholelithiasis, Depression, ETOH abuse, History of Diabetes ([place]), Hypertension, Insomnia, Low back pain, Morbid obesity ([place]), and Pancreatitis.   Surgical History:   Past Surgical History:  Procedure Laterality Date   BACK SURGERY  02/24/2011   a. L4-L5 ([place])   CHOLECYSTECTOMY  12/26/2012   GASTRIC BYPASS  06/15/2007   HERNIA REPAIR  12/06/2012   LUMBAR LAMINECTOMY  03/07/2011     Social History:   reports that she has never smoked. She has never used smokeless tobacco. She reports that she does not currently use alcohol. She reports that she does not use drugs.   Family History:  Her family history includes Alcohol abuse in her mother; Asthma in her mother; Diabetes in her mother; Drug abuse in her mother; Healthy in her father; Heart failure in her mother; Hypertension in her mother.   Allergies Allergies  Allergen Reactions   Peanut-Containing Drug Products Anaphylaxis   Amoxicillin Other (See Comments)    Gi distress   Citrus Rash      Home Medications  Prior to Admission medications   Medication Sig Start Date End Date Taking? Authorizing Provider  acetaminophen (TYLENOL) 500 MG tablet Take 2 tablets by mouth 4 (four) times daily as needed. 09/07/18   [provider]  albuterol (  VENTOLIN HFA) 108 (90 Base) MCG/ACT inhaler Inhale 1 puff into the lungs daily as needed. 10/08/21   [provider]  amphetamine-dextroamphetamine (ADDERALL) 20 MG tablet Take 20 mg by mouth 3 (three) times daily.    [provider]  ascorbic acid (VITAMIN C) 500 MG tablet Take 1 tablet (500 mg total) by mouth 2 (two) times daily. 04/06/22   Fritzi Mandes, MD  aspirin EC 81 MG tablet Take 1 tablet (81 mg total) by mouth daily. Swallow whole. 05/01/22   Lorella Nimrod, MD  blood glucose meter kit and supplies KIT Dispense based on patient and insurance preference. Use up to four times daily as directed. (FOR ICD-9 250.00, 250.01). 12/30/18   Bettey Costa, MD  clonazePAM (KLONOPIN) 0.5 MG disintegrating tablet Take 0.5-1 tablets by mouth 2 (two) times daily. 1MG-AM, 0.5MG-PM 09/19/17   [provider]  desvenlafaxine (PRISTIQ) 50 MG 24 hr tablet Take 50 mg by mouth 2 (two) times daily. 04/21/22   [provider]  feeding supplement (ENSURE ENLIVE / ENSURE PLUS) LIQD Take 237 mLs by mouth 3 (three) times daily between meals. 04/06/22   Fritzi Mandes, MD  furosemide (LASIX) 20 MG tablet Take 1 tablet (20 mg total) by mouth daily. 04/07/22   Fritzi Mandes, MD  gabapentin (NEURONTIN) 300 MG capsule Take 300 mg by mouth 2 (two) times daily. Take one capsule twice a day for peripheral neuropathy    [provider]  lipase/protease/amylase (CREON) 36000 UNITS CPEP capsule Take 2 capsules (72,000 Units total) by mouth 3 (three) times daily with meals. May also take 1 capsule (36,000 Units total) as needed (with snacks). 05/01/22   Lorella Nimrod, MD  lithium carbonate 150 MG capsule Take 150 mg by mouth daily. 04/21/22   [provider]  Multiple Vitamin (MULTIVITAMIN WITH MINERALS) TABS tablet Take 1 tablet by mouth daily. 04/07/22   Fritzi Mandes, MD  naltrexone (DEPADE) 50 MG tablet Take 1 tablet (50 mg total) by mouth daily. 12/31/18   Bettey Costa, MD  rosuvastatin (CRESTOR) 5 MG tablet Take 1 tablet (5 mg total) by mouth daily. 05/01/22   Lorella Nimrod, MD  spironolactone (ALDACTONE) 25 MG tablet Take 1 tablet (25 mg total) by mouth daily. 05/01/22   Lorella Nimrod, MD  traMADol (ULTRAM) 50 MG tablet Take 1 tablet (50 mg total) by mouth every 12 (twelve) hours as needed for moderate pain. 05/01/22   Lorella Nimrod, MD  traZODone (DESYREL) 50 MG tablet Take 50 mg by mouth at bedtime. 02/16/22   [provider]  Vitamin D, Ergocalciferol, (DRISDOL) 1.25 MG (50000 UNIT) CAPS capsule Take 50,000 Units by mouth once a week. 04/14/22   [provider]       PATIENT WITH VERY POOR PROGNOSIS RECOMMEND PALLIATIVE CARE CONSULTATION PCCM to follow-up in consultation No further recommendations at this time   Corrin Parker, M.D.  [facility]  Medical Director [place] Director [facility] Cardio-Pulmonary Department

## 2022-05-22 NOTE — ED Notes (Signed)
[facility] transfer . They are on diversion and closed for medical transfers.
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
## 2022-05-23 ENCOUNTER — Inpatient Hospital Stay (HOSPITAL_COMMUNITY)
Admit: 2022-05-23 | Discharge: 2022-05-23 | Disposition: A | Discharge status: Home / Self Care | Payer: BC Managed Care – PPO | Attending: Pulmonary Disease | Admitting: Pulmonary Disease

## 2022-05-23 ENCOUNTER — Inpatient Hospital Stay: Payer: BC Managed Care – PPO

## 2022-05-23 DIAGNOSIS — G9340 Encephalopathy, unspecified: Secondary | ICD-10-CM

## 2022-05-23 DIAGNOSIS — E43 Unspecified severe protein-calorie malnutrition: Secondary | ICD-10-CM | POA: Diagnosis not present

## 2022-05-23 DIAGNOSIS — K7210 Chronic hepatic failure without coma: Secondary | ICD-10-CM

## 2022-05-23 DIAGNOSIS — I428 Other cardiomyopathies: Secondary | ICD-10-CM

## 2022-05-23 DIAGNOSIS — K861 Other chronic pancreatitis: Secondary | ICD-10-CM

## 2022-05-23 DIAGNOSIS — K7031 Alcoholic cirrhosis of liver with ascites: Principal | ICD-10-CM

## 2022-05-23 DIAGNOSIS — K7030 Alcoholic cirrhosis of liver without ascites: Secondary | ICD-10-CM | POA: Diagnosis not present

## 2022-05-23 DIAGNOSIS — R6510 Systemic inflammatory response syndrome (SIRS) of non-infectious origin without acute organ dysfunction: Secondary | ICD-10-CM | POA: Diagnosis not present

## 2022-05-23 DIAGNOSIS — N179 Acute kidney failure, unspecified: Secondary | ICD-10-CM | POA: Diagnosis not present

## 2022-05-23 DIAGNOSIS — E872 Acidosis, unspecified: Secondary | ICD-10-CM

## 2022-05-23 DIAGNOSIS — R7402 Elevation of levels of lactic acid dehydrogenase (LDH): Secondary | ICD-10-CM

## 2022-05-23 DIAGNOSIS — R6511 Systemic inflammatory response syndrome (SIRS) of non-infectious origin with acute organ dysfunction: Secondary | ICD-10-CM

## 2022-05-23 DIAGNOSIS — R739 Hyperglycemia, unspecified: Secondary | ICD-10-CM

## 2022-05-23 DIAGNOSIS — E876 Hypokalemia: Secondary | ICD-10-CM

## 2022-05-23 LAB — ECHOCARDIOGRAM COMPLETE
AR max vel: 3.6 cm2
AV Peak grad: 3.3 mmHg
Ao pk vel: 0.91 m/s
Area-P 1/2: 4.8 cm2
Calc EF: 64.2 %
Height: 68 in
S' Lateral: 2.4 cm
Single Plane A2C EF: 66.9 %
Single Plane A4C EF: 62.3 %
Weight: 2448 oz

## 2022-05-23 LAB — HEPATITIS PANEL, ACUTE
HCV Ab: NONREACTIVE
Hep A IgM: NONREACTIVE
Hep B C IgM: NONREACTIVE
Hepatitis B Surface Ag: NONREACTIVE

## 2022-05-23 LAB — BASIC METABOLIC PANEL
Anion gap: 12 (ref 5–15)
Anion gap: 13 (ref 5–15)
BUN: 15 mg/dL (ref 6–20)
BUN: 16 mg/dL (ref 6–20)
CO2: 18 mmol/L — ABNORMAL LOW (ref 22–32)
CO2: 18 mmol/L — ABNORMAL LOW (ref 22–32)
Calcium: 7.7 mg/dL — ABNORMAL LOW (ref 8.9–10.3)
Calcium: 8.2 mg/dL — ABNORMAL LOW (ref 8.9–10.3)
Chloride: 109 mmol/L (ref 98–111)
Chloride: 106 mmol/L (ref 98–111)
Creatinine, Ser: 1.93 mg/dL — ABNORMAL HIGH (ref 0.44–1.00)
Creatinine, Ser: 1.84 mg/dL — ABNORMAL HIGH (ref 0.44–1.00)
GFR, Estimated: 33 mL/min — ABNORMAL LOW (ref >60)
GFR, Estimated: 35 mL/min — ABNORMAL LOW (ref >60)
Glucose, Bld: 174 mg/dL — ABNORMAL HIGH (ref 70–99)
Glucose, Bld: 147 mg/dL — ABNORMAL HIGH (ref 70–99)
Potassium: 3 mmol/L — ABNORMAL LOW (ref 3.5–5.1)
Potassium: 3.3 mmol/L — ABNORMAL LOW (ref 3.5–5.1)
Sodium: 139 mmol/L (ref 135–145)
Sodium: 137 mmol/L (ref 135–145)

## 2022-05-23 LAB — CBC WITH DIFFERENTIAL/PLATELET
Abs Immature Granulocytes: 0.02 10*3/uL (ref 0.00–0.07)
Basophils Absolute: 0 10*3/uL (ref 0.0–0.1)
Basophils Relative: 0 %
Eosinophils Absolute: 0 10*3/uL (ref 0.0–0.5)
Eosinophils Relative: 0 %
HCT: 19.7 % — ABNORMAL LOW (ref 36.0–46.0)
Hemoglobin: 6.7 g/dL — ABNORMAL LOW (ref 12.0–15.0)
Immature Granulocytes: 0 %
Lymphocytes Relative: 20 %
Lymphs Abs: 1.6 10*3/uL (ref 0.7–4.0)
MCH: 27.3 pg (ref 26.0–34.0)
MCHC: 34 g/dL (ref 30.0–36.0)
MCV: 80.4 fL (ref 80.0–100.0)
Monocytes Absolute: 0.3 10*3/uL (ref 0.1–1.0)
Monocytes Relative: 3 %
Neutro Abs: 6.2 10*3/uL (ref 1.7–7.7)
Neutrophils Relative %: 77 %
Platelets: 110 10*3/uL — ABNORMAL LOW (ref 150–400)
RBC: 2.45 MIL/uL — ABNORMAL LOW (ref 3.87–5.11)
RDW: 16 % — ABNORMAL HIGH (ref 11.5–15.5)
WBC: 8.2 10*3/uL (ref 4.0–10.5)
nRBC: 0 % (ref 0.0–0.2)

## 2022-05-23 LAB — BLOOD GAS, VENOUS
Acid-base deficit: 7.7 mmol/L — ABNORMAL HIGH (ref 0.0–2.0)
Bicarbonate: 16.6 mmol/L — ABNORMAL LOW (ref 20.0–28.0)
O2 Saturation: 93.5 %
Patient temperature: 37
pCO2, Ven: 30 mmHg — ABNORMAL LOW (ref 44–60)
pH, Ven: 7.35 (ref 7.25–7.43)
pO2, Ven: 69 mmHg — ABNORMAL HIGH (ref 32–45)

## 2022-05-23 LAB — HIV ANTIBODY (ROUTINE TESTING W REFLEX): HIV Screen 4th Generation wRfx: NONREACTIVE

## 2022-05-23 LAB — PREPARE RBC (CROSSMATCH)

## 2022-05-23 LAB — APTT: aPTT: 56 seconds — ABNORMAL HIGH (ref 24–36)

## 2022-05-23 LAB — HEPATIC FUNCTION PANEL
ALT: 21 U/L (ref 0–44)
AST: 32 U/L (ref 15–41)
Albumin: 2.2 g/dL — ABNORMAL LOW (ref 3.5–5.0)
Alkaline Phosphatase: 317 U/L — ABNORMAL HIGH (ref 38–126)
Bilirubin, Direct: 0.8 mg/dL — ABNORMAL HIGH (ref 0.0–0.2)
Indirect Bilirubin: 0.9 mg/dL (ref 0.3–0.9)
Total Bilirubin: 1.7 mg/dL — ABNORMAL HIGH (ref 0.3–1.2)
Total Protein: 4.7 g/dL — ABNORMAL LOW (ref 6.5–8.1)

## 2022-05-23 LAB — GLUCOSE, CAPILLARY
Glucose-Capillary: 123 mg/dL — ABNORMAL HIGH (ref 70–99)
Glucose-Capillary: 130 mg/dL — ABNORMAL HIGH (ref 70–99)
Glucose-Capillary: 135 mg/dL — ABNORMAL HIGH (ref 70–99)
Glucose-Capillary: 143 mg/dL — ABNORMAL HIGH (ref 70–99)
Glucose-Capillary: 152 mg/dL — ABNORMAL HIGH (ref 70–99)
Glucose-Capillary: 153 mg/dL — ABNORMAL HIGH (ref 70–99)
Glucose-Capillary: 161 mg/dL — ABNORMAL HIGH (ref 70–99)
Glucose-Capillary: 265 mg/dL — ABNORMAL HIGH (ref 70–99)

## 2022-05-23 LAB — HEMOGLOBIN AND HEMATOCRIT, BLOOD
HCT: 21.5 % — ABNORMAL LOW (ref 36.0–46.0)
HCT: 22.7 % — ABNORMAL LOW (ref 36.0–46.0)
Hemoglobin: 7.3 g/dL — ABNORMAL LOW (ref 12.0–15.0)
Hemoglobin: 7.8 g/dL — ABNORMAL LOW (ref 12.0–15.0)

## 2022-05-23 LAB — LACTIC ACID, PLASMA
Lactic Acid, Venous: 6.6 mmol/L (ref 0.5–1.9)
Lactic Acid, Venous: 3.9 mmol/L (ref 0.5–1.9)

## 2022-05-23 LAB — AMMONIA: Ammonia: 66 umol/L — ABNORMAL HIGH (ref 9–35)

## 2022-05-23 LAB — CK: Total CK: 26 U/L — ABNORMAL LOW (ref 38–234)

## 2022-05-23 LAB — PROTIME-INR
INR: 2.1 — ABNORMAL HIGH (ref 0.8–1.2)
Prothrombin Time: 23.2 seconds — ABNORMAL HIGH (ref 11.4–15.2)

## 2022-05-23 LAB — PHOSPHORUS: Phosphorus: 4.5 mg/dL (ref 2.5–4.6)

## 2022-05-23 LAB — MAGNESIUM: Magnesium: 2.7 mg/dL — ABNORMAL HIGH (ref 1.7–2.4)

## 2022-05-23 LAB — BRAIN NATRIURETIC PEPTIDE: B Natriuretic Peptide: 247.1 pg/mL — ABNORMAL HIGH (ref 0.0–100.0)

## 2022-05-23 MED ORDER — POTASSIUM CHLORIDE 10 MEQ/100ML IV SOLN
10.0000 meq | INTRAVENOUS | Status: AC
  Administered 2022-05-23 – 2022-05-24 (×2): 10 meq via INTRAVENOUS
  Filled 2022-05-23 (×2): qty 100

## 2022-05-23 MED ORDER — ASCORBIC ACID 500 MG PO TABS
500.0000 mg | ORAL_TABLET | Freq: Two times a day (BID) | ORAL | Status: DC
  Administered 2022-05-23 – 2022-05-24 (×2): 500 mg via ORAL
  Filled 2022-05-23 (×3): qty 1

## 2022-05-23 MED ORDER — ALBUMIN HUMAN 25 % IV SOLN
25.0000 g | Freq: Once | INTRAVENOUS | Status: AC
  Administered 2022-05-23: 25 g via INTRAVENOUS
  Filled 2022-05-23: qty 100

## 2022-05-23 MED ORDER — CHLORHEXIDINE GLUCONATE CLOTH 2 % EX PADS
6.0000 | MEDICATED_PAD | Freq: Every day | CUTANEOUS | Status: DC
  Administered 2022-05-23 – 2022-05-25 (×3): 6 via TOPICAL

## 2022-05-23 MED ORDER — RIFAXIMIN 550 MG PO TABS
550.0000 mg | ORAL_TABLET | Freq: Two times a day (BID) | ORAL | Status: DC
  Administered 2022-05-23 – 2022-05-24 (×2): 550 mg via ORAL
  Filled 2022-05-23 (×3): qty 1

## 2022-05-23 MED ORDER — ALBUMIN HUMAN 25 % IV SOLN
75.0000 g | Freq: Once | INTRAVENOUS | Status: AC
  Administered 2022-05-23: 75 g via INTRAVENOUS
  Filled 2022-05-23: qty 300

## 2022-05-23 MED ORDER — LIP MEDEX EX OINT
TOPICAL_OINTMENT | CUTANEOUS | Status: DC | PRN
  Filled 2022-05-23 (×2): qty 7

## 2022-05-23 MED ORDER — TRAMADOL HCL 50 MG PO TABS
50.0000 mg | ORAL_TABLET | Freq: Two times a day (BID) | ORAL | Status: DC | PRN
  Administered 2022-05-24: 50 mg via ORAL
  Filled 2022-05-23: qty 1

## 2022-05-23 MED ORDER — ALBUMIN HUMAN 25 % IV SOLN: 25.0000 g | Freq: Four times a day (QID) | INTRAVENOUS | Status: DC

## 2022-05-23 MED ORDER — LACTULOSE 10 GM/15ML PO SOLN
30.0000 g | Freq: Three times a day (TID) | ORAL | Status: DC
  Administered 2022-05-23 – 2022-05-24 (×3): 30 g via ORAL
  Filled 2022-05-23 (×4): qty 60

## 2022-05-23 MED ORDER — VANCOMYCIN HCL IN DEXTROSE 1-5 GM/200ML-% IV SOLN
1000.0000 mg | INTRAVENOUS | Status: DC
  Administered 2022-05-23 – 2022-05-25 (×3): 1000 mg via INTRAVENOUS
  Filled 2022-05-23 (×3): qty 200

## 2022-05-23 MED ORDER — ENSURE MAX PROTEIN PO LIQD
11.0000 [oz_av] | Freq: Two times a day (BID) | ORAL | Status: DC
  Administered 2022-05-24: 11 [oz_av] via ORAL

## 2022-05-23 MED ORDER — LITHIUM CARBONATE 150 MG PO CAPS
150.0000 mg | ORAL_CAPSULE | Freq: Every evening | ORAL | Status: DC
  Administered 2022-05-24: 150 mg via ORAL
  Filled 2022-05-23: qty 1

## 2022-05-23 MED ORDER — BLISTEX MEDICATED EX OINT
TOPICAL_OINTMENT | CUTANEOUS | Status: DC | PRN
  Filled 2022-05-23: qty 6.3

## 2022-05-23 MED ORDER — POTASSIUM CHLORIDE 10 MEQ/100ML IV SOLN
10.0000 meq | INTRAVENOUS | Status: AC
  Administered 2022-05-23 (×3): 10 meq via INTRAVENOUS
  Filled 2022-05-23 (×3): qty 100

## 2022-05-23 MED ORDER — POTASSIUM CHLORIDE 10 MEQ/100ML IV SOLN
10.0000 meq | INTRAVENOUS | Status: AC
  Administered 2022-05-23 (×6): 10 meq via INTRAVENOUS
  Filled 2022-05-23 (×6): qty 100

## 2022-05-23 MED ORDER — ALBUMIN HUMAN 25 % IV SOLN
75.0000 g | Freq: Once | INTRAVENOUS | Status: AC
  Administered 2022-05-24: 75 g via INTRAVENOUS
  Filled 2022-05-23: qty 300

## 2022-05-23 MED ORDER — ADULT MULTIVITAMIN W/MINERALS CH: 1.0000 | ORAL_TABLET | Freq: Every day | ORAL | Status: DC

## 2022-05-23 NOTE — Consult Note (Addendum)
NAME:  Stephanie Levy, MRN:  [id number], DOB:  06-24-1981, LOS: 1 ADMISSION DATE:  06/02/2022 CONSULTATION DATE:  05/23/2022  REFERRING MD:  Ellender Hose CHIEF COMPLAINT:  low temp and cant walk  BRIEF SYNOPSIS 42 year old African-American female with severe malnutrition with history of end-stage liver disease and liver cirrhosis due to alcohol abuse PCCM consulted for severe hypothermia   h/o end stage liver cirrhosis, alcohol dependence  (stopped drinking 5 mo ago),   Presented with bilateral lower extremity weakness and leg pain  Progressive weakness over the last 1 week and inability to walk  Patient has been losing significant amount of weight  Previously admitted for rash and wounds several weeks but likely venous stasis and swelling and dry skin  Signs and symptoms of orthostasis with dizziness with standing  No chest pain no shortness of breath Patient states increased appetite asking for food Temperature was noted to be 91 Patient placed on a Bair hugger  Pertinent labs in the ER Potassium 3.1 bicarb 17 blood sugar 386 Findings consistent with DKA with elevated anion gap Alkaline phos elevated at 473 Albumin less than 1.5 Ammonia level 95 T. bili 1.7 Hemoglobin 7.7 Platelet count 155 INR 1.5 TSH 5.6   EVENTS OVERNIGHT LA elevated BP stable TEMP WNL Given blood transfusions Creatinine worsening GI consult pending Consider Renal consultation   BP 104/77   Pulse 93   Temp 98.4 F (36.9 C)   Resp 10   Ht 5\' 8"  (1.727 m)   Wt 69.4 kg   LMP 07/30/2020   SpO2 98%   BMI 23.26 kg/m      Latest Ref Rng & Units 05/23/2022    4:55 AM 05/09/2022    7:42 PM 05/28/2022   11:58 AM  BMP  Glucose 70 - 99 mg/dL 174  324  386   BUN 6 - 20 mg/dL 15  13  16    Creatinine 0.44 - 1.00 mg/dL 1.93  1.55  2.04   Sodium 135 - 145 mmol/L 139  135  133   Potassium 3.5 - 5.1 mmol/L 3.0  2.7  3.1   Chloride 98 - 111 mmol/L 109  108  101   CO2 22 - 32 mmol/L 18  16  17    Calcium  8.9 - 10.3 mg/dL 7.7  6.2  7.5      Pertinent  Medical History     Significant Hospital Events: Including procedures, antibiotic start and stop dates in addition to other pertinent events   8/19 admitted to [place] under [place] for DKA and hypothermia 8/20 remains sick, temp better, sugars better     Micro Data:  Blood cultures pending COVID test negative  Antimicrobials:   Antibiotics Given (last 72 hours)     Date/Time Action Medication Dose Rate   05/11/2022 1255 New Bag/Given   ceFEPIme (MAXIPIME) 2 g in sodium chloride 0.9 % 100 mL IVPB 2 g 200 mL/hr   05/14/2022 1346 New Bag/Given   vancomycin (VANCOREADY) IVPB 1500 mg/300 mL 1,500 mg 150 mL/hr   05/23/22 0233 New Bag/Given   ceFEPIme (MAXIPIME) 2 g in sodium chloride 0.9 % 100 mL IVPB 2 g 200 mL/hr        GI consultation with [facility] 05/11/2022 consultation & management of severe protein calorie malnutrition.  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  Patient  was recently admitted to [facility] on 05/01/2022, GI was consulted for new diagnosis of cirrhosis and chronic diarrhea, severe protein calorie malnutrition and skin lesions in bilateral lower extremities with suspicion for vasculitis or pyoderma gangrenosum.  Patient underwent skin biopsy which was negative for vasculitis or pyoderma gangrenosum and consistent with stasis dermatitis.  During inpatient,  started her on Creon 72 K capsules with each meal and 36 K with snack.    Objective   Blood pressure 104/77, pulse 93, temperature 98.4 F (36.9 C), resp. rate 10, height 5\' 8"  (1.727 m), weight 69.4 kg, last menstrual period 07/30/2020, SpO2 98 %.        Intake/Output Summary (Last 24 hours) at 05/23/2022 0723 Last data filed at 05/23/2022 05/15/2022 Gross per 24 hour  Intake 2670.02 ml  Output --   Net 2670.02 ml    Filed Weights   05/28/2022 1151  Weight: 69.4 kg     Review of Systems:  Gen:+weight loss  HEENT: Small ulcers on the lips No chest pain SOB Other:  All other systems negative      PHYSICAL EXAMINATION:  GENERAL:critically ill appearing, cachectic EYES: Pupils equal, round, reactive to light.   MOUTH: Moist mucosal membrane.  NECK: Supple.  PULMONARY: +rhonchi,  CARDIOVASCULAR: S1 and S2.  No murmurs  GASTROINTESTINAL: less distended NEUROLOGIC: alert and awake       Labs/imaging that I havepersonally reviewed  (right click and "Reselect all SmartList Selections" daily)       ASSESSMENT AND PLAN SYNOPSIS   41 year old African-American female with severe malnutrition PAO with history of end-stage liver disease and liver cirrhosis due to alcohol abuse PCCM consulted for severe hypothermia associated with renal failure  ACIDOSIS AND ELEVATED LA CONTINUE IVF's   CARDIAC ICU monitoring  ACUTE KIDNEY INJURY/Renal Failure -continue Foley Catheter-assess need -Avoid nephrotoxic agents -Follow urine output, BMP -Ensure adequate renal perfusion, optimize oxygenation -Renal dose medications Consider Nephrology consultation   Intake/Output Summary (Last 24 hours) at 05/23/2022 0727 Last data filed at 05/23/2022 05/21/2022 Gross per 24 hour  Intake 2670.02 ml  Output --  Net 2670.02 ml     INFECTIOUS DISEASE Source may be skin lesions but less likley -continue antibiotics as prescribed -follow up cultures HIV status NEG   ENDO - ICU hypoglycemic\Hyperglycemia protocol -check FSBS per protocol   GI GI PROPHYLAXIS as indicated  NUTRITIONAL STATUS DIET-->as tolerated Constipation protocol as indicated   ELECTROLYTES -follow labs as needed -replace as needed -pharmacy consultation and following   ACUTE ANEMIA- TRANSFUSE AS NEEDED CONSIDER TRANSFUSION  IF HGB<7      Best practice (right click and "Reselect all  SmartList Selections" daily)  Diet:REGULAR Mobility:  bed rest  Code Status:  FULL Disposition:ICU  Labs   CBC: Recent Labs  Lab 05/28/22 1158 05-28-2022 2036 05/23/22 0455  WBC 7.5 6.8 8.2  NEUTROABS 5.4  --  6.2  HGB 7.7* 6.3* 6.7*  HCT 23.6* 18.1* 19.7*  MCV 81.7 78.7* 80.4  PLT 155 111* 110*     Basic Metabolic Panel: Recent Labs  Lab 05-28-2022 1158 05/28/22 1450 May 28, 2022 1942 05/23/22 0455  NA 133*  --  135 139  K 3.1*  --  2.7* 3.0*  CL 101  --  108 109  CO2 17*  --  16* 18*  GLUCOSE 386*  --  324* 174*  BUN 16  --  13 15  CREATININE 2.04*  --  1.55* 1.93*  CALCIUM 7.5*  --  6.2* 7.7*  MG  --  1.4* 1.6* 2.7*  PHOS  --   --  3.7 4.5    GFR: Estimated Creatinine Clearance: 39.1 mL/min (A) (by C-G formula based on SCr of 1.93 mg/dL (H)). Recent Labs  Lab 06/07/2022 1158 2022-06-07 1450 06-07-22 1749 06-07-22 2036 05/23/22 0455  WBC 7.5  --   --  6.8 8.2  LATICACIDVEN  --  5.7* 4.7* 4.3* 6.6*     Liver Function Tests: Recent Labs  Lab 07-Jun-2022 1158 05/23/22 0455  AST 36 32  ALT 32 21  ALKPHOS 473* 317*  BILITOT 1.7* 1.7*  PROT 5.0* 4.7*  ALBUMIN <1.5* 2.2*    Recent Labs  Lab 2022-06-07 1158  LIPASE 16    Recent Labs  Lab 06/07/22 1158 05/23/22 0455  AMMONIA 95* 66*     ABG    Component Value Date/Time   HCO3 16.6 (L) 05/23/2022 0455   ACIDBASEDEF 7.7 (H) 05/23/2022 0455   O2SAT 93.5 05/23/2022 0455     Coagulation Profile: Recent Labs  Lab 2022/06/07 1231 05/23/22 0455  INR 1.5* 2.1*     Cardiac Enzymes: Recent Labs  Lab 06/07/2022 1158  CKTOTAL 66     HbA1C: Hgb A1c MFr Bld  Date/Time Value Ref Range Status  04/29/2022 10:50 AM 6.3 (H) 4.8 - 5.6 % Final    Comment:    (NOTE) Pre diabetes:          5.7%-6.4%  Diabetes:              >6.4%  Glycemic control for   <7.0% adults with diabetes   04/04/2022 07:26 AM 6.8 (H) 4.8 - 5.6 % Final    Comment:    (NOTE)         Prediabetes: 5.7 - 6.4         Diabetes:  >6.4         Glycemic control for adults with diabetes: <7.0     CBG: Recent Labs  Lab 2022/06/07 2015 06-07-2022 2142 05/23/22 0047 05/23/22 0525  GLUCAP 365* 350* 265* 161*     DVT/GI PRX  assessed I Assessed the need for Labs I Assessed the need for Foley I Assessed the need for Central Venous Line Family Discussion when available I Assessed the need for Mobilization I made an Assessment of medications to be adjusted accordingly Safety Risk assessment completed  CASE DISCUSSED IN MULTIDISCIPLINARY ROUNDS WITH ICU TEAM   RECOMMEND PALLIATIVE CARE CONSULTATION  Critical Care Time devoted to patient care services described in this note is 50  minutes.    Lucie Leather, M.D.  [facility]  Medical Director [facility] Medical Director [facility] Cardio-Pulmonary Department

## 2022-05-23 NOTE — Progress Notes (Signed)
Bair hugger place back on patient.   05/23/22 1900  Vitals  Temp (!) 96.6 F (35.9 C)  Temp Source Rectal  BP 96/71  MAP (mmHg) 80  BP Location Right Arm  BP Method Automatic  Patient Position (if appropriate) Lying  Pulse Rate 90  Pulse Rate Source Monitor  ECG Heart Rate 88  Resp 11  Level of Consciousness  Level of Consciousness Responds to Voice  Oxygen Therapy  O2 Device Room Air  Pain Assessment  Pain Scale CPOT  Pain Score Asleep  Critical Care Pain Observation Tool (CPOT)  Facial Expression 0  Body Movements 0  Muscle Tension 0  Compliance with ventilator (intubated pts.) N/A  Vocalization (extubated pts.) 0  CPOT Total 0  MEWS Score  MEWS Temp 1  MEWS Systolic 1  MEWS Pulse 0  MEWS RR 1  MEWS LOC 1  MEWS Score 4  MEWS Score Color Red
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
## 2022-05-23 NOTE — Progress Notes (Signed)
PROGRESS NOTE    Stephanie Levy  DUK:[id number] DOB: 1981-09-18 DOA: 2022/05/30 PCP: Katrinka Blazing, MD  Outpatient Specialists: GI, wound care    Brief Narrative:  From admission h and p Stephanie Levy is a 41 y.o. female with medical history significant of AUD (in remission), alcoholic cirrhosis, chronic venous insufficiency with wounds, obesity s/p Gastric bypass surgery, MDD, HTN, malnutrition, ADHD who presents for worsening pain in the legs, weeping leg wounds and difficulty standing.  Stephanie Levy reports that since leaving the hospital in late July, she has had a slow decline in strength.  She notes that her legs will not work to move her from a sitting to standing position, however, once she is up, she can ambulate okay.  She feels that her thighs are weak and she concerned with falling.  She has persistent lesions on the legs which can be seen in photos in care everywhere.  She feels like they are worsening.  She has had weeping of clear fluids and pain.  She has gained weight up from 120# in May of this year and notes she is 153# today.  She is taking her diuretics and notes that she was recently increased in her lasix and spironolactone to 40mg  and 50mg  daily, but she feels her abdomen is actually bigger.  She has had no fever, chills, no nausea or vomiting.  She feels that she is eating okay at home.  She notes no change in mentation, no confusion, no pain with urination, no abdominal pain.    Assessment & Plan:   Principal Problem:   SIRS (systemic inflammatory response syndrome) (HCC) Active Problems:   Skin lesions   Alcohol abuse   HTN (hypertension)   Alcoholic cirrhosis of liver with ascites (HCC)   Depression with anxiety   ADHD   Diabetes mellitus without complication (HCC)   Normocytic anemia   Pancreatic lesion   Protein-calorie malnutrition, severe   Elevated lactic acid level   Hypomagnesemia   Bilateral lower extremity edema   Pressure injury of skin   Chronic  pain syndrome   History of gastric bypass   Neuropathy  # Cirrhosis, end-stage # Ascites GI following, per them no clear-cut signs of decompensation. History alcoholism. No overt signs bleeding. Meldna 23 (14-15% 90-day mortality). U/s with patent portal vein, moderate ascites. GI following. No known varices but I do not see that she has ever had EGD. - holding home lasix/spiro - paracentesis to eval for sbp. Continue cefepime - continue lactulose and titrate to 2-3 bm/day - rifaximin started by GI  # Anemia Hgb 7.7>6.3>6.7>7.3, has received 2 units. No overt signs of bleeding - monitor and trend, transfuse as needed, GI is following - trend coags - pharmacologic dvt ppx on hold  # Critical illness In the setting of severe incurable chronic disease - palliative consulted  # SIRS # Elevated lactic acid Presents altered, hypothermic, with markedly elevated lactic acid. Has chronic lower extremity wounds but they do not appear infected. Covid negative.  - blood and urine cultures drawn and pending - check cxr - trend lactate, is improving - continue fluids and albumin - continue empiric vanc/cefepime  # Acute kidney injury Baseline normal kidney function. 2.04 on presentation, 1.93 this morning. Urinalysis unremarkable. Possible hepatorenal. - trend - fluids as above - renal u/s and nephrology consult if fails to improve - maintain foley  # Hyperglycemia Presented hyperglycemic, now much improved with q4 SSI - continue SSI  # Hypokalemia K 3.0 this  morning, trended as low as 2.7 yesterday. - replete and monitor  # Hypomagnesmia Normalized after repletion - monitor  # Encephalopathy CT head negative  # Lower extremity wounds Has been followed by wound care clinic. Etiology unclear, perhaps secondary to chronic edema. Biopsy last month indeterminate, no signs vasculitis - wound care consult  # Chronic pancreatitis with pancreatic insufficiency - cont home  creon  # Severe malnutrition - RD consult  # History alcoholism Reportedly no recent alcohol (stopped 02/2022) here no signs of withdrawal - monitor for signs of withdrawal  # ADHD - hold adderall  # MDD Lithium level undetectable - cont home lithium      DVT prophylaxis: SCDs Code Status: full Family Communication: spouse Stephanie Levy updated telephonically 8/20  Level of care: Stepdown Status is: Inpatient Remains inpatient appropriate because: severity of illness    Consultants:  PCCM, GI  Procedures: pending  Antimicrobials:  Vanc/cefepime    Subjective: This morning minimally responsive  Objective: Vitals:   05/23/22 0752 05/23/22 0800 05/23/22 0815 05/23/22 0830  BP:  102/76 99/78 94/82   Pulse: 96 90 85 81  Resp: 12 10 10  (!) 9  Temp: (!) 95.9 F (35.5 C) (!) 95.7 F (35.4 C) (!) 95.7 F (35.4 C) (!) 95.7 F (35.4 C)  TempSrc:      SpO2: 99% 99% 99% 97%  Weight:      Height:        Intake/Output Summary (Last 24 hours) at 05/23/2022 0951 Last data filed at 05/23/2022 0656 Gross per 24 hour  Intake 2670.02 ml  Output --  Net 2670.02 ml   Filed Weights   06/02/2022 1151  Weight: 69.4 kg    Examination:  General exam: asleep, chronically ill appearing Respiratory system: normal rate. Rales at bases Cardiovascular system: S1 & S2 heard, RRR. No JVD, murmurs, rubs, gallops or clicks.  Gastrointestinal system: Abdomen is mildly distended, soft and nontender.  Central nervous system: asleep Extremities: pitting edema to abdomen Skin:    Psychiatry: unable to assesss    Data Reviewed: I have personally reviewed following labs and imaging studies  CBC: Recent Labs  Lab 05/04/2022 1158 05/19/2022 2036 05/23/22 0455  WBC 7.5 6.8 8.2  NEUTROABS 5.4  --  6.2  HGB 7.7* 6.3* 6.7*  HCT 23.6* 18.1* 19.7*  MCV 81.7 78.7* 80.4  PLT 155 111* 110*   Basic Metabolic Panel: Recent Labs  Lab 05/05/2022 1158 05/19/2022 1450 05/15/2022 1942  05/23/22 0455  NA 133*  --  135 139  K 3.1*  --  2.7* 3.0*  CL 101  --  108 109  CO2 17*  --  16* 18*  GLUCOSE 386*  --  324* 174*  BUN 16  --  13 15  CREATININE 2.04*  --  1.55* 1.93*  CALCIUM 7.5*  --  6.2* 7.7*  MG  --  1.4* 1.6* 2.7*  PHOS  --   --  3.7 4.5   GFR: Estimated Creatinine Clearance: 39.1 mL/min (A) (by C-G formula based on SCr of 1.93 mg/dL (H)). Liver Function Tests: Recent Labs  Lab 05/31/2022 1158 05/23/22 0455  AST 36 32  ALT 32 21  ALKPHOS 473* 317*  BILITOT 1.7* 1.7*  PROT 5.0* 4.7*  ALBUMIN <1.5* 2.2*   Recent Labs  Lab 05/29/2022 1158  LIPASE 16   Recent Labs  Lab 05/13/2022 1158 05/23/22 0455  AMMONIA 95* 66*   Coagulation Profile: Recent Labs  Lab 05/09/2022 1231 05/23/22 0455  INR 1.5*  2.1*   Cardiac Enzymes: Recent Labs  Lab 2022-06-01 1158  CKTOTAL 66   BNP (last 3 results) No results for input(s): "PROBNP" in the last 8760 hours. HbA1C: No results for input(s): "HGBA1C" in the last 72 hours. CBG: Recent Labs  Lab 06-01-2022 2015 06-01-2022 2142 05/23/22 0047 05/23/22 0525 05/23/22 0757  GLUCAP 365* 350* 265* 161* 130*   Lipid Profile: No results for input(s): "CHOL", "HDL", "LDLCALC", "TRIG", "CHOLHDL", "LDLDIRECT" in the last 72 hours. Thyroid Function Tests: Recent Labs    06-01-2022 1158  TSH 5.651*  FREET4 0.67   Anemia Panel: No results for input(s): "VITAMINB12", "FOLATE", "FERRITIN", "TIBC", "IRON", "RETICCTPCT" in the last 72 hours. Urine analysis:    Component Value Date/Time   COLORURINE YELLOW (A) Jun 01, 2022 2036   APPEARANCEUR HAZY (A) 06/01/22 2036   LABSPEC 1.017 06/01/2022 2036   PHURINE 5.0 06/01/22 2036   GLUCOSEU 50 (A) 06-01-22 2036   HGBUR NEGATIVE 06-01-22 2036   BILIRUBINUR NEGATIVE 06-01-22 2036   KETONESUR NEGATIVE Jun 01, 2022 2036   PROTEINUR NEGATIVE 2022/06/01 2036   NITRITE NEGATIVE Jun 01, 2022 2036   LEUKOCYTESUR NEGATIVE 01-Jun-2022 2036   Sepsis  Labs: @LABRCNTIP (procalcitonin:4,lacticidven:4)  ) Recent Results (from the past 240 hour(s))  SARS Coronavirus 2 by RT PCR (hospital order, performed in [facility] hospital lab) *cepheid single result test* Anterior Nasal Swab     Status: None   Collection Time: 06/01/2022 12:08 PM   Specimen: Anterior Nasal Swab  Result Value Ref Range Status   SARS Coronavirus 2 by RT PCR NEGATIVE NEGATIVE Final    Comment: (NOTE) SARS-CoV-2 target nucleic acids are NOT DETECTED.  The SARS-CoV-2 RNA is generally detectable in upper and lower respiratory specimens during the acute phase of infection. The lowest concentration of SARS-CoV-2 viral copies this assay can detect is 250 copies / mL. A negative result does not preclude SARS-CoV-2 infection and should not be used as the sole basis for treatment or other patient management decisions.  A negative result may occur with improper specimen collection / handling, submission of specimen other than nasopharyngeal swab, presence of viral mutation(s) within the areas targeted by this assay, and inadequate number of viral copies (<250 copies / mL). A negative result must be combined with clinical observations, patient history, and epidemiological information.  Fact Sheet for Patients:   05/24/22  Fact Sheet for Healthcare Providers: RoadLapTop.co.za  This test is not yet approved or  cleared by the http://kim-miller.com/ FDA and has been authorized for detection and/or diagnosis of SARS-CoV-2 by FDA under an Emergency Use Authorization (EUA).  This EUA will remain in effect (meaning this test can be used) for the duration of the COVID-19 declaration under Section 564(b)(1) of the Act, 21 U.S.C. section 360bbb-3(b)(1), unless the authorization is terminated or revoked sooner.  Performed at [facility], [address] [place], [place]   Blood culture (routine x 2)     Status:  None (Preliminary result)   Collection Time: Jun 01, 2022 12:31 PM   Specimen: BLOOD  Result Value Ref Range Status   Specimen Description BLOOD BLOOD RIGHT FOREARM  Final   Special Requests   Final    BOTTLES DRAWN AEROBIC AND ANAEROBIC Blood Culture adequate volume   Culture   Final    NO GROWTH < 24 HOURS Performed at [facility], [address] [place], [place]    Report Status PENDING  Incomplete  Blood culture (routine x 2)     Status: None (Preliminary  result)   Collection Time: 16-Jun-2022 12:31 PM   Specimen: BLOOD  Result Value Ref Range Status   Specimen Description BLOOD BLOOD LEFT WRIST  Final   Special Requests   Final    BOTTLES DRAWN AEROBIC AND ANAEROBIC Blood Culture adequate volume   Culture   Final    NO GROWTH < 24 HOURS Performed at [facility], [address] [place], [place] [address]    Report Status PENDING  Incomplete         Radiology Studies: CT HEAD WO CONTRAST ( )  Result Date: Jun 16, 2022 CLINICAL DATA:  Head trauma, abnormal mental status.  Unknown cause. EXAM: CT HEAD WITHOUT CONTRAST TECHNIQUE: Contiguous axial images were obtained from the base of the skull through the vertex without intravenous contrast. RADIATION DOSE REDUCTION: This exam was performed according to the departmental dose-optimization program which includes automated exposure control, adjustment of the mA and/or kV according to patient size and/or use of iterative reconstruction technique. COMPARISON:  CT head dated October 13, 2017. MRI examination dated October 13, 2017. FINDINGS: Brain: No evidence of acute infarction, hemorrhage, hydrocephalus, extra-axial collection or mass lesion/mass effect. Vascular: No hyperdense vessel or unexpected calcification. Skull: Normal. Negative for fracture or focal lesion. Sinuses/Orbits: No acute finding. Other: None. IMPRESSION: No acute intracranial abnormality. Electronically Signed   By: Larose Hires D.O.   On:  2022/06/16 21:30        Scheduled Meds:  ascorbic acid  500 mg Per Tube BID   Chlorhexidine Gluconate Cloth  6 each Topical Daily   feeding supplement  237 mL Oral TID BM   insulin aspart  0-9 Units Subcutaneous Q4H   lactulose  30 g Per Tube TID   lipase/protease/amylase  36,000 Units Oral TID AC   lithium carbonate  150 mg Per Tube QPM   multivitamin with minerals  1 tablet Per Tube Daily   potassium chloride  40 mEq Per Tube Once   Ensure Max Protein  11 oz Oral BID   sodium chloride flush  3 mL Intravenous Q12H   Continuous Infusions:  sodium chloride 150 mL/hr at 05/23/22 0338   [START ON 05/24/2022] albumin human     ceFEPime (MAXIPIME) IV 2 g (05/23/22 0233)   potassium chloride 10 mEq (05/23/22 0945)   [START ON 05/24/2022] vancomycin       LOS: 1 day     Silvano Bilis, MD [facility]   If 7PM-7AM, please contact night-coverage [email or web address] Password [facility] 05/23/2022, 9:51 AM

## 2022-05-23 NOTE — Consult Note (Signed)
Pharmacy Antibiotic Note  Stephanie Levy is a 41 y.o. female admitted on 2022-05-29 with sepsis.  Pharmacy has been consulted for cefepime and vancomycin dosing.  8/19: Received Cefepime 2g IV x1 and Vancomycin 1500mg  IV x1 in ED  Plan: Continue Cefepime 2g IV every 12 hours (first dose 8/20 at 0100) Adjust Vancomycin to 1000mg  IV every 24 hours Goal AUC 400-550  Est AUC: 543.2 Est Cmax: 34.1 Est Cmin: 14.6 Calculated with SCr 1.93 mg/dL  Height: 5\' 8"  (172.7 cm) Weight: 69.4 kg (153 lb) IBW/kg (Calculated) : 63.9  Temp (24hrs), Avg:95.6 F (35.3 C), Min:91.7 F (33.2 C), Max:98.4 F (36.9 C)  Recent Labs  Lab 2022/05/29 1158 29-May-2022 1450 05-29-2022 1749 29-May-2022 1942 05-29-2022 2036 05/23/22 0455  WBC 7.5  --   --   --  6.8 8.2  CREATININE 2.04*  --   --  1.55*  --  1.93*  LATICACIDVEN  --  5.7* 4.7*  --  4.3* 6.6*     Estimated Creatinine Clearance: 39.1 mL/min (A) (by C-G formula based on SCr of 1.93 mg/dL (H)).    Allergies  Allergen Reactions   Peanut-Containing Drug Products Anaphylaxis   Amoxicillin Other (See Comments)    Gi distress   Citrus Rash    Antimicrobials this admission: 8/19 Cefepime >>  8/19 Vancomycin >>    Microbiology results: 8/19 BCx: NGTD 8/19 Ucx: collected   Thank you for allowing pharmacy to be a part of this patient's care.  Aylah Yeary A Corvin Sorbo 05/23/2022 10:06 AM

## 2022-05-23 NOTE — Progress Notes (Signed)
*  PRELIMINARY RESULTS* Echocardiogram 2D Echocardiogram has been performed.  [name] S [name] 05/23/2022, 10:57 AM 

## 2022-05-24 ENCOUNTER — Other Ambulatory Visit: Payer: Self-pay

## 2022-05-24 ENCOUNTER — Inpatient Hospital Stay: Payer: BC Managed Care – PPO

## 2022-05-24 DIAGNOSIS — E43 Unspecified severe protein-calorie malnutrition: Secondary | ICD-10-CM | POA: Diagnosis not present

## 2022-05-24 DIAGNOSIS — N179 Acute kidney failure, unspecified: Secondary | ICD-10-CM | POA: Diagnosis not present

## 2022-05-24 DIAGNOSIS — K7030 Alcoholic cirrhosis of liver without ascites: Secondary | ICD-10-CM | POA: Diagnosis not present

## 2022-05-24 DIAGNOSIS — J9602 Acute respiratory failure with hypercapnia: Secondary | ICD-10-CM

## 2022-05-24 DIAGNOSIS — R6510 Systemic inflammatory response syndrome (SIRS) of non-infectious origin without acute organ dysfunction: Secondary | ICD-10-CM | POA: Diagnosis not present

## 2022-05-24 DIAGNOSIS — Z9981 Dependence on supplemental oxygen: Secondary | ICD-10-CM

## 2022-05-24 DIAGNOSIS — J9601 Acute respiratory failure with hypoxia: Secondary | ICD-10-CM

## 2022-05-24 DIAGNOSIS — E872 Acidosis, unspecified: Secondary | ICD-10-CM | POA: Diagnosis not present

## 2022-05-24 DIAGNOSIS — R34 Anuria and oliguria: Secondary | ICD-10-CM

## 2022-05-24 LAB — CBC WITH DIFFERENTIAL/PLATELET
Abs Immature Granulocytes: 0.03 10*3/uL (ref 0.00–0.07)
Basophils Absolute: 0 10*3/uL (ref 0.0–0.1)
Basophils Relative: 0 %
Eosinophils Absolute: 0 10*3/uL (ref 0.0–0.5)
Eosinophils Relative: 0 %
HCT: 22.5 % — ABNORMAL LOW (ref 36.0–46.0)
Hemoglobin: 7.7 g/dL — ABNORMAL LOW (ref 12.0–15.0)
Immature Granulocytes: 0 %
Lymphocytes Relative: 29 %
Lymphs Abs: 2.4 10*3/uL (ref 0.7–4.0)
MCH: 26.3 pg (ref 26.0–34.0)
MCHC: 34.2 g/dL (ref 30.0–36.0)
MCV: 76.8 fL — ABNORMAL LOW (ref 80.0–100.0)
Monocytes Absolute: 0.4 10*3/uL (ref 0.1–1.0)
Monocytes Relative: 5 %
Neutro Abs: 5.2 10*3/uL (ref 1.7–7.7)
Neutrophils Relative %: 66 %
Platelets: 80 10*3/uL — ABNORMAL LOW (ref 150–400)
RBC: 2.93 MIL/uL — ABNORMAL LOW (ref 3.87–5.11)
RDW: 17.2 % — ABNORMAL HIGH (ref 11.5–15.5)
Smear Review: NORMAL
WBC: 8 10*3/uL (ref 4.0–10.5)
nRBC: 0 % (ref 0.0–0.2)

## 2022-05-24 LAB — CA 19-9 (SERIAL): CA 19-9: 206 U/mL — ABNORMAL HIGH (ref 0–35)

## 2022-05-24 LAB — BASIC METABOLIC PANEL
Anion gap: 9 (ref 5–15)
BUN: 15 mg/dL (ref 6–20)
CO2: 19 mmol/L — ABNORMAL LOW (ref 22–32)
Calcium: 8 mg/dL — ABNORMAL LOW (ref 8.9–10.3)
Chloride: 111 mmol/L (ref 98–111)
Creatinine, Ser: 1.77 mg/dL — ABNORMAL HIGH (ref 0.44–1.00)
GFR, Estimated: 37 mL/min — ABNORMAL LOW (ref >60)
Glucose, Bld: 134 mg/dL — ABNORMAL HIGH (ref 70–99)
Potassium: 3.4 mmol/L — ABNORMAL LOW (ref 3.5–5.1)
Sodium: 139 mmol/L (ref 135–145)

## 2022-05-24 LAB — TYPE AND SCREEN
ABO/RH(D): A NEG
Antibody Screen: NEGATIVE
Unit division: 0
Unit division: 0

## 2022-05-24 LAB — BPAM RBC
Blood Product Expiration Date: 202308312359
Blood Product Expiration Date: 202309182359
ISSUE DATE / TIME: 202308192310
ISSUE DATE / TIME: 202308200741
Unit Type and Rh: 600
Unit Type and Rh: 600

## 2022-05-24 LAB — URINE CULTURE: Culture: NO GROWTH

## 2022-05-24 LAB — LACTIC ACID, PLASMA: Lactic Acid, Venous: 3.3 mmol/L (ref 0.5–1.9)

## 2022-05-24 LAB — GLUCOSE, CAPILLARY
Glucose-Capillary: 131 mg/dL — ABNORMAL HIGH (ref 70–99)
Glucose-Capillary: 122 mg/dL — ABNORMAL HIGH (ref 70–99)
Glucose-Capillary: 140 mg/dL — ABNORMAL HIGH (ref 70–99)
Glucose-Capillary: 139 mg/dL — ABNORMAL HIGH (ref 70–99)
Glucose-Capillary: 182 mg/dL — ABNORMAL HIGH (ref 70–99)
Glucose-Capillary: 159 mg/dL — ABNORMAL HIGH (ref 70–99)

## 2022-05-24 LAB — PROTIME-INR
INR: 2.4 — ABNORMAL HIGH (ref 0.8–1.2)
Prothrombin Time: 26 seconds — ABNORMAL HIGH (ref 11.4–15.2)

## 2022-05-24 LAB — PHOSPHORUS: Phosphorus: 3.8 mg/dL (ref 2.5–4.6)

## 2022-05-24 LAB — MAGNESIUM: Magnesium: 2.4 mg/dL (ref 1.7–2.4)

## 2022-05-24 MED ORDER — NYSTATIN 100000 UNIT/ML MT SUSP
5.0000 mL | Freq: Once | OROMUCOSAL | Status: AC
  Administered 2022-05-24: 500000 [IU] via ORAL
  Filled 2022-05-24: qty 5

## 2022-05-24 MED ORDER — FUROSEMIDE 10 MG/ML IJ SOLN
60.0000 mg | Freq: Once | INTRAMUSCULAR | Status: AC
  Administered 2022-05-24: 60 mg via INTRAVENOUS
  Filled 2022-05-24: qty 6

## 2022-05-24 MED ORDER — COLLAGENASE 250 UNIT/GM EX OINT
TOPICAL_OINTMENT | Freq: Every day | CUTANEOUS | Status: DC
  Filled 2022-05-24: qty 30

## 2022-05-24 MED ORDER — FUROSEMIDE 10 MG/ML IJ SOLN
40.0000 mg | Freq: Once | INTRAMUSCULAR | Status: AC
  Administered 2022-05-24: 40 mg via INTRAVENOUS
  Filled 2022-05-24: qty 4

## 2022-05-24 MED ORDER — POTASSIUM CHLORIDE 10 MEQ/100ML IV SOLN
10.0000 meq | INTRAVENOUS | Status: AC
  Administered 2022-05-24 (×4): 10 meq via INTRAVENOUS
  Filled 2022-05-24 (×4): qty 100

## 2022-05-24 MED ORDER — PHYTONADIONE 5 MG PO TABS
5.0000 mg | ORAL_TABLET | Freq: Once | ORAL | Status: AC
  Administered 2022-05-24: 5 mg via ORAL
  Filled 2022-05-24: qty 1

## 2022-05-24 MED ORDER — FUROSEMIDE 10 MG/ML IJ SOLN: 60.0000 mg | Freq: Once | INTRAMUSCULAR | Status: DC

## 2022-05-24 NOTE — Progress Notes (Signed)
PROGRESS NOTE    Stephanie Levy  HYW:[id number] DOB: [birth year] DOA: 2022-05-23 PCP: Katrinka Blazing, MD  Outpatient Specialists: GI, wound care    Brief Narrative:  From admission h and p Stephanie Levy is a 41 y.o. female with medical history significant of AUD (in remission), alcoholic cirrhosis, chronic venous insufficiency with wounds, obesity s/p Gastric bypass surgery, MDD, HTN, malnutrition, ADHD who presents for worsening pain in the legs, weeping leg wounds and difficulty standing.  Stephanie Levy reports that since leaving the hospital in late July, she has had a slow decline in strength.  She notes that her legs will not work to move her from a sitting to standing position, however, once she is up, she can ambulate okay.  She feels that her thighs are weak and she concerned with falling.  She has persistent lesions on the legs which can be seen in photos in care everywhere.  She feels like they are worsening.  She has had weeping of clear fluids and pain.  She has gained weight up from 120# in May of this year and notes she is 153# today.  She is taking her diuretics and notes that she was recently increased in her lasix and spironolactone to 40mg  and 50mg  daily, but she feels her abdomen is actually bigger.  She has had no fever, chills, no nausea or vomiting.  She feels that she is eating okay at home.  She notes no change in mentation, no confusion, no pain with urination, no abdominal pain.    Assessment & Plan:   Principal Problem:   SIRS (systemic inflammatory response syndrome) (HCC) Active Problems:   Skin lesions   Alcohol abuse   HTN (hypertension)   Alcoholic cirrhosis of liver with ascites (HCC)   Depression with anxiety   ADHD   Diabetes mellitus without complication (HCC)   Normocytic anemia   Pancreatic lesion   Protein-calorie malnutrition, severe   Elevated lactic acid level   Hypomagnesemia   Bilateral lower extremity edema   Pressure injury of skin   Chronic  pain syndrome   History of gastric bypass   Neuropathy  # Cirrhosis, end-stage # Ascites GI following, per them no clear-cut signs of decompensation. History alcoholism. No overt signs bleeding. [place] 23 (14-15% 90-day mortality). U/s with patent portal vein, moderate ascites. No known varices but I do not see that she has ever had EGD. - holding home oral lasix/spiro - iv lasix today as below - paracentesis to eval for sbp. Continue cefepime - continue lactulose and titrate to 2-3 bm/day - rifaximin started by GI - continue albumin  # Anemia Hgb 7.7>6.3>6.7>7.3>7.7, has received 2 units. No overt signs of bleeding - monitor and trend, transfuse as needed, GI is following - trend coags - pharmacologic dvt ppx on hold  # Critical illness In the setting of severe incurable chronic disease - palliative consulted  # SIRS # Elevated lactic acid Presents altered, hypothermic, with markedly elevated lactic acid. Has chronic lower extremity wounds but they do not appear infected. Covid negative. Possibly all related to oncotic pressure. CXR with basilar opacities - blood and urine cultures drawn and pending - cont empiric vanc/cefepime - trend lactate, is improving - continue fluids and albumin - continue empiric vanc/cefepime  # Acute kidney injury # Oliguria Baseline normal kidney function. 2.04 on presentation, 1.77 this morning. Urinalysis unremarkable. Possible hepatorenal. Uop ~200 yesterday - trend - continue albumin - nephrology consulted - maintain foley  # Acute hypoxic respiratory  failure No dyspnea but worsening O2 this morning, likely fluid overload from fluid resuscitation. No chest pain - [place] O2 - lasix 60 IV - CXR  # Dysphagia - SLP swallow eval  # Hyperglycemia Presented hyperglycemic, now much improved with q4 SSI - continue SSI  # Hypokalemia K 3.4 this morning - replete and monitor  # Hypomagnesmia Normalized after repletion - monitor  #  Encephalopathy CT head negative. Improving  # Lower extremity wounds Has been followed by wound care clinic. Etiology unclear, perhaps secondary to chronic edema. Biopsy last month indeterminate, no signs vasculitis - wound care following  # Chronic pancreatitis with pancreatic insufficiency - cont home creon  # Severe malnutrition - RD consulted  # History alcoholism Reportedly no recent alcohol (stopped 02/2022) here no signs of withdrawal - monitor for signs of withdrawal  # ADHD Amphetamines in urine, denies illicit use, says prescribed aderall at home  # MDD Lithium level undetectable - resume home lithium      DVT prophylaxis: SCDs Code Status: full Family Communication: spouse Stephanie Levy updated at bedside 8/21  Level of care: Stepdown Status is: Inpatient Remains inpatient appropriate because: severity of illness    Consultants:  PCCM, GI  Procedures: pending  Antimicrobials:  Vanc/cefepime    Subjective: This morning awake, communicative, denies pain  Objective: Vitals:   05/24/22 0419 05/24/22 0700 05/24/22 0800 05/24/22 0843  BP:  98/67 99/69   Pulse: 96 (!) 106 100 (!) 102  Resp: (!) 9 11 10 17   Temp: 97.9 F (36.6 C) 98.6 F (37 C) 98.2 F (36.8 C) 98.1 F (36.7 C)  TempSrc:   Bladder   SpO2: 92% 95% 93% (!) 85%  Weight:      Height:        Intake/Output Summary (Last 24 hours) at 05/24/2022 0956 Last data filed at 05/24/2022 0800 Gross per 24 hour  Intake 4515.14 ml  Output 165 ml  Net 4350.14 ml   Filed Weights   06/04/22 1151  Weight: 69.4 kg    Examination:  General exam: sleepy but rouses, chronically ill appearing Respiratory system: normal rate. Rales at bases Cardiovascular system: S1 & S2 heard, RRR. No JVD, murmurs, rubs, gallops or clicks.  Gastrointestinal system: Abdomen is mildly distended, soft and nontender.  Central nervous system: asleep Extremities: pitting edema to abdomen Skin:    Psychiatry:  calm    Data Reviewed: I have personally reviewed following labs and imaging studies  CBC: Recent Labs  Lab 06-04-2022 1158 Jun 04, 2022 2036 05/23/22 0455 05/23/22 1512 05/23/22 2106 05/24/22 0504  WBC 7.5 6.8 8.2  --   --  8.0  NEUTROABS 5.4  --  6.2  --   --  5.2  HGB 7.7* 6.3* 6.7* 7.3* 7.8* 7.7*  HCT 23.6* 18.1* 19.7* 21.5* 22.7* 22.5*  MCV 81.7 78.7* 80.4  --   --  76.8*  PLT 155 111* 110*  --   --  80*   Basic Metabolic Panel: Recent Labs  Lab 2022/06/04 1158 06-04-2022 1450 06-04-22 1942 05/23/22 0455 05/23/22 1648 05/24/22 0504  NA 133*  --  135 139 137 139  K 3.1*  --  2.7* 3.0* 3.3* 3.4*  CL 101  --  108 109 106 111  CO2 17*  --  16* 18* 18* 19*  GLUCOSE 386*  --  324* 174* 147* 134*  BUN 16  --  13 15 16 15   CREATININE 2.04*  --  1.55* 1.93* 1.84* 1.77*  CALCIUM 7.5*  --  6.2* 7.7* 8.2* 8.0*  MG  --  1.4* 1.6* 2.7*  --  2.4  PHOS  --   --  3.7 4.5  --  3.8   GFR: Estimated Creatinine Clearance: 42.6 mL/min (A) (by C-G formula based on SCr of 1.77 mg/dL (H)). Liver Function Tests: Recent Labs  Lab 05/30/2022 1158 05/23/22 0455  AST 36 32  ALT 32 21  ALKPHOS 473* 317*  BILITOT 1.7* 1.7*  PROT 5.0* 4.7*  ALBUMIN <1.5* 2.2*   Recent Labs  Lab 05/31/2022 1158  LIPASE 16   Recent Labs  Lab 05/16/2022 1158 05/23/22 0455  AMMONIA 95* 66*   Coagulation Profile: Recent Labs  Lab 05/21/2022 1231 05/23/22 0455 05/24/22 0504  INR 1.5* 2.1* 2.4*   Cardiac Enzymes: Recent Labs  Lab 05/24/2022 1158 05/23/22 1127  CKTOTAL 66 26*   BNP (last 3 results) No results for input(s): "PROBNP" in the last 8760 hours. HbA1C: No results for input(s): "HGBA1C" in the last 72 hours. CBG: Recent Labs  Lab 05/23/22 1932 05/23/22 2331 05/23/22 2344 05/24/22 0408 05/24/22 0733  GLUCAP 135* 153* 152* 131* 122*   Lipid Profile: No results for input(s): "CHOL", "HDL", "LDLCALC", "TRIG", "CHOLHDL", "LDLDIRECT" in the last 72 hours. Thyroid Function Tests: Recent  Labs    05/31/2022 1158  TSH 5.651*  FREET4 0.67   Anemia Panel: No results for input(s): "VITAMINB12", "FOLATE", "FERRITIN", "TIBC", "IRON", "RETICCTPCT" in the last 72 hours. Urine analysis:    Component Value Date/Time   COLORURINE YELLOW (A) 06/02/2022 2036   APPEARANCEUR HAZY (A) 05/26/2022 2036   LABSPEC 1.017 05/09/2022 2036   PHURINE 5.0 05/15/2022 2036   GLUCOSEU 50 (A) 06/02/2022 2036   HGBUR NEGATIVE 05/21/2022 2036   BILIRUBINUR NEGATIVE 05/07/2022 2036   KETONESUR NEGATIVE 05/30/2022 2036   PROTEINUR NEGATIVE 05/18/2022 2036   NITRITE NEGATIVE 05/27/2022 2036   LEUKOCYTESUR NEGATIVE 05/24/2022 2036   Sepsis Labs: (procalcitonin:4,lacticidven:4)  ) Recent Results (from the past 240 hour(s))  SARS Coronavirus 2 by RT PCR (hospital order, performed in [facility] hospital lab) *cepheid single result test* Anterior Nasal Swab     Status: None   Collection Time: 05/27/2022 12:08 PM   Specimen: Anterior Nasal Swab  Result Value Ref Range Status   SARS Coronavirus 2 by RT PCR NEGATIVE NEGATIVE Final    Comment: (NOTE) SARS-CoV-2 target nucleic acids are NOT DETECTED.  The SARS-CoV-2 RNA is generally detectable in upper and lower respiratory specimens during the acute phase of infection. The lowest concentration of SARS-CoV-2 viral copies this assay can detect is 250 copies / mL. A negative result does not preclude SARS-CoV-2 infection and should not be used as the sole basis for treatment or other patient management decisions.  A negative result may occur with improper specimen collection / handling, submission of specimen other than nasopharyngeal swab, presence of viral mutation(s) within the areas targeted by this assay, and inadequate number of viral copies (<250 copies / mL). A negative result must be combined with clinical observations, patient history, and epidemiological information.  Fact Sheet for Patients:    RoadLapTop.co.za  Fact Sheet for Healthcare Providers: http://kim-miller.com/  This test is not yet approved or  cleared by the [place] FDA and has been authorized for detection and/or diagnosis of SARS-CoV-2 by FDA under an Emergency Use Authorization (EUA).  This EUA will remain in effect (meaning this test can be used) for the duration of the COVID-19 declaration under Section 564(b)(1) of the Act, 21 U.S.C. section  360bbb-3(b)(1), unless the authorization is terminated or revoked sooner.  Performed at [facility], [address] [place], [place] [address]   Blood culture (routine x 2)     Status: None (Preliminary result)   Collection Time: 06/15/2022 12:31 PM   Specimen: BLOOD  Result Value Ref Range Status   Specimen Description BLOOD BLOOD RIGHT FOREARM  Final   Special Requests   Final    BOTTLES DRAWN AEROBIC AND ANAEROBIC Blood Culture adequate volume   Culture   Final    NO GROWTH 2 DAYS Performed at [facility], [address], [place], [place] [address]    Report Status PENDING  Incomplete  Blood culture (routine x 2)     Status: None (Preliminary result)   Collection Time: 06-15-22 12:31 PM   Specimen: BLOOD  Result Value Ref Range Status   Specimen Description BLOOD BLOOD LEFT WRIST  Final   Special Requests   Final    BOTTLES DRAWN AEROBIC AND ANAEROBIC Blood Culture adequate volume   Culture   Final    NO GROWTH 2 DAYS Performed at [facility], [address] [place], [place] [address]    Report Status PENDING  Incomplete         Radiology Studies: DG Chest Port 1 View  Result Date: 05/24/2022 CLINICAL DATA:  Hypoxia. EXAM: PORTABLE CHEST 1 VIEW COMPARISON:  Chest x-ray 05/23/2022. FINDINGS: Right-sided skin fold. No visible pneumothorax. Similar left basilar opacities and low lung volumes. No definite pleural effusion. Cardiomediastinal silhouette is within normal  limits. Upper abdominal clips. IMPRESSION: Similar left basilar opacities and low lung volumes. Electronically Signed   By: Feliberto Harts M.D.   On: 05/24/2022 09:17   DG Chest Port 1 View  Result Date: 05/23/2022 CLINICAL DATA:  Weakness and inability to walk EXAM: PORTABLE CHEST 1 VIEW COMPARISON:  04/29/2022 and the CTA chest of later in the day FINDINGS: Patient rotated to the right. Numerous leads and wires project over the chest. Midline trachea. Normal heart size for level of inspiration. No pleural effusion or pneumothorax. Development of bilateral, mid and lower lung predominant interstitial and airspace disease. IMPRESSION: Diminished lung volumes with development of bilateral lower lobe interstitial and airspace disease. Favor infection/aspiration over pulmonary edema, especially given lack of pleural fluid. Electronically Signed   By: Jeronimo Greaves M.D.   On: 05/23/2022 17:24   ECHOCARDIOGRAM COMPLETE  Result Date: 05/23/2022    ECHOCARDIOGRAM REPORT   Patient Name:   Stephanie Levy Date of Exam: 05/23/2022 Medical Rec #:  [id number]     Height:       68.0 in Accession #:    [id number]    Weight:       153.0 lb Date of Birth:  1981/08/06     BSA:          1.824 m Patient Age:    41 years      BP:           104/77 mmHg Patient Gender: F             HR:           93 bpm. Exam Location:  [facility] Procedure: 2D Echo Indications:     Cardiomyopathy I42.9  History:         Patient has prior history of Echocardiogram examinations, most                  recent 10/14/2017.  Sonographer:  Overton Mam RDCS Referring Phys:  [id number] BRITTON L RUST-CHESTER Diagnosing Phys: Julien Nordmann MD IMPRESSIONS  1. Left ventricular ejection fraction, by estimation, is 60 to 65%. The left ventricle has normal function. The left ventricle has no regional wall motion abnormalities. Left ventricular diastolic parameters were normal.  2. Right ventricular systolic function is normal. The right ventricular size is normal.  Tricuspid regurgitation signal is inadequate for assessing PA pressure.  3. The mitral valve is normal in structure. No evidence of mitral valve regurgitation. No evidence of mitral stenosis.  4. The aortic valve is normal in structure. Aortic valve regurgitation is not visualized. No aortic stenosis is present.  5. The inferior vena cava is dilated in size with >50% respiratory variability, suggesting right atrial pressure of 8 mmHg. FINDINGS  Left Ventricle: Left ventricular ejection fraction, by estimation, is 60 to 65%. The left ventricle has normal function. The left ventricle has no regional wall motion abnormalities. The left ventricular internal cavity size was normal in size. There is  no left ventricular hypertrophy. Left ventricular diastolic parameters were normal. Right Ventricle: The right ventricular size is normal. No increase in right ventricular wall thickness. Right ventricular systolic function is normal. Tricuspid regurgitation signal is inadequate for assessing PA pressure. Left Atrium: Left atrial size was normal in size. Right Atrium: Right atrial size was normal in size. Pericardium: There is no evidence of pericardial effusion. Mitral Valve: The mitral valve is normal in structure. No evidence of mitral valve regurgitation. No evidence of mitral valve stenosis. Tricuspid Valve: The tricuspid valve is normal in structure. Tricuspid valve regurgitation is mild . No evidence of tricuspid stenosis. Aortic Valve: The aortic valve is normal in structure. Aortic valve regurgitation is not visualized. No aortic stenosis is present. Aortic valve peak gradient measures 3.3 mmHg. Pulmonic Valve: The pulmonic valve was normal in structure. Pulmonic valve regurgitation is not visualized. No evidence of pulmonic stenosis. Aorta: The aortic root is normal in size and structure. Venous: The inferior vena cava is dilated in size with greater than 50% respiratory variability, suggesting right atrial pressure  of 8 mmHg. IAS/Shunts: No atrial level shunt detected by color flow Doppler.  LEFT VENTRICLE PLAX 2D LVIDd:         4.00 cm     Diastology LVIDs:         2.40 cm     LV e' medial:    11.60 cm/s LV PW:         0.97 cm     LV E/e' medial:  7.2 LV IVS:        0.86 cm     LV e' lateral:   14.00 cm/s LVOT diam:     2.10 cm     LV E/e' lateral: 6.0 LV SV:         68 LV SV Index:   37 LVOT Area:     3.46 cm  LV Volumes (MOD) LV vol d, MOD A2C: 80.4 ml LV vol d, MOD A4C: 48.3 ml LV vol s, MOD A2C: 26.6 ml LV vol s, MOD A4C: 18.2 ml LV SV MOD A2C:     53.8 ml LV SV MOD A4C:     48.3 ml LV SV MOD BP:      41.4 ml RIGHT VENTRICLE RV Basal diam:  2.67 cm RV S prime:     15.10 cm/s TAPSE (M-mode): 2.2 cm LEFT ATRIUM             Index  RIGHT ATRIUM          Index LA diam:        3.10 cm 1.70 cm/m   RA Area:     8.60 cm LA Vol (A2C):   36.8 ml 20.18 ml/m  RA Volume:   14.60 ml 8.01 ml/m LA Vol (A4C):   24.3 ml 13.32 ml/m LA Biplane Vol: 30.0 ml 16.45 ml/m  AORTIC VALVE                 PULMONIC VALVE AV Area (Vmax): 3.60 cm     PV Vmax:       0.75 m/s AV Vmax:        90.90 cm/s   PV Peak grad:  2.2 mmHg AV Peak Grad:   3.3 mmHg LVOT Vmax:      94.60 cm/s LVOT Vmean:     59.100 cm/s LVOT VTI:       0.196 m  AORTA Ao Root diam: 3.30 cm Ao Asc diam:  3.00 cm MITRAL VALVE               TRICUSPID VALVE MV Area (PHT): 4.80 cm    TV Peak grad:   18.8 mmHg MV Decel Time: 158 msec    TV Vmax:        2.17 m/s MV E velocity: 83.60 cm/s MV A velocity: 65.10 cm/s  SHUNTS MV E/A ratio:  1.28        Systemic VTI:  0.20 m                            Systemic Diam: 2.10 cm Julien Nordmann MD Electronically signed by Julien Nordmann MD Signature Date/Time: 05/23/2022/3:38:21 PM    Final    US Abdomen Complete  Result Date: 05/23/2022 CLINICAL DATA:  Ascites due to alcoholic cirrhosis. Evaluate for portal vein thrombus. EXAM: ABDOMEN ULTRASOUND COMPLETE COMPARISON:  04/03/2022 FINDINGS: Gallbladder: Previous cholecystectomy. Common  bile duct: Diameter: 4 mm. Liver: Diffuse increased parenchymal echogenicity compatible with patient's known cirrhosis. No focal mass. Portal vein is patent on color Doppler imaging with normal direction of blood flow towards the liver. No evidence of portal vein thrombosis. IVC: No abnormality visualized. Pancreas: Visualized portion unremarkable. Spleen: Size and appearance within normal limits. Right Kidney: Length: 10.6 cm. Echogenicity within normal limits. No mass or hydronephrosis visualized. Left Kidney: Length: 9.6 cm. Echogenicity within normal limits. No mass or hydronephrosis visualized. Abdominal aorta: No aneurysm visualized. Other findings: Slight interval worsening moderate ascites. IMPRESSION: 1. Evidence of patient's known cirrhosis with slight interval worsening moderate ascites. Portal vein is patent without evidence of thrombosis. 2.  Prior cholecystectomy. Electronically Signed   By: Elberta Fortis M.D.   On: 05/23/2022 12:36   CT HEAD WO CONTRAST ( )  Result Date: May 24, 2022 CLINICAL DATA:  Head trauma, abnormal mental status.  Unknown cause. EXAM: CT HEAD WITHOUT CONTRAST TECHNIQUE: Contiguous axial images were obtained from the base of the skull through the vertex without intravenous contrast. RADIATION DOSE REDUCTION: This exam was performed according to the departmental dose-optimization program which includes automated exposure control, adjustment of the mA and/or kV according to patient size and/or use of iterative reconstruction technique. COMPARISON:  CT head dated October 13, 2017. MRI examination dated October 13, 2017. FINDINGS: Brain: No evidence of acute infarction, hemorrhage, hydrocephalus, extra-axial collection or mass lesion/mass effect. Vascular: No hyperdense vessel or unexpected calcification. Skull: Normal. Negative for fracture or focal lesion. Sinuses/Orbits: No  acute finding. Other: None. IMPRESSION: No acute intracranial abnormality. Electronically Signed   By:  Larose Hires D.O.   On: June 11, 2022 21:30        Scheduled Meds:  ascorbic acid  500 mg Oral BID   Chlorhexidine Gluconate Cloth  6 each Topical Daily   collagenase   Topical Daily   feeding supplement  237 mL Oral TID BM   insulin aspart  0-9 Units Subcutaneous Q4H   lactulose  30 g Oral TID   lipase/protease/amylase  36,000 Units Oral TID AC   lithium carbonate  150 mg Oral QPM   multivitamin with minerals  1 tablet Oral Daily   Ensure Max Protein  11 oz Oral BID   rifaximin  550 mg Oral BID   sodium chloride flush  3 mL Intravenous Q12H   Continuous Infusions:  ceFEPime (MAXIPIME) IV 2 g (05/24/22 0143)   potassium chloride 10 mEq (05/24/22 0859)   vancomycin Stopped (05/23/22 1544)     LOS: 2 days     Silvano Bilis, MD [facility]   If 7PM-7AM, please contact night-coverage [email or web address] Password [facility] 05/24/2022, 9:56 AM

## 2022-05-24 NOTE — Progress Notes (Signed)
Patient required the [facility] through the night to maintain temp. O2 via [place] at 2L placed this morning for sats 90-91%. Patient woke up when significant other arrived and accepted all HS meds. Patient is still very lethargic. Patient thad low urine output, provider made aware. IVF maintained. Patient have several bowel movements. Leg wraps changed. Extremities kept elevated. Patient slept through the night. Awakened minimally for vital signs, turning and repositioning. Significant other is at the bedside. Nurse call light kept within reach. Safety maintained. Will endorse.

## 2022-05-24 NOTE — Progress Notes (Signed)
Inpatient Follow-up/Progress Note   Patient ID: Stephanie Levy is a 41 y.o. female.  Overnight Events / Subjective Findings Patient continues to clinically deteriorate.  Her wife is at bedside at this time and they are awaiting the arrival of the patient's brother.  Patient with worsening hypoxemia despite high flow nasal cannula.  Patient more verbal today, however, remains confused.  Creatinine remains elevated as does lactate.  Hemoglobin stable at 7.7 but patient remains tachycardic with systolic blood pressures in the 90s.  Worsening coagulopathy. Patient has hard time finding words at times.  Review of Systems  Unable to perform ROS: Mental status change     Medications  Current Facility-Administered Medications:    albuterol (PROVENTIL) (2.5 MG/3ML) 0.083% nebulizer solution 3 mL, 3 mL, Inhalation, Daily PRN, Sid Falcon, MD   ascorbic acid (VITAMIN C) tablet 500 mg, 500 mg, Oral, BID, Rust-Chester, Britton L, NP, 500 mg at 05/23/22 2116   ceFEPIme (MAXIPIME) 2 g in sodium chloride 0.9 % 100 mL IVPB, 2 g, Intravenous, Q12H, Sid Falcon, MD, Stopped at 05/24/22 1403   Chlorhexidine Gluconate Cloth 2 % PADS 6 each, 6 each, Topical, Daily, Sid Falcon, MD, 6 each at 05/24/22 0938   collagenase (SANTYL) ointment, , Topical, Daily, Wouk, Ailene Rud, MD   fentaNYL (SUBLIMAZE) injection 12.5-50 mcg, 12.5-50 mcg, Intravenous, Q2H PRN, Gilles Chiquito B, MD   insulin aspart (novoLOG) injection 0-9 Units, 0-9 Units, Subcutaneous, Q4H, Rust-Chester, Britton L, NP, 1 Units at 05/24/22 1123   lactulose (CHRONULAC) 10 GM/15ML solution 30 g, 30 g, Oral, TID, Rust-Chester, Britton L, NP, 30 g at 05/23/22 2116   lip balm (BLISTEX) ointment, , Topical, PRN, Darrick Penna, [facility], Given at 05/24/22 0750   lipase/protease/amylase (CREON) capsule 36,000 Units, 36,000 Units, Oral, TID AC, Sid Falcon, MD   lithium carbonate capsule 150 mg, 150 mg, Oral, QPM, Rust-Chester, Toribio Harbour L, NP    multivitamin with minerals tablet 1 tablet, 1 tablet, Oral, Daily, Rust-Chester, Toribio Harbour L, NP   protein supplement (ENSURE MAX) liquid, 11 oz, Oral, BID, Wouk, Ailene Rud, MD   rifaximin Doreene Nest) tablet 550 mg, 550 mg, Oral, BID, Annamaria Helling, DO, 550 mg at 05/23/22 2116   sodium chloride flush (NS) 0.9 % injection 3 mL, 3 mL, Intravenous, Q12H, Gilles Chiquito B, MD, 3 mL at 05/24/22 0904   traMADol (ULTRAM) tablet 50 mg, 50 mg, Oral, Q12H PRN, Rust-Chester, Huel Cote, NP   vancomycin (VANCOCIN) IVPB 1000 mg/200 mL premix, 1,000 mg, Intravenous, Q24H, Nazari, Walid A, RPH, Last Rate: 200 mL/hr at 05/24/22 1357, 1,000 mg at 05/24/22 1357  ceFEPime (MAXIPIME) IV Stopped (05/24/22 1403)   vancomycin 1,000 mg (05/24/22 1357)    albuterol, fentaNYL (SUBLIMAZE) injection, lip balm, traMADol   Objective    Vitals:   05/24/22 1200 05/24/22 1250 05/24/22 1300 05/24/22 1400  BP: (!) 106/90  98/75 90/68  Pulse: 98 96 97 (!) 104  Resp: '15 13 13 15  ' Temp: (!) 97.2 F (36.2 C) (!) 97.5 F (36.4 C) (!) 97.5 F (36.4 C) 97.9 F (36.6 C)  TempSrc: Bladder     SpO2: 92% (!) 89% 90% 90%  Weight:      Height:         Physical Exam Vitals and nursing note reviewed.  Constitutional:      General: She is in acute distress.     Appearance: She is ill-appearing and toxic-appearing. She is not diaphoretic.     Comments:  Critically ill-appearing, appears older than stated age, cachectic  HENT:     Head: Atraumatic.     Comments: Bitemporal wasting    Nose: Nose normal.     Mouth/Throat:     Mouth: Mucous membranes are dry.     Pharynx: Oropharynx is clear.  Eyes:     General: Scleral icterus present.     Extraocular Movements: Extraocular movements intact.  Cardiovascular:     Rate and Rhythm: Regular rhythm. Tachycardia present.     Heart sounds: No murmur heard.    No friction rub. No gallop.  Pulmonary:     Comments: Worsening oxygen requirement now on high flow nasal cannula  saturating in the mid 80s Abdominal:     General: Abdomen is flat. Bowel sounds are normal.     Palpations: Abdomen is soft.     Tenderness: There is no abdominal tenderness.  Musculoskeletal:     Cervical back: Neck supple.     Comments: Lower extremities bandaged  Neurological:     Mental Status: She is alert.     Comments: Confusion present.  Patient has difficulty finding words to describe what she is trying to say      Laboratory Data Recent Labs  Lab 05/21/2022 1158 05/07/2022 2036 05/23/22 0455 05/23/22 1512 05/23/22 2106 05/24/22 0504  WBC 7.5 6.8 8.2  --   --  8.0  HGB 7.7* 6.3* 6.7* 7.3* 7.8* 7.7*  HCT 23.6* 18.1* 19.7* 21.5* 22.7* 22.5*  PLT 155 111* 110*  --   --  80*  NEUTOPHILPCT 74  --  77  --   --  66  LYMPHOPCT 23  --  20  --   --  29  MONOPCT 3  --  3  --   --  5  EOSPCT 0  --  0  --   --  0   Recent Labs  Lab 05/06/2022 1158 05/26/2022 1942 05/23/22 0455 05/23/22 1648 05/24/22 0504  NA 133*   < > 139 137 139  K 3.1*   < > 3.0* 3.3* 3.4*  CL 101   < > 109 106 111  CO2 17*   < > 18* 18* 19*  BUN 16   < > '15 16 15  ' CREATININE 2.04*   < > 1.93* 1.84* 1.77*  CALCIUM 7.5*   < > 7.7* 8.2* 8.0*  PROT 5.0*  --  4.7*  --   --   BILITOT 1.7*  --  1.7*  --   --   ALKPHOS 473*  --  317*  --   --   ALT 32  --  21  --   --   AST 36  --  32  --   --   GLUCOSE 386*   < > 174* 147* 134*   < > = values in this interval not displayed.   Recent Labs  Lab 05/17/2022 1231 05/23/22 0455 05/24/22 0504  INR 1.5* 2.1* 2.4*      Imaging Studies: DG Chest Port 1 View  Result Date: 05/24/2022 CLINICAL DATA:  Hypoxia. EXAM: PORTABLE CHEST 1 VIEW COMPARISON:  Chest x-ray 05/23/2022. FINDINGS: Right-sided skin fold. No visible pneumothorax. Similar left basilar opacities and low lung volumes. No definite pleural effusion. Cardiomediastinal silhouette is within normal limits. Upper abdominal clips. IMPRESSION: Similar left basilar opacities and low lung volumes.  Electronically Signed   By: Margaretha Sheffield M.D.   On: 05/24/2022 09:17   DG Chest [facility]  Result Date: 05/23/2022 CLINICAL DATA:  Weakness and inability to walk EXAM: PORTABLE CHEST 1 VIEW COMPARISON:  04/29/2022 and the CTA chest of later in the day FINDINGS: Patient rotated to the right. Numerous leads and wires project over the chest. Midline trachea. Normal heart size for level of inspiration. No pleural effusion or pneumothorax. Development of bilateral, mid and lower lung predominant interstitial and airspace disease. IMPRESSION: Diminished lung volumes with development of bilateral lower lobe interstitial and airspace disease. Favor infection/aspiration over pulmonary edema, especially given lack of pleural fluid. Electronically Signed   By: Abigail Miyamoto M.D.   On: 05/23/2022 17:24   ECHOCARDIOGRAM COMPLETE  Result Date: 05/23/2022    ECHOCARDIOGRAM REPORT   Patient Name:   Stephanie Levy Date of Exam: 05/23/2022 Medical Rec #:  [id number]     Height:       68.0 in Accession #:    [id number]    Weight:       153.0 lb Date of Birth:  1981-09-02     BSA:          1.824 m Patient Age:    25 years      BP:           104/77 mmHg Patient Gender: F             HR:           93 bpm. Exam Location:  [facility] Procedure: 2D Echo Indications:     Cardiomyopathy I42.9  History:         Patient has prior history of Echocardiogram examinations, most                  recent 10/14/2017.  Sonographer:     Kathlen Brunswick RDCS Referring Phys:  [id number] BRITTON L RUST-CHESTER Diagnosing Phys: Ida Rogue MD IMPRESSIONS  1. Left ventricular ejection fraction, by estimation, is 60 to 65%. The left ventricle has normal function. The left ventricle has no regional wall motion abnormalities. Left ventricular diastolic parameters were normal.  2. Right ventricular systolic function is normal. The right ventricular size is normal. Tricuspid regurgitation signal is inadequate for assessing PA pressure.  3. The mitral valve is  normal in structure. No evidence of mitral valve regurgitation. No evidence of mitral stenosis.  4. The aortic valve is normal in structure. Aortic valve regurgitation is not visualized. No aortic stenosis is present.  5. The inferior vena cava is dilated in size with >50% respiratory variability, suggesting right atrial pressure of 8 mmHg. FINDINGS  Left Ventricle: Left ventricular ejection fraction, by estimation, is 60 to 65%. The left ventricle has normal function. The left ventricle has no regional wall motion abnormalities. The left ventricular internal cavity size was normal in size. There is  no left ventricular hypertrophy. Left ventricular diastolic parameters were normal. Right Ventricle: The right ventricular size is normal. No increase in right ventricular wall thickness. Right ventricular systolic function is normal. Tricuspid regurgitation signal is inadequate for assessing PA pressure. Left Atrium: Left atrial size was normal in size. Right Atrium: Right atrial size was normal in size. Pericardium: There is no evidence of pericardial effusion. Mitral Valve: The mitral valve is normal in structure. No evidence of mitral valve regurgitation. No evidence of mitral valve stenosis. Tricuspid Valve: The tricuspid valve is normal in structure. Tricuspid valve regurgitation is mild . No evidence of tricuspid stenosis. Aortic Valve: The aortic valve is normal in structure. Aortic valve regurgitation is not visualized. No aortic stenosis  is present. Aortic valve peak gradient measures 3.3 mmHg. Pulmonic Valve: The pulmonic valve was normal in structure. Pulmonic valve regurgitation is not visualized. No evidence of pulmonic stenosis. Aorta: The aortic root is normal in size and structure. Venous: The inferior vena cava is dilated in size with greater than 50% respiratory variability, suggesting right atrial pressure of 8 mmHg. IAS/Shunts: No atrial level shunt detected by color flow Doppler.  LEFT VENTRICLE  PLAX 2D LVIDd:         4.00 cm     Diastology LVIDs:         2.40 cm     LV e' medial:    11.60 cm/s LV PW:         0.97 cm     LV E/e' medial:  7.2 LV IVS:        0.86 cm     LV e' lateral:   14.00 cm/s LVOT diam:     2.10 cm     LV E/e' lateral: 6.0 LV SV:         68 LV SV Index:   37 LVOT Area:     3.46 cm  LV Volumes (MOD) LV vol d, MOD A2C: 80.4 ml LV vol d, MOD A4C: 48.3 ml LV vol s, MOD A2C: 26.6 ml LV vol s, MOD A4C: 18.2 ml LV SV MOD A2C:     53.8 ml LV SV MOD A4C:     48.3 ml LV SV MOD BP:      41.4 ml RIGHT VENTRICLE RV Basal diam:  2.67 cm RV S prime:     15.10 cm/s TAPSE (M-mode): 2.2 cm LEFT ATRIUM             Index        RIGHT ATRIUM          Index LA diam:        3.10 cm 1.70 cm/m   RA Area:     8.60 cm LA Vol (A2C):   36.8 ml 20.18 ml/m  RA Volume:   14.60 ml 8.01 ml/m LA Vol (A4C):   24.3 ml 13.32 ml/m LA Biplane Vol: 30.0 ml 16.45 ml/m  AORTIC VALVE                 PULMONIC VALVE AV Area (Vmax): 3.60 cm     PV Vmax:       0.75 m/s AV Vmax:        90.90 cm/s   PV Peak grad:  2.2 mmHg AV Peak Grad:   3.3 mmHg LVOT Vmax:      94.60 cm/s LVOT Vmean:     59.100 cm/s LVOT VTI:       0.196 m  AORTA Ao Root diam: 3.30 cm Ao Asc diam:  3.00 cm MITRAL VALVE               TRICUSPID VALVE MV Area (PHT): 4.80 cm    TV Peak grad:   18.8 mmHg MV Decel Time: 158 msec    TV Vmax:        2.17 m/s MV E velocity: 83.60 cm/s MV A velocity: 65.10 cm/s  SHUNTS MV E/A ratio:  1.28        Systemic VTI:  0.20 m                            Systemic Diam: 2.10 cm Ida Rogue MD Electronically signed by Christia Reading  Gollan MD Signature Date/Time: 05/23/2022/3:38:21 PM    Final    US Abdomen Complete  Result Date: 05/23/2022 CLINICAL DATA:  Ascites due to alcoholic cirrhosis. Evaluate for portal vein thrombus. EXAM: ABDOMEN ULTRASOUND COMPLETE COMPARISON:  04/03/2022 FINDINGS: Gallbladder: Previous cholecystectomy. Common bile duct: Diameter: 4 mm. Liver: Diffuse increased parenchymal echogenicity compatible with  patient's known cirrhosis. No focal mass. Portal vein is patent on color Doppler imaging with normal direction of blood flow towards the liver. No evidence of portal vein thrombosis. IVC: No abnormality visualized. Pancreas: Visualized portion unremarkable. Spleen: Size and appearance within normal limits. Right Kidney: Length: 10.6 cm. Echogenicity within normal limits. No mass or hydronephrosis visualized. Left Kidney: Length: 9.6 cm. Echogenicity within normal limits. No mass or hydronephrosis visualized. Abdominal aorta: No aneurysm visualized. Other findings: Slight interval worsening moderate ascites. IMPRESSION: 1. Evidence of patient's known cirrhosis with slight interval worsening moderate ascites. Portal vein is patent without evidence of thrombosis. 2.  Prior cholecystectomy. Electronically Signed   By: Marin Olp M.D.   On: 05/23/2022 12:36   CT HEAD WO CONTRAST (5MM)  Result Date: 05/30/2022 CLINICAL DATA:  Head trauma, abnormal mental status.  Unknown cause. EXAM: CT HEAD WITHOUT CONTRAST TECHNIQUE: Contiguous axial images were obtained from the base of the skull through the vertex without intravenous contrast. RADIATION DOSE REDUCTION: This exam was performed according to the departmental dose-optimization program which includes automated exposure control, adjustment of the mA and/or kV according to patient size and/or use of iterative reconstruction technique. COMPARISON:  CT head dated October 13, 2017. MRI examination dated October 13, 2017. FINDINGS: Brain: No evidence of acute infarction, hemorrhage, hydrocephalus, extra-axial collection or mass lesion/mass effect. Vascular: No hyperdense vessel or unexpected calcification. Skull: Normal. Negative for fracture or focal lesion. Sinuses/Orbits: No acute finding. Other: None. IMPRESSION: No acute intracranial abnormality. Electronically Signed   By: Keane Police D.O.   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 and may have SBP which would move her into decompensation realm  -Sobriety per report since May 2023 -Seen in GI office on August 8 -Taking Lasix 40 mg daily and spironolactone 50 mg daily- currently held due to renal dysfunction and hypotension - MELD-Na 24; CP-C (as of 8/21)  # Portal hypertension -coagulopathy, hyperbilirubinemia, hyponatremia, thrombocytopenia, hypoalbuminemia   # Normocytic anemia w/o signs of GIB   # generalized debility # bilateral leg pain # DM2 with initial presentation with DKA   # Chronic pancreatitis -On home Creon- 36K 2-3 capsules with meal and 1-2 with snacks   # Multiple electrolyte derangement -Hyponatremia hypokalemia hypomagnesemia   # Cryptogenic skin lesions/findings on bilateral lower extremities with edema   #Severe protein calorie malnutrition #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 PCCM also involved.  Overall prognosis is poor and condition guarded. Pt's wife is awaiting arrival of patient's brother Pt underwent albumin challenge and creatinine has held, but uop declining. Paracentesis pending, but pt stability may prevent. -Continue lactulose to goal of 3 bm per day; do not need to trend serum ammonia, but follow patient clinically for encephalopathy -Start xifaxin 558m bid  -Monitor mental status  -  continue home creon - vitamin k for coagulopathy correction -Monitor H&H.  Transfusion and resuscitation as per primary team. Monitor for GIB -Continue IV fluid resuscitation given elevated lactate patient will take longer to clear given cirrhosis -P.o. as tolerated  with high-protein low-sodium - diuretics currently on hold given renal injury and hyoptension -Avoid hepatotoxic, nephrotoxic and sedating agents - currently on heparin dvt ppx - consider addition of daily ppi - on vancomycin and cefepime as per primary team  -Check alk phos isoenzyme and phosphatidyl ethanol (peth) -Evaluate for viral hepatitis and complement factors -UDS positive for amphetamines, however, Adderall filled on March 18, 2022  - follow daily cmp, cbc, inr   -Glucose control and electrolyte correction as per primary team -Supportive care as per primary team  -Palliative care consult in place given current condition. Pt is appropriate for hospice at this time if family is agreeable as overall prognosis is poor given decompensation   I personally performed the service.  Management of other medical comorbidities as per primary team  Thank you for allowing [place] to participate in this patient's care. Please don't hesitate to call if any questions or concerns arise.   Annamaria Helling, DO [facility]  Portions of the record may have been created with voice recognition software. Occasional wrong-word or 'sound-a-like' substitutions may have occurred due to the inherent limitations of voice recognition software.  Read the chart carefully and recognize, using context, where substitutions may have occurred.

## 2022-05-24 NOTE — Progress Notes (Signed)
0840: pt oxygen saturations decreased to 84%; increased from 2L to 6L on the nasal cannula. No increased work of breathing, tachypnea or new tachycardia. Wouk, MD notified; orders placed for chest XRAY and IV Lasix.  Pt wife tearful and anxious. Goals of care and medical management discussed with wife and Dr. Ashok Pall.   Pt refusing oral medications. Discussion made with Dr. Ashok Pall about possible complications with NG tube placement.  1250: pt requiring more oxygen support; placed on HFNC at 10L, oxygen saturations still 88-90%. Wouk, MD notified. Orders placed for IV lasix.

## 2022-05-24 NOTE — Progress Notes (Signed)
Initial Nutrition Assessment  DOCUMENTATION CODES:   Severe malnutrition in context of chronic illness  INTERVENTION:   Ensure Max protein supplement BID, each supplement provides 150kcal and 30g of protein.  MVI po daily   Vitamin C 549m po BID  Pt at high risk for nutrient deficiencies r/t her gastric surgery. Will check thiamine, B12, folate, iron, TIBC, ferritin, zinc, copper and vitamins C, D, A & E.   Pt at high refeed risk; recommend monitor potassium, magnesium and phosphorus labs daily until stable  Liberalize diet   NUTRITION DIAGNOSIS:   Severe Malnutrition related to chronic illness (cirrhosis, chronic pancreatitis with EPI, h/o roux-en-y) as evidenced by severe fat depletion, severe muscle depletion.  GOAL:   Patient will meet greater than or equal to 90% of their needs  MONITOR:   PO intake, Supplement acceptance, Labs, Weight trends, Skin, I & O's  REASON FOR ASSESSMENT:   Consult Assessment of nutrition requirement/status  ASSESSMENT:   41y.o. female with medical history significant of AUD (in remission), alcoholic cirrhosis, chronic venous insufficiency with wounds, obesity s/p Gastric bypass surgery (2008), chronic diarrhea, chronic pancreatitis with EPI on creon, chronic back pain, anxiety, MI, DM, hernia repair (2104), MDD, HTN and ADHD who presents for worsening pain in the legs, weeping leg wounds and difficulty standing. Pt found to have SIRS and AKI.  Met with pt in room today. Pt reports good appetite and oral intake at baseline but reports that she hasn't eaten anything for the past 2 days. Family member at bedside reports pt with good oral intake at baseline and reports that pt has gained ~30lbs recently. Per chart, pt is up ~26lbs over the past 6 weeks; RD unsure if weight gain is true weight gain or r/t ascites and fluid changes. Pt has lost over 100lbs over the past several years. Pt with h/o roux-en-y gastric bypass. Pt reports that she does  drink chocolate protein drinks at home and takes her bariatric vitamins daily. Pt seen by SLP today and placed on a regular diet. RD discussed with pt the importance of adequate nutrition needed to preserve lean muscle and support wound healing. RD will add supplements and vitamins to help pt meet her estimated needs. Pt is at high refeed risk. RD will check bariatric vitamin labs to r/o deficiency.   Medications reviewed and include: ascorbic acid, insulin, lactulose, creon, lithium, xifaxin, cefepime, KCl, vancomycin   Labs reviewed: K 3.4(L), creat 1.77(H), P 3.8 wnl, Mg 2.4 wnl Hgb 7.7(L), Hct 22.7(L) Cbgs- 140, 122, 131 x 24 hrs AIC 6.3(H)- 7/27  NUTRITION - FOCUSED PHYSICAL EXAM:  Flowsheet Row Most Recent Value  Orbital Region Severe depletion  Upper Arm Region Moderate depletion  Thoracic and Lumbar Region Severe depletion  Buccal Region Moderate depletion  Temple Region Severe depletion  Clavicle Bone Region Severe depletion  Clavicle and Acromion Bone Region Severe depletion  Scapular Bone Region Severe depletion  Dorsal Hand Moderate depletion  Patellar Region Moderate depletion  Anterior Thigh Region Moderate depletion  Posterior Calf Region  Severe depletion  Edema (RD Assessment) Moderate  Hair Reviewed  Eyes Reviewed  Mouth Reviewed  Skin Reviewed  Nails Reviewed   Diet Order:   Diet Order             Diet regular Room service appropriate? Yes with Assist; Fluid consistency: Thin  Diet effective now                  EDUCATION NEEDS:  Education needs have been addressed  Skin:  Skin Assessment: Reviewed RN Assessment (swelling and weeping to the lower extremities bilaterally with open wounds)  Last BM:  8/20 -type 7  Height:   Ht Readings from Last 1 Encounters:  05/11/2022 '5\' 8"'  (1.727 m)    Weight:   Wt Readings from Last 1 Encounters:  06/02/2022 69.4 kg    Ideal Body Weight:  63.6 kg  BMI:  Body mass index is 23.26 kg/m.  Estimated  Nutritional Needs:   Kcal:  2000-2300kcal/day  Protein:  100-115g/day  Fluid:  2.1-2.4L/day  Koleen Distance MS, RD, LDN Please refer to [facility] for RD and/or RD on-call/weekend/after hours pager

## 2022-05-24 NOTE — Progress Notes (Signed)
       CROSS COVER NOTE  NAME: Stephanie Levy MRN: [id number] DOB : Apr 30, 1981    Date of Service   05/24/22  HPI/Events of Note   White plaques on tongue  Interventions   Plan: Nystatin swish/swallow x1 X X      This document was prepared using Dragon voice recognition software and may include unintentional dictation errors.  Bishop Limbo DNP, MHA, FNP-BC Nurse Practitioner [facility] Pager [phone]

## 2022-05-24 NOTE — Consult Note (Signed)
WOC Nurse Consult Note: Reason for Consult: LE wounds Patient last seen in [facility] 05/19/22 per my previous recommendations during her admission in July 2032.  Requested ulcer bx at that time as well, negative for vasculitis and/or PG Wounds have declined since her last admission  Wound type: unclear etiology, venous stasis ulcerations do not typically involve the thighs Patient is positive for amphetamines this admission which I was suspect for these wounds previously, either levamisole or Tranq related wounds.  Pressure Injury POA: NA Measurement:  05/19/2022; increased swelling and weeping to the lower extremities bilaterally with open wounds secondary to Hypoalbuminemia from severe protein malnutrition In the setting of alcoholic cirrhosis. Bilateral lower extremity wounds with weeping. On the left side there are multiple open wounds with nonviable tissue present. No surrounding signs of infection. 3+ pitting edema to the thigh. Wound bed: several areas with non viable tissue Drainage (amount, consistency, odor) moderate per nursing notes Periwound:edema, and fungal looking  Dressing procedure/placement/frequency: In the [facility] they have recently added HHRN, however I am not able to see if the patient received initiation of these services  Topical care would not change even in the presence of levamisole or tranq wounds (fentanyl mixed with xylazine).  Santyl to the wounds with necrotic tissue Xeroform to the other non necrotic open wounds over the bilateral feet and legs.  Wrap legs from toes to above the knees with kerlix and 4" ACE wraps  I think less frequent dressing changes can be valuable for pain control in patient's with open lesions of this sort. Will update orders  FU in wound care center as scheduled, re-start [facility] for wound care  Re consult if needed, will not follow at this time. Thanks  Shakeda Pearse [employer], RN,CWOCN, CNS, CWON-AP [phone])

## 2022-05-24 NOTE — TOC Initial Note (Signed)
Transition of Care [facility]) - Initial/Assessment Note    Patient Details  Name: Stephanie Levy MRN: [id number] Date of Birth: 1980-11-01  Transition [facility]) CM/SW Contact:    Shelbie Hutching, RN Phone Number: 05/24/2022, 12:08 PM  Clinical Narrative:                 Patient admitted to the hospital with SIRS, history of liver cirrhosis with ascites, protein calorie malnutrition (severe), history of gastric bypass. RNCM met with patient and her spouse at the bedside, patient is emaciated, appears very weak, spouse reports that patient has really gone downhill this past week to the point where she can't take care of herself or move on her own, before this she was living alone.  Spouse and patient are separated and they have their own residences.  Patient has been staying with spouse this past week unable to care for herself.  Patient's spouse would like help applying for Medicaid for the patient.  RNCM reached out to Financial services to assist with Medicaid application.   Patient has wounds on her sacrum and bilateral legs, she has been getting [facility].  Patient is on acute oxygen as of this morning.  TOC will follow medical progression and assist with disposition planning.    Expected Discharge Plan:  (TBD) Barriers to Discharge: Continued Medical Work up   Patient Goals and CMS Choice Patient states their goals for this hospitalization and ongoing recovery are:: spouse would like help applying for Medicaid      Expected Discharge Plan and Services Expected Discharge Plan:  (TBD) In-house Referral: Hospice / Palliative Care, Financial Counselor Discharge Planning Services: CM Consult   Living arrangements for the past 2 months: Single Family Home                                      Prior Living Arrangements/Services Living arrangements for the past 2 months: Single Family Home Lives with:: Self Patient language and need for interpreter reviewed:: Yes Do  you feel safe going back to the place where you live?: Yes      Need for Family Participation in Patient Care: Yes (Comment) Care giver support system in place?: Yes (comment) Current home services: DME, Home OT, Home PT Criminal Activity/Legal Involvement Pertinent to Current Situation/Hospitalization: No - Comment as needed  Activities of Daily Living Home Assistive Devices/Equipment: Shower chair with back, Bedside commode/3-in-1, Cane (specify quad or straight), Walker (specify type) ADL Screening (condition at time of admission) Patient's cognitive ability adequate to safely complete daily activities?: No Is the patient deaf or have difficulty hearing?: No Does the patient have difficulty seeing, even when wearing glasses/contacts?: No Does the patient have difficulty concentrating, remembering, or making decisions?: No Patient able to express need for assistance with ADLs?: Yes Does the patient have difficulty dressing or bathing?: Yes Independently performs ADLs?: No Does the patient have difficulty walking or climbing stairs?: Yes Weakness of Legs: Both Weakness of Arms/Hands: None  Permission Sought/Granted Permission sought to share information with : Case Manager, Family Supports, Other (comment) Permission granted to share information with : Yes, Verbal Permission Granted  Share Information with NAME: Amairany Schumpert  Permission granted to share info w AGENCY: [facility]  Permission granted to share info w Relationship: spouse  Permission granted to share info w Contact Information: [phone]  Emotional Assessment Appearance:: Appears older than stated age Attitude/Demeanor/Rapport:  Self-Absorbed, Lethargic Affect (typically observed): Accepting Orientation: : Oriented to Self Alcohol / Substance Use: Alcohol Use Psych Involvement: No (comment)  Admission diagnosis:  SIRS (systemic inflammatory response syndrome) (HCC) [R65.10] AKI (acute kidney injury) ([place])  [N17.9] Hypothermia, initial encounter [T68.XXXA] Sepsis due to cellulitis ([place]) [L03.90, A41.9] Patient Active Problem List   Diagnosis Date Noted   SIRS (systemic inflammatory response syndrome) ([place]) 05/04/2022   Protein-calorie malnutrition, severe 04/30/2022   Myocardial injury 04/29/2022   Pancreatic lesion 04/29/2022   Elevated lactic acid level 04/29/2022   Hypomagnesemia 04/29/2022   Pressure injury of skin 04/03/2022   Skin lesions 82/64/1583   Alcoholic cirrhosis of liver with ascites ([place]) 04/02/2022   Depression with anxiety 04/02/2022   ADHD    Diabetes mellitus without complication ([place])    Normocytic anemia    Bilateral lower extremity edema    Major depressive disorder, recurrent episode, moderate ([place]) 09/40/7680   Alcoholic pancreatitis 88/08/314   HTN (hypertension) 12/24/2018   Alcohol abuse 12/24/2018   Anxiety 12/24/2018   Chronic pain syndrome 09/16/2017   Closed displaced fracture of posterior process of left talus 03/25/2017   Neuropathy 12/09/2014   History of gastric bypass 08/28/2013   PCP:  Konrad Saha, MD Pharmacy:   [id number] - [address], [place] [address] Phone: [phone] Fax: [phone]  [id number] [place] [address], [place] - [address] [facility] [address] [place] [address] Phone: [phone] Fax: [phone]     Social Determinants of Health (SDOH) Interventions    Readmission Risk Interventions     No data to display
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
## 2022-05-24 NOTE — IPAL (Addendum)
  Interdisciplinary Goals of Care Family Meeting   Date carried out: 05/24/2022  Location of the meeting: Bedside  Member's involved: Physician, Nurse Practitioner, Family Member or next of kin, and Other: GI DOC   GOALS OF CARE DISCUSSION  The Clinical status was relayed to family in detail-Wife Stephanie Levy at bedside  Patient with slight increased WOB and hypoxia More confusion in last 12 hrs Explained to family course of therapy and the modalities   Patient with Progressive multiorgan failure with a very high probablity of a very minimal chance of meaningful recovery despite all aggressive and optimal medical therapy.    The wife Stephanie Levy  understands the situation.  She has agreed and  consented and  DNR/DNI status We will NOT do CPR We will NOT do Intubation We will NOT do Hemodialysis   Family are satisfied with Plan of action and management. All questions answered  Additional CC time 45 mins   Stephanie Levy, M.D.  [facility]  Medical Director [facility] Medical Director [facility] Cardio-Pulmonary Department

## 2022-05-24 NOTE — Progress Notes (Signed)
NAME:  Stephanie Levy, MRN:  [id number], DOB:  Nov 10, 1980, LOS: 2 ADMISSION DATE:  05/10/2022 CONSULTATION DATE:  05/24/2022  REFERRING MD:  Ellender Hose CHIEF COMPLAINT:  low temp and cant walk  BRIEF SYNOPSIS 41 year old African-American female with severe malnutrition with history of end-stage liver disease and liver cirrhosis due to alcohol abuse PCCM consulted for severe hypothermia   h/o end stage liver cirrhosis, alcohol dependence  (stopped drinking 5 mo ago),   Presented with bilateral lower extremity weakness and leg pain  Progressive weakness over the last 1 week and inability to walk  Patient has been losing significant amount of weight  Previously admitted for rash and wounds several weeks but likely venous stasis and swelling and dry skin  Signs and symptoms of orthostasis with dizziness with standing  No chest pain no shortness of breath Patient states increased appetite asking for food Temperature was noted to be 91 Patient placed on a Bair hugger  Pertinent labs in the ER Potassium 3.1 bicarb 17 blood sugar 386 Findings consistent with DKA with elevated anion gap Alkaline phos elevated at 473 Albumin less than 1.5 Ammonia level 95 T. bili 1.7 Hemoglobin 7.7 Platelet count 155 INR 1.5 TSH 5.6   EVENTS OVERNIGHT Progressive hypoxia and confusion in last 12 hrs Creatinine worsening More confusion Relayed to wife and ICU nurse to make her comfortable Patient is suffering and dying process   BP 90/68   Pulse (!) 109   Temp 97.9 F (36.6 C)   Resp 15   Ht 5\' 8"  (1.727 m)   Wt 69.4 kg   LMP 07/30/2020   SpO2 94%   BMI 23.26 kg/m      Latest Ref Rng & Units 05/24/2022    5:04 AM 05/23/2022    4:48 PM 05/23/2022    4:55 AM  BMP  Glucose 70 - 99 mg/dL 134  147  174   BUN 6 - 20 mg/dL 15  16  15    Creatinine 0.44 - 1.00 mg/dL 1.77  1.84  1.93   Sodium 135 - 145 mmol/L 139  137  139   Potassium 3.5 - 5.1 mmol/L 3.4  3.3  3.0   Chloride 98 - 111 mmol/L  111  106  109   CO2 22 - 32 mmol/L 19  18  18    Calcium 8.9 - 10.3 mg/dL 8.0  8.2  7.7      Significant Hospital Events: Including procedures, antibiotic start and stop dates in addition to other pertinent events   8/19 admitted to [place] [facility] [place] for DKA and hypothermia 8/20 remains sick, temp better, sugars better 8/21 remains sick, hypoxia, patient declining     Micro Data:  Blood cultures pending COVID test negative  Antimicrobials:   Antibiotics Given (last 72 hours)     Date/Time Action Medication Dose Rate   06/01/2022 1255 New Bag/Given   ceFEPIme (MAXIPIME) 2 g in sodium chloride 0.9 % 100 mL IVPB 2 g 200 mL/hr   05/21/2022 1346 New Bag/Given   vancomycin (VANCOREADY) IVPB 1500 mg/300 mL 1,500 mg 150 mL/hr   05/23/22 0233 New Bag/Given   ceFEPIme (MAXIPIME) 2 g in sodium chloride 0.9 % 100 mL IVPB 2 g 200 mL/hr   05/23/22 1357 New Bag/Given   ceFEPIme (MAXIPIME) 2 g in sodium chloride 0.9 % 100 mL IVPB 2 g 200 mL/hr   05/23/22 1444 New Bag/Given   vancomycin (VANCOCIN) IVPB 1000 mg/200 mL premix 1,000 mg 200 mL/hr   05/23/22  2116 Given   rifaximin (XIFAXAN) tablet 550 mg 550 mg    05/24/22 0143 New Bag/Given   ceFEPIme (MAXIPIME) 2 g in sodium chloride 0.9 % 100 mL IVPB 2 g 200 mL/hr   05/24/22 1323 New Bag/Given   ceFEPIme (MAXIPIME) 2 g in sodium chloride 0.9 % 100 mL IVPB 2 g 200 mL/hr   05/24/22 1357 New Bag/Given   vancomycin (VANCOCIN) IVPB 1000 mg/200 mL premix 1,000 mg 200 mL/hr        GI consultation with [facility] 05/11/2022 consultation & management of severe protein calorie malnutrition.  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  Patient was recently admitted to [facility] on 05/01/2022, GI was consulted for new diagnosis of cirrhosis and chronic diarrhea, severe protein calorie  malnutrition and skin lesions in bilateral lower extremities with suspicion for vasculitis or pyoderma gangrenosum.  Patient underwent skin biopsy which was negative for vasculitis or pyoderma gangrenosum and consistent with stasis dermatitis.  During inpatient,  started her on Creon 72 K capsules with each meal and 36 K with snack.                 Objective   Blood pressure 90/68, pulse (!) 109, temperature 97.9 F (36.6 C), resp. rate 15, height 5\' 8"  (1.727 m), weight 69.4 kg, last menstrual period 07/30/2020, SpO2 94 %.    FiO2 (%):  [80 %] 80 %   Intake/Output Summary (Last 24 hours) at 05/24/2022 1523 Last data filed at 05/24/2022 1403 Gross per 24 hour  Intake 3807.87 ml  Output 1815 ml  Net 1992.87 ml    Filed Weights   05/19/2022 1151  Weight: 69.4 kg     Review of Systems:  Gen:+weight loss  HEENT: dry lips No chest pain SOB Other:  All other systems negative        PHYSICAL EXAMINATION:  GENERAL:critically ill appearing,  EYES: Pupils equal, round, reactive to light.  MOUTH: dry mucosal membrane.  NECK: Supple.  PULMONARY: +rhonchi, +wheezing CARDIOVASCULAR: S1 and S2.  No murmurs  GASTROINTESTINAL: Soft, decreased bowel sounds NEUROLOGIC: confused    Labs/imaging that I havepersonally reviewed  (right click and "Reselect all SmartList Selections" daily)     ASSESSMENT AND PLAN SYNOPSIS   41 year old African-American female with severe malnutrition PAO with history of end-stage liver disease and liver cirrhosis due to alcohol abuse PCCM consulted for severe hypothermia associated with renal failure  Progressive liver failure and cirrhosis with acidosis and progressive renal failure  Patient is suffering and dying process  Severe ACUTE Hypoxic and Hypercapnic Respiratory Failure Due to progressive liver disease and acidosis Therapy with high flow [place] Continue Oxygen therapy    ACIDOSIS AND ELEVATED LA CONTINUE  IVF's   CARDIAC ICU monitoring  ACUTE KIDNEY INJURY/Renal Failure -continue Foley Catheter-assess need -Avoid nephrotoxic agents -Follow urine output, BMP -Ensure adequate renal perfusion, optimize oxygenation -Renal dose medications   Intake/Output Summary (Last 24 hours) at 05/24/2022 1528 Last data filed at 05/24/2022 1403 Gross per 24 hour  Intake 3807.87 ml  Output 1815 ml  Net 1992.87 ml    INFECTIOUS DISEASE Source may be skin lesions but less likley HIV status NEG  GI GI PROPHYLAXIS as indicated  NUTRITIONAL STATUS DIET-->as tolerated Constipation protocol as indicated  ELECTROLYTES -follow labs as needed -replace as needed -pharmacy consultation and following  ACUTE ANEMIA- TRANSFUSE AS NEEDED CONSIDER TRANSFUSION  IF HGB<7 DVT PRX with TED/SCD's ONLY     Best practice (right click and "Reselect all SmartList Selections" daily)  Diet:REGULAR Mobility:  bed rest  Code Status:  FULL Disposition:ICU  Labs   CBC: Recent Labs  Lab 05/10/2022 1158 05/05/2022 2036 05/23/22 0455 05/23/22 1512 05/23/22 2106 05/24/22 0504  WBC 7.5 6.8 8.2  --   --  8.0  NEUTROABS 5.4  --  6.2  --   --  5.2  HGB 7.7* 6.3* 6.7* 7.3* 7.8* 7.7*  HCT 23.6* 18.1* 19.7* 21.5* 22.7* 22.5*  MCV 81.7 78.7* 80.4  --   --  76.8*  PLT 155 111* 110*  --   --  80*     Basic Metabolic Panel: Recent Labs  Lab 06/02/2022 1158 06/01/2022 1450 05/27/2022 1942 05/23/22 0455 05/23/22 1648 05/24/22 0504  NA 133*  --  135 139 137 139  K 3.1*  --  2.7* 3.0* 3.3* 3.4*  CL 101  --  108 109 106 111  CO2 17*  --  16* 18* 18* 19*  GLUCOSE 386*  --  324* 174* 147* 134*  BUN 16  --  13 15 16 15   CREATININE 2.04*  --  1.55* 1.93* 1.84* 1.77*  CALCIUM 7.5*  --  6.2* 7.7* 8.2* 8.0*  MG  --  1.4* 1.6* 2.7*  --  2.4  PHOS  --   --  3.7 4.5  --  3.8    GFR: Estimated Creatinine Clearance: 42.6 mL/min (A) (by C-G formula based on SCr of 1.77 mg/dL (H)). Recent Labs  Lab 05/04/2022 1158  06/03/2022 1450 05/26/2022 2036 05/23/22 0455 05/23/22 1359 05/24/22 0504  WBC 7.5  --  6.8 8.2  --  8.0  LATICACIDVEN  --    < > 4.3* 6.6* 3.9* 3.3*   < > = values in this interval not displayed.     Liver Function Tests: Recent Labs  Lab 05/21/2022 1158 05/23/22 0455  AST 36 32  ALT 32 21  ALKPHOS 473* 317*  BILITOT 1.7* 1.7*  PROT 5.0* 4.7*  ALBUMIN <1.5* 2.2*    Recent Labs  Lab 05/14/2022 1158  LIPASE 16    Recent Labs  Lab 05/10/2022 1158 05/23/22 0455  AMMONIA 95* 66*     ABG    Component Value Date/Time   HCO3 16.6 (L) 05/23/2022 0455   ACIDBASEDEF 7.7 (H) 05/23/2022 0455   O2SAT 93.5 05/23/2022 0455     Coagulation Profile: Recent Labs  Lab 05/08/2022 1231 05/23/22 0455 05/24/22 0504  INR 1.5* 2.1* 2.4*     Cardiac Enzymes: Recent Labs  Lab 05/11/2022 1158 05/23/22 1127  CKTOTAL 66 26*     HbA1C: Hgb A1c MFr Bld  Date/Time Value Ref Range Status  04/29/2022 10:50 AM 6.3 (H) 4.8 - 5.6 % Final    Comment:    (NOTE) Pre diabetes:          5.7%-6.4%  Diabetes:              >6.4%  Glycemic control for   <7.0% adults with diabetes   04/04/2022 07:26 AM 6.8 (H) 4.8 - 5.6 % Final    Comment:    (NOTE)         Prediabetes: 5.7 - 6.4         Diabetes: >6.4         Glycemic control for adults with diabetes: <7.0     CBG:  Recent Labs  Lab 05/23/22 2331 05/23/22 2344 05/24/22 0408 05/24/22 0733 05/24/22 1117  GLUCAP 153* 152* 131* 122* 140*     DVT/GI PRX  assessed I Assessed the need for Labs I Assessed the need for Foley I Assessed the need for Central Venous Line Family Discussion when available I Assessed the need for Mobilization I made an Assessment of medications to be adjusted accordingly Safety Risk assessment completed  CASE DISCUSSED IN MULTIDISCIPLINARY ROUNDS WITH ICU TEAM     Critical Care Time devoted to patient care services described in this note is 55 minutes.  Critical care was necessary to treat  /prevent imminent and life-threatening deterioration. Overall, patient is critically ill, prognosis is guarded.  Patient with Multiorgan failure and at high risk for cardiac arrest and death.    Lucie Leather, M.D.  [facility]  Medical Director [facility] Medical Director [facility] Cardio-Pulmonary Department
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
## 2022-05-25 DIAGNOSIS — Z515 Encounter for palliative care: Secondary | ICD-10-CM

## 2022-05-25 DIAGNOSIS — K7031 Alcoholic cirrhosis of liver with ascites: Secondary | ICD-10-CM | POA: Diagnosis not present

## 2022-05-25 DIAGNOSIS — F1010 Alcohol abuse, uncomplicated: Secondary | ICD-10-CM | POA: Diagnosis not present

## 2022-05-25 DIAGNOSIS — R6511 Systemic inflammatory response syndrome (SIRS) of non-infectious origin with acute organ dysfunction: Secondary | ICD-10-CM

## 2022-05-25 DIAGNOSIS — R600 Localized edema: Secondary | ICD-10-CM

## 2022-05-25 DIAGNOSIS — K7210 Chronic hepatic failure without coma: Secondary | ICD-10-CM

## 2022-05-25 DIAGNOSIS — Z9884 Bariatric surgery status: Secondary | ICD-10-CM

## 2022-05-25 DIAGNOSIS — E43 Unspecified severe protein-calorie malnutrition: Secondary | ICD-10-CM

## 2022-05-25 DIAGNOSIS — F418 Other specified anxiety disorders: Secondary | ICD-10-CM

## 2022-05-25 DIAGNOSIS — R6510 Systemic inflammatory response syndrome (SIRS) of non-infectious origin without acute organ dysfunction: Secondary | ICD-10-CM | POA: Diagnosis not present

## 2022-05-25 LAB — MAGNESIUM: Magnesium: 2.1 mg/dL (ref 1.7–2.4)

## 2022-05-25 LAB — BASIC METABOLIC PANEL
Anion gap: 9 (ref 5–15)
BUN: 15 mg/dL (ref 6–20)
CO2: 20 mmol/L — ABNORMAL LOW (ref 22–32)
Calcium: 8.5 mg/dL — ABNORMAL LOW (ref 8.9–10.3)
Chloride: 113 mmol/L — ABNORMAL HIGH (ref 98–111)
Creatinine, Ser: 1.63 mg/dL — ABNORMAL HIGH (ref 0.44–1.00)
GFR, Estimated: 41 mL/min — ABNORMAL LOW (ref >60)
Glucose, Bld: 128 mg/dL — ABNORMAL HIGH (ref 70–99)
Potassium: 3 mmol/L — ABNORMAL LOW (ref 3.5–5.1)
Sodium: 142 mmol/L (ref 135–145)

## 2022-05-25 LAB — CBC WITH DIFFERENTIAL/PLATELET
Abs Immature Granulocytes: 0.06 10*3/uL (ref 0.00–0.07)
Basophils Absolute: 0 10*3/uL (ref 0.0–0.1)
Basophils Relative: 0 %
Eosinophils Absolute: 0.1 10*3/uL (ref 0.0–0.5)
Eosinophils Relative: 1 %
HCT: 26.7 % — ABNORMAL LOW (ref 36.0–46.0)
Hemoglobin: 9.2 g/dL — ABNORMAL LOW (ref 12.0–15.0)
Immature Granulocytes: 1 %
Lymphocytes Relative: 23 %
Lymphs Abs: 2.4 10*3/uL (ref 0.7–4.0)
MCH: 26.9 pg (ref 26.0–34.0)
MCHC: 34.5 g/dL (ref 30.0–36.0)
MCV: 78.1 fL — ABNORMAL LOW (ref 80.0–100.0)
Monocytes Absolute: 0.5 10*3/uL (ref 0.1–1.0)
Monocytes Relative: 5 %
Neutro Abs: 7.2 10*3/uL (ref 1.7–7.7)
Neutrophils Relative %: 70 %
Platelets: 79 10*3/uL — ABNORMAL LOW (ref 150–400)
RBC: 3.42 MIL/uL — ABNORMAL LOW (ref 3.87–5.11)
RDW: 18.1 % — ABNORMAL HIGH (ref 11.5–15.5)
WBC: 10.2 10*3/uL (ref 4.0–10.5)
nRBC: 0 % (ref 0.0–0.2)

## 2022-05-25 LAB — HEPATIC FUNCTION PANEL
ALT: 12 U/L (ref 0–44)
AST: 27 U/L (ref 15–41)
Albumin: 2.8 g/dL — ABNORMAL LOW (ref 3.5–5.0)
Alkaline Phosphatase: 240 U/L — ABNORMAL HIGH (ref 38–126)
Bilirubin, Direct: 1.1 mg/dL — ABNORMAL HIGH (ref 0.0–0.2)
Indirect Bilirubin: 1.2 mg/dL — ABNORMAL HIGH (ref 0.3–0.9)
Total Bilirubin: 2.3 mg/dL — ABNORMAL HIGH (ref 0.3–1.2)
Total Protein: 4.7 g/dL — ABNORMAL LOW (ref 6.5–8.1)

## 2022-05-25 LAB — ANA W/REFLEX IF POSITIVE: Anti Nuclear Antibody (ANA): NEGATIVE

## 2022-05-25 LAB — PROTIME-INR
INR: 2.9 — ABNORMAL HIGH (ref 0.8–1.2)
Prothrombin Time: 29.9 seconds — ABNORMAL HIGH (ref 11.4–15.2)

## 2022-05-25 LAB — IRON AND TIBC: Iron: 35 ug/dL (ref 28–170)

## 2022-05-25 LAB — VITAMIN D 25 HYDROXY (VIT D DEFICIENCY, FRACTURES): Vit D, 25-Hydroxy: 33.14 ng/mL (ref 30–100)

## 2022-05-25 LAB — C3 COMPLEMENT: C3 Complement: 47 mg/dL — ABNORMAL LOW (ref 82–167)

## 2022-05-25 LAB — GLUCOSE, CAPILLARY
Glucose-Capillary: 139 mg/dL — ABNORMAL HIGH (ref 70–99)
Glucose-Capillary: 106 mg/dL — ABNORMAL HIGH (ref 70–99)
Glucose-Capillary: 112 mg/dL — ABNORMAL HIGH (ref 70–99)

## 2022-05-25 LAB — RHEUMATOID FACTOR: Rheumatoid fact SerPl-aCnc: <10 IU/mL (ref <14.0)

## 2022-05-25 LAB — C4 COMPLEMENT: Complement C4, Body Fluid: 16 mg/dL (ref 12–38)

## 2022-05-25 LAB — FOLATE: Folate: 8.5 ng/mL (ref >5.9)

## 2022-05-25 LAB — FERRITIN: Ferritin: 497 ng/mL — ABNORMAL HIGH (ref 11–307)

## 2022-05-25 LAB — PHOSPHORUS: Phosphorus: 3 mg/dL (ref 2.5–4.6)

## 2022-05-25 LAB — VITAMIN B12: Vitamin B-12: 2921 pg/mL — ABNORMAL HIGH (ref 180–914)

## 2022-05-25 MED ORDER — FUROSEMIDE 10 MG/ML IJ SOLN: 40.0000 mg | Freq: Once | INTRAMUSCULAR | Status: DC

## 2022-05-25 MED ORDER — POTASSIUM CHLORIDE 10 MEQ/100ML IV SOLN
10.0000 meq | INTRAVENOUS | Status: AC
  Administered 2022-05-25 (×4): 10 meq via INTRAVENOUS
  Filled 2022-05-25 (×4): qty 100

## 2022-05-25 MED ORDER — ORAL CARE MOUTH RINSE
15.0000 mL | OROMUCOSAL | Status: DC
  Administered 2022-05-25 (×8): 15 mL via OROMUCOSAL

## 2022-05-25 MED ORDER — VITAMIN K1 10 MG/ML IJ SOLN
5.0000 mg | Freq: Once | INTRAVENOUS | Status: AC
  Administered 2022-05-25: 5 mg via INTRAVENOUS
  Filled 2022-05-25: qty 0.5

## 2022-05-25 MED ORDER — ONDANSETRON HCL 4 MG/2ML IJ SOLN: 4.0000 mg | Freq: Four times a day (QID) | INTRAMUSCULAR | Status: DC | PRN

## 2022-05-25 MED ORDER — GLYCOPYRROLATE 0.2 MG/ML IJ SOLN: 0.2000 mg | INTRAMUSCULAR | Status: DC | PRN

## 2022-05-25 MED ORDER — LORAZEPAM 2 MG/ML IJ SOLN
2.0000 mg | INTRAMUSCULAR | Status: DC | PRN
  Administered 2022-05-25: 2 mg via INTRAVENOUS
  Filled 2022-05-25: qty 1

## 2022-05-25 MED ORDER — POTASSIUM CHLORIDE CRYS ER 20 MEQ PO TBCR: 60.0000 meq | EXTENDED_RELEASE_TABLET | Freq: Once | ORAL | Status: DC

## 2022-05-25 MED ORDER — ALBUMIN HUMAN 25 % IV SOLN
12.5000 g | Freq: Three times a day (TID) | INTRAVENOUS | Status: DC
  Administered 2022-05-25: 12.5 g via INTRAVENOUS
  Filled 2022-05-25: qty 50

## 2022-05-25 MED ORDER — ACETAMINOPHEN 325 MG PO TABS: 650.0000 mg | ORAL_TABLET | Freq: Four times a day (QID) | ORAL | Status: DC | PRN

## 2022-05-25 MED ORDER — ONDANSETRON 4 MG PO TBDP: 4.0000 mg | ORAL_TABLET | Freq: Four times a day (QID) | ORAL | Status: DC | PRN

## 2022-05-25 MED ORDER — HALOPERIDOL LACTATE 5 MG/ML IJ SOLN: 2.5000 mg | INTRAMUSCULAR | Status: DC | PRN

## 2022-05-25 MED ORDER — POLYVINYL ALCOHOL 1.4 % OP SOLN: 1.0000 [drp] | Freq: Four times a day (QID) | OPHTHALMIC | Status: DC | PRN

## 2022-05-25 MED ORDER — GLYCOPYRROLATE 1 MG PO TABS: 1.0000 mg | ORAL_TABLET | ORAL | Status: DC | PRN

## 2022-05-25 MED ORDER — MORPHINE SULFATE (PF) 2 MG/ML IV SOLN
2.0000 mg | INTRAVENOUS | Status: DC | PRN
  Administered 2022-05-25: 2 mg via INTRAVENOUS
  Administered 2022-05-25: 4 mg via INTRAVENOUS
  Filled 2022-05-25: qty 1
  Filled 2022-05-25: qty 2

## 2022-05-25 MED ORDER — ACETAMINOPHEN 650 MG RE SUPP: 650.0000 mg | Freq: Four times a day (QID) | RECTAL | Status: DC | PRN

## 2022-05-25 MED ORDER — DIPHENHYDRAMINE HCL 50 MG/ML IJ SOLN: 25.0000 mg | INTRAMUSCULAR | Status: DC | PRN

## 2022-05-25 MED ORDER — SODIUM CHLORIDE 0.9 % IV SOLN: INTRAVENOUS | Status: DC

## 2022-05-26 ENCOUNTER — Ambulatory Visit: Payer: BC Managed Care – PPO | Admitting: Internal Medicine

## 2022-05-27 LAB — CULTURE, BLOOD (ROUTINE X 2)
Culture: NO GROWTH
Culture: NO GROWTH
Special Requests: ADEQUATE
Special Requests: ADEQUATE

## 2022-05-28 LAB — MISC LABCORP TEST (SEND OUT)
LabCorp test name: 1612
LabCorp test name: 791584
Labcorp test code: 1612
Labcorp test code: 791584

## 2022-05-28 LAB — COPPER, SERUM: Copper: 35 ug/dL — ABNORMAL LOW (ref 80–158)

## 2022-05-28 LAB — ZINC: Zinc: 47 ug/dL (ref 44–115)

## 2022-05-28 LAB — VITAMIN C: Vitamin C: 0.5 mg/dL (ref 0.4–2.0)

## 2022-05-29 LAB — VITAMIN B1: Vitamin B1 (Thiamine): 61.7 nmol/L — ABNORMAL LOW (ref 66.5–200.0)

## 2022-05-30 LAB — VITAMIN A: Vitamin A (Retinoic Acid): 2.7 ug/dL — ABNORMAL LOW (ref 20.1–62.0)

## 2022-05-30 LAB — VITAMIN E
Vitamin E (Alpha Tocopherol): 6 mg/L — ABNORMAL LOW (ref 7.0–25.1)
Vitamin E(Gamma Tocopherol): 1.1 mg/L (ref 0.5–5.5)

## 2022-06-04 NOTE — Progress Notes (Signed)
[facility] [place], [place] 05/11/2022  Subjective:   Hospital day # 3  Patient remains critically ill.  Brother in the room trying to feed her breakfast.  Patient remains lethargic. Renal: 08/21 0701 - 08/22 0700 In: 1730.1 [I.V.:730.1; IV Piggyback:1000] Out: 4585 [Urine:4585] Lab Results  Component Value Date   CREATININE 1.63 (H) 05/15/2022   CREATININE 1.77 (H) 05/24/2022   CREATININE 1.84 (H) 05/23/2022     Objective:  Vital signs in last 24 hours:  Temp:  [97 F (36.1 C)-99 F (37.2 C)] 97.7 F (36.5 C) (08/22 1000) Pulse Rate:  [96-117] 114 (08/22 1000) Resp:  [13-23] 18 (08/22 1000) BP: (90-120)/(68-92) 111/79 (08/22 1000) SpO2:  [84 %-97 %] 93 % (08/22 1000) FiO2 (%):  [80 %] 80 % (08/22 0700)  Weight change:  Filed Weights   2022-05-29 1151  Weight: 69.4 kg    Intake/Output:    Intake/Output Summary (Last 24 hours) at 05/15/2022 1043 Last data filed at 05/26/2022 4270 Gross per 24 hour  Intake 850 ml  Output 5700 ml  Net -4850 ml     Physical Exam: General: Critically ill-appearing, laying in the bed  HEENT Temporal muscle wasting, moist oral mucous membranes  Pulm/lungs Normal breathing effort, [place] O2  CVS/Heart No rub.  Abdomen:  Soft, nontender, nondistended  Extremities: Weeping edema  Neurologic: Lethargic             Basic Metabolic Panel:  Recent Labs  Lab 05/29/22 1450 29-May-2022 1942 05/23/22 0455 05/23/22 1648 05/24/22 0504 05/09/2022 0415  NA  --  135 139 137 139 142  K  --  2.7* 3.0* 3.3* 3.4* 3.0*  CL  --  108 109 106 111 113*  CO2  --  16* 18* 18* 19* 20*  GLUCOSE  --  324* 174* 147* 134* 128*  BUN  --  13 15 16 15 15   CREATININE  --  1.55* 1.93* 1.84* 1.77* 1.63*  CALCIUM  --  6.2* 7.7* 8.2* 8.0* 8.5*  MG 1.4* 1.6* 2.7*  --  2.4 2.1  PHOS  --  3.7 4.5  --  3.8 3.0     CBC: Recent Labs  Lab 2022-05-29 1158 2022-05-29 2036 05/23/22 0455 05/23/22 1512 05/23/22 2106 05/24/22 0504 05/05/2022 0415   WBC 7.5 6.8 8.2  --   --  8.0 10.2  NEUTROABS 5.4  --  6.2  --   --  5.2 7.2  HGB 7.7* 6.3* 6.7* 7.3* 7.8* 7.7* 9.2*  HCT 23.6* 18.1* 19.7* 21.5* 22.7* 22.5* 26.7*  MCV 81.7 78.7* 80.4  --   --  76.8* 78.1*  PLT 155 111* 110*  --   --  80* 79*      Lab Results  Component Value Date   HEPBSAG NON REACTIVE 05/23/2022   HEPBIGM NON REACTIVE 05/23/2022      Microbiology:  Recent Results (from the past 240 hour(s))  SARS Coronavirus 2 by RT PCR (hospital order, performed in [facility] hospital lab) *cepheid single result test* Anterior Nasal Swab     Status: None   Collection Time: 29-May-2022 12:08 PM   Specimen: Anterior Nasal Swab  Result Value Ref Range Status   SARS Coronavirus 2 by RT PCR NEGATIVE NEGATIVE Final    Comment: (NOTE) SARS-CoV-2 target nucleic acids are NOT DETECTED.  The SARS-CoV-2 RNA is generally detectable in upper and lower respiratory specimens during the acute phase of infection. The lowest concentration of SARS-CoV-2 viral copies this assay can detect  is 250 copies / mL. A negative result does not preclude SARS-CoV-2 infection and should not be used as the sole basis for treatment or other patient management decisions.  A negative result may occur with improper specimen collection / handling, submission of specimen other than nasopharyngeal swab, presence of viral mutation(s) within the areas targeted by this assay, and inadequate number of viral copies (<250 copies / mL). A negative result must be combined with clinical observations, patient history, and epidemiological information.  Fact Sheet for Patients:   RoadLapTop.co.za  Fact Sheet for Healthcare Providers: http://kim-miller.com/  This test is not yet approved or  cleared by the [place] FDA and has been authorized for detection and/or diagnosis of SARS-CoV-2 by FDA under an Emergency Use Authorization (EUA).  This EUA will remain in effect  (meaning this test can be used) for the duration of the COVID-19 declaration under Section 564(b)(1) of the Act, 21 U.S.C. section 360bbb-3(b)(1), unless the authorization is terminated or revoked sooner.  Performed at [facility], [address] [place], [place] [address]   Blood culture (routine x 2)     Status: None (Preliminary result)   Collection Time: 05/13/2022 12:31 PM   Specimen: BLOOD  Result Value Ref Range Status   Specimen Description BLOOD BLOOD RIGHT FOREARM  Final   Special Requests   Final    BOTTLES DRAWN AEROBIC AND ANAEROBIC Blood Culture adequate volume   Culture   Final    NO GROWTH 2 DAYS Performed at [facility], [address], [place], [place] [address]    Report Status PENDING  Incomplete  Blood culture (routine x 2)     Status: None (Preliminary result)   Collection Time: 05/07/2022 12:31 PM   Specimen: BLOOD  Result Value Ref Range Status   Specimen Description BLOOD BLOOD LEFT WRIST  Final   Special Requests   Final    BOTTLES DRAWN AEROBIC AND ANAEROBIC Blood Culture adequate volume   Culture   Final    NO GROWTH 2 DAYS Performed at [facility], [address], [place], [place] [address]    Report Status PENDING  Incomplete  Urine Culture     Status: None   Collection Time: 05/21/2022  8:36 PM   Specimen: Urine, Clean Catch  Result Value Ref Range Status   Specimen Description   Final    URINE, CLEAN CATCH Performed at [facility], [address] [place], [place] [address]    Special Requests   Final    NONE Performed at [facility], [address] [place], [place] [address]    Culture   Final    NO GROWTH Performed at [facility], [address] [place], [place] [address]    Report Status 05/24/2022 FINAL  Final    Coagulation Studies: Recent Labs    05/30/2022 1231 05/23/22 0455 05/24/22 0504 06/11/22 0415  LABPROT 18.3* 23.2* 26.0* 29.9*  INR 1.5* 2.1* 2.4* 2.9*     Urinalysis: Recent Labs    05/19/2022 2036  COLORURINE YELLOW*  LABSPEC 1.017  PHURINE 5.0  GLUCOSEU 50*  HGBUR NEGATIVE  BILIRUBINUR NEGATIVE  KETONESUR NEGATIVE  PROTEINUR NEGATIVE  NITRITE NEGATIVE  LEUKOCYTESUR NEGATIVE      Imaging: DG Chest Port 1 View  Result Date: 05/24/2022 CLINICAL DATA:  Hyperglycemia. EXAM: PORTABLE CHEST 1 VIEW COMPARISON:  05/24/2022 and CT chest 04/29/2022. FINDINGS: Trachea is midline. Heart size normal. Increasing mixed interstitial and airspace opacification with left  lower lobe collapse/consolidation. Bilateral pleural effusions. IMPRESSION: 1. Worsening mixed interstitial and airspace opacification and pleural effusions, worrisome for heart failure. 2. Left lower lobe collapse/consolidation. Difficult to exclude pneumonia. . Electronically Signed   By: Leanna Battles M.D.   On: 05/24/2022 16:02   DG Chest Port 1 View  Result Date: 05/24/2022 CLINICAL DATA:  Hypoxia. EXAM: PORTABLE CHEST 1 VIEW COMPARISON:  Chest x-ray 05/23/2022. FINDINGS: Right-sided skin fold. No visible pneumothorax. Similar left basilar opacities and low lung volumes. No definite pleural effusion. Cardiomediastinal silhouette is within normal limits. Upper abdominal clips. IMPRESSION: Similar left basilar opacities and low lung volumes. Electronically Signed   By: Feliberto Harts M.D.   On: 05/24/2022 09:17   DG Chest Port 1 View  Result Date: 05/23/2022 CLINICAL DATA:  Weakness and inability to walk EXAM: PORTABLE CHEST 1 VIEW COMPARISON:  04/29/2022 and the CTA chest of later in the day FINDINGS: Patient rotated to the right. Numerous leads and wires project over the chest. Midline trachea. Normal heart size for level of inspiration. No pleural effusion or pneumothorax. Development of bilateral, mid and lower lung predominant interstitial and airspace disease. IMPRESSION: Diminished lung volumes with development of bilateral lower lobe interstitial and airspace disease.  Favor infection/aspiration over pulmonary edema, especially given lack of pleural fluid. Electronically Signed   By: Jeronimo Greaves M.D.   On: 05/23/2022 17:24   US Abdomen Complete  Result Date: 05/23/2022 CLINICAL DATA:  Ascites due to alcoholic cirrhosis. Evaluate for portal vein thrombus. EXAM: ABDOMEN ULTRASOUND COMPLETE COMPARISON:  04/03/2022 FINDINGS: Gallbladder: Previous cholecystectomy. Common bile duct: Diameter: 4 mm. Liver: Diffuse increased parenchymal echogenicity compatible with patient's known cirrhosis. No focal mass. Portal vein is patent on color Doppler imaging with normal direction of blood flow towards the liver. No evidence of portal vein thrombosis. IVC: No abnormality visualized. Pancreas: Visualized portion unremarkable. Spleen: Size and appearance within normal limits. Right Kidney: Length: 10.6 cm. Echogenicity within normal limits. No mass or hydronephrosis visualized. Left Kidney: Length: 9.6 cm. Echogenicity within normal limits. No mass or hydronephrosis visualized. Abdominal aorta: No aneurysm visualized. Other findings: Slight interval worsening moderate ascites. IMPRESSION: 1. Evidence of patient's known cirrhosis with slight interval worsening moderate ascites. Portal vein is patent without evidence of thrombosis. 2.  Prior cholecystectomy. Electronically Signed   By: Elberta Fortis M.D.   On: 05/23/2022 12:36     Medications:    ceFEPime (MAXIPIME) IV 2 g (05/30/2022 0041)   potassium chloride     vancomycin Stopped (05/24/22 1457)    ascorbic acid  500 mg Oral BID   Chlorhexidine Gluconate Cloth  6 each Topical Daily   collagenase   Topical Daily   insulin aspart  0-9 Units Subcutaneous Q4H   lactulose  30 g Oral TID   lipase/protease/amylase  36,000 Units Oral TID AC   lithium carbonate  150 mg Oral QPM   multivitamin with minerals  1 tablet Oral Daily   mouth rinse  15 mL Mouth Rinse Q2H   Ensure Max Protein  11 oz Oral BID   rifaximin  550 mg Oral BID    sodium chloride flush  3 mL Intravenous Q12H   albuterol, fentaNYL (SUBLIMAZE) injection, lip balm, traMADol  Assessment/ Plan:  40 y.o. female with medical problems of alcoholic cirrhosis, chronic venous insufficiency with wounds, history of gastric bypass surgery, major depressive disorder, hypertension, malnutrition, ADHD , history of acute pancreatitis December 2018 admitted on 05/27/22 for SIRS (systemic inflammatory response syndrome) (HCC) [R65.10]  AKI (acute kidney injury) (HCC) [N17.9] Hypothermia, initial encounter [T68.XXXA] Sepsis due to cellulitis (HCC) [L03.90, A41.9]  #Acute kidney injury #Decompensated cirrhosis with volume overload / Anasarca and weeping edema   Patient with alcoholic cirrhosis.  She stopped drinking alcohol about 5 months ago.  At present getting rifaximin  Lasix and spironolactone on hold.   Case d/w GI team. Agree with IV albumin supplementation for oncotic support.  If the blood pressure allows, may use IV furosemide.   IV furosemide infusion can also be considered. Overall prognosis appears poor. Palliative care evaluation in progress.     LOS: 3 Traylen Eckels Thedore Mins 2023-06-909:43 AM  [facility] [place], [place] [phone]  Note: This note was prepared with Dragon dictation. Any transcription errors are unintentional

## 2022-06-04 NOTE — Plan of Care (Signed)
       CROSS COVER NOTE  NAME: Stephanie Levy MRN: [id number] DOB : 1980-11-30    Date of Service   06/08/2022  HPI/Events of Note   Informed by nursing that patient passed and pronounced at 2050  Assessment and  Interventions   Assessment:  Plan: We will pass on to primary attending X X

## 2022-06-04 NOTE — Plan of Care (Signed)
Patient is suffering and dying process PALLIATIVE CARE CONSULTED  Patient is DNR/DNI, no HD no pressors  PCCM to sign off this time

## 2022-06-04 NOTE — Consult Note (Addendum)
Consultation Note Date: 06-05-22   Patient Name: Stephanie Levy  DOB: 04/25/1981  MRN: [id number]  Age / Sex: 41 y.o., female  PCP: Konrad Saha, MD Referring Physician: Gwynne Edinger, MD  Reason for Consultation: Establishing goals of care  HPI/Patient Profile: 41 y.o. 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   Patient is being followed by GI and nephrology.  Palliative medicine team was consulted to discuss goals of care.  Clinical Assessment and Goals of Care: I have reviewed medical records including EPIC notes, labs and imaging, assessed the patient and then met with patient, her brother Arnette Norris, and her spouse Elmyra Ricks at bedside to discuss diagnosis prognosis, GOC, EOL wishes, disposition and options.  I introduced Palliative Medicine as specialized medical care for people living with serious illness. It focuses on providing relief from the symptoms and stress of a serious illness. The goal is to improve quality of life for both the patient and the family.  We discussed a brief life review of the patient.  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  She and her wife Elmyra Ricks share 3 adopted young children.  Brother shares patient is stubborn and extremely bright.  We discussed patient's current illness and what it means in the larger context of patient's on-going co-morbidities. Education provided on ESLD, kidney function, fluid balance, paracentesis, hepatic encephalopathy, malnutrition, and functional ability. Wife NIcole would like medical update directly from attending. Attending made aware.  I attempted to elicit  values and goals of care important to the patient.  Wife Elmyra Ricks states "I want her here but I don't want her to be in pain or suffer". Family asked appropriate questions regarding CPR, intubation, and code status. Discussed that in the event that the patient's heart and lungs stop the cause of arrest is likely associated with advanced chronic illness rather than an easily reversible acute cardio-pulmonary event.    Discussed doing things "to" rather than "for" patient. The difference between aggressive medical intervention and comfort care was considered in light of the patient's goals of care. Comfort measures described in detail.   Education offered regarding concept specific to human mortality and the limitations of medical interventions to prolong life when the body begins to fail to thrive. Reviewed patient has poor prognosis given current functional, nutritional, and cognitive status.    Discussed with family the importance of continued conversation with family and the medical providers regarding overall plan of care and treatment options, ensuring decisions are within the context of the patient's values and GOCs.    Questions and concerns were addressed. The family was encouraged to call with questions or concerns.  PMT will continue to follow the patient and support this loving family throughout her hospitalization.   Primary Decision Maker NEXT OF KIN  Code  Status/Advance Care Planning: DNR  Primary Diagnoses: Present on Admission:  SIRS (systemic inflammatory response syndrome) (HCC)  ADHD  Alcoholic cirrhosis of liver with ascites (HCC)  Bilateral lower extremity edema  Depression with anxiety  Elevated lactic acid level  HTN (hypertension)  Hypomagnesemia  Normocytic anemia  Protein-calorie malnutrition, severe  Pressure injury of skin  Pancreatic lesion  Alcohol abuse  Chronic pain syndrome   Physical Exam Vitals reviewed.  Constitutional:      General: She is not  in acute distress.    Appearance: She is ill-appearing. She is not toxic-appearing.  HENT:     Head: Normocephalic.     Comments: Temporal wasting    Mouth/Throat:     Comments: Mild edema of lips, cracking with dried blood present Pulmonary:     Effort: Pulmonary effort is normal.     Comments: HFNC in place Abdominal:     Palpations: Abdomen is soft.  Musculoskeletal:     Comments: Generalized weakness, does not MAETC  Skin:    Comments: Cool to touch, bare hugger in place  Neurological:     Comments: nonverbal     Palliative Assessment/Data: 20%     Thank you for this consult. Palliative medicine will continue to follow and assist holistically.   Time Total: 75 minutes Greater than 50%  of this time was spent counseling and coordinating care related to the above assessment and plan.  Signed by: Jordan Hawks, DNP, FNP-BC Palliative Medicine    Please contact Palliative Medicine Team phone at [phone] for questions and concerns.  For individual provider: See Shea Evans

## 2022-06-04 NOTE — Progress Notes (Signed)
Inpatient Follow-up/Progress Note   Patient ID: Stephanie Levy is a 41 y.o. female.  Overnight Events / Subjective Findings Patient continues to clinically deteriorate.  Her brother was able to come into town overnight and is at bedside. Still requiring HFNC with lower o2 sats.  She does not appear comfortable.  Extensive discussion held with brother outside of the patient's room regarding disease process, clinical deterioration and prognosis Creatinine remains elevated, but stable as does lactate.  Hemoglobin stable at 7.7 but patient remains tachycardic with systolic blood pressures in the 90s.  Coagulopathy continues to worsen.  She was changed to DNR/DNI yesterday.  Discussed with nephrology and palliative care teams  Review of Systems  Unable to perform ROS: Mental status change     Medications  Current Facility-Administered Medications:    albuterol (PROVENTIL) (2.5 MG/3ML) 0.083% nebulizer solution 3 mL, 3 mL, Inhalation, Daily PRN, Sid Falcon, MD   ascorbic acid (VITAMIN C) tablet 500 mg, 500 mg, Oral, BID, Rust-Chester, Britton L, NP, 500 mg at 05/24/22 2051   ceFEPIme (MAXIPIME) 2 g in sodium chloride 0.9 % 100 mL IVPB, 2 g, Intravenous, Q12H, Gilles Chiquito B, MD, Last Rate: 200 mL/hr at May 31, 2022 0041, 2 g at 05/31/22 0041   Chlorhexidine Gluconate Cloth 2 % PADS 6 each, 6 each, Topical, Daily, Sid Falcon, MD, 6 each at May 31, 2022 0930   collagenase (SANTYL) ointment, , Topical, Daily, Wouk, Ailene Rud, MD, Given at 31-May-2022 1056   fentaNYL (SUBLIMAZE) injection 12.5-50 mcg, 12.5-50 mcg, Intravenous, Q2H PRN, Gilles Chiquito B, MD, 50 mcg at May 31, 2022 0039   insulin aspart (novoLOG) injection 0-9 Units, 0-9 Units, Subcutaneous, Q4H, Rust-Chester, Britton L, NP, 1 Units at 31-May-2022 0438   lactulose (CHRONULAC) 10 GM/15ML solution 30 g, 30 g, Oral, TID, Rust-Chester, Britton L, NP, 30 g at 05/24/22 2051   lip balm (BLISTEX) ointment, , Topical, PRN, Darrick Penna,  [facility], Given at 05/24/22 0750   lipase/protease/amylase (CREON) capsule 36,000 Units, 36,000 Units, Oral, TID Kris Mouton, MD, 36,000 Units at 05/24/22 1628   lithium carbonate capsule 150 mg, 150 mg, Oral, QPM, Rust-Chester, Britton L, NP, 150 mg at 05/24/22 1629   multivitamin with minerals tablet 1 tablet, 1 tablet, Oral, Daily, Rust-Chester, Huel Cote, NP   Oral care mouth rinse, 15 mL, Mouth Rinse, Q2H, Wouk, Ailene Rud, MD, 15 mL at 05/31/2022 1056   potassium chloride 10 mEq in 100 mL IVPB, 10 mEq, Intravenous, Q1 Hr x 4, Wouk, Ailene Rud, MD, Last Rate: 100 mL/hr at May 31, 2022 1057, 10 mEq at May 31, 2022 1057   protein supplement (ENSURE MAX) liquid, 11 oz, Oral, BID, Wouk, Ailene Rud, MD, 11 oz at 05/24/22 2233   rifaximin (XIFAXAN) tablet 550 mg, 550 mg, Oral, BID, Annamaria Helling, DO, 550 mg at 05/24/22 2052   sodium chloride flush (NS) 0.9 % injection 3 mL, 3 mL, Intravenous, Q12H, Gilles Chiquito B, MD, 3 mL at 2022-05-31 1056   traMADol (ULTRAM) tablet 50 mg, 50 mg, Oral, Q12H PRN, Rust-Chester, Britton L, NP, 50 mg at 05/24/22 2052   vancomycin (VANCOCIN) IVPB 1000 mg/200 mL premix, 1,000 mg, Intravenous, Q24H, Nazari, Walid A, RPH, Stopped at 05/24/22 1457  ceFEPime (MAXIPIME) IV 2 g (05-31-22 0041)   potassium chloride 10 mEq (05/31/2022 1057)   vancomycin Stopped (05/24/22 1457)    albuterol, fentaNYL (SUBLIMAZE) injection, lip balm, traMADol   Objective    Vitals:   05/31/22 0700 May 31, 2022 0800 2022-05-31 0900 May 31, 2022 1000  BP: 97/75 97/69 103/76 111/79  Pulse: (!) 113 (!) 108 (!) 114 (!) 114  Resp: _0 Temp: 98.2 F (36.8 C) 98.2 F (36.8 C) 98.1 F (36.7 C) 97.7 F (36.5 C)  TempSrc: Bladder     SpO2: 92% 94% 91% 93%  Weight:      Height:         Physical Exam Vitals and nursing note reviewed.  Constitutional:      General: She is in acute distress.     Appearance: She is ill-appearing and toxic-appearing. She is not diaphoretic.      Comments: Critically ill-appearing, appears older than stated age, cachectic  HENT:     Head: Atraumatic.     Comments: Bitemporal wasting    Nose: Nose normal.     Mouth/Throat:     Mouth: Mucous membranes are dry.     Pharynx: Oropharynx is clear.  Eyes:     General: Scleral icterus present.     Extraocular Movements: Extraocular movements intact.  Cardiovascular:     Rate and Rhythm: Regular rhythm. Tachycardia present.  Pulmonary:     Comments: Remains on HFNC Musculoskeletal:     Comments: Lower extremities bandaged  Neurological:     Mental Status: She is alert.     Comments: Confusion present.  Patient has difficulty finding words to describe what she is trying to say      Laboratory Data Recent Labs  Lab 05/23/22 0455 05/23/22 1512 05/23/22 2106 05/24/22 0504 Jun 12, 2022 0415  WBC 8.2  --   --  8.0 10.2  HGB 6.7*   < > 7.8* 7.7* 9.2*  HCT 19.7*   < > 22.7* 22.5* 26.7*  PLT 110*  --   --  80* 79*  NEUTOPHILPCT 77  --   --  66 70  LYMPHOPCT 20  --   --  29 23  MONOPCT 3  --   --  5 5  EOSPCT 0  --   --  0 1   < > = values in this interval not displayed.    Recent Labs  Lab 05/20/2022 1158 05/08/2022 1942 05/23/22 0455 05/23/22 1648 05/24/22 0504 June 12, 2022 0415  NA 133*   < > 139 137 139 142  K 3.1*   < > 3.0* 3.3* 3.4* 3.0*  CL 101   < > 109 106 111 113*  CO2 17*   < > 18* 18* 19* 20*  BUN 16   < > _1 CREATININE 2.04*   < > 1.93* 1.84* 1.77* 1.63*  CALCIUM 7.5*   < > 7.7* 8.2* 8.0* 8.5*  PROT 5.0*  --  4.7*  --   --  4.7*  BILITOT 1.7*  --  1.7*  --   --  2.3*  ALKPHOS 473*  --  317*  --   --  240*  ALT 32  --  21  --   --  12  AST 36  --  32  --   --  27  GLUCOSE 386*   < > 174* 147* 134* 128*   < > = values in this interval not displayed.    Recent Labs  Lab 05/23/22 0455 05/24/22 0504 06-12-22 0415  INR 2.1* 2.4* 2.9*       Imaging Studies: DG Chest Port 1 View  Result Date: 05/24/2022 CLINICAL DATA:  Hyperglycemia. EXAM:  PORTABLE CHEST 1 VIEW COMPARISON:  05/24/2022 and CT chest 04/29/2022. FINDINGS:  Trachea is midline. Heart size normal. Increasing mixed interstitial and airspace opacification with left lower lobe collapse/consolidation. Bilateral pleural effusions. IMPRESSION: 1. Worsening mixed interstitial and airspace opacification and pleural effusions, worrisome for heart failure. 2. Left lower lobe collapse/consolidation. Difficult to exclude pneumonia. . Electronically Signed   By: Lorin Picket M.D.   On: 05/24/2022 16:02   DG Chest Port 1 View  Result Date: 05/24/2022 CLINICAL DATA:  Hypoxia. EXAM: PORTABLE CHEST 1 VIEW COMPARISON:  Chest x-ray 05/23/2022. FINDINGS: Right-sided skin fold. No visible pneumothorax. Similar left basilar opacities and low lung volumes. No definite pleural effusion. Cardiomediastinal silhouette is within normal limits. Upper abdominal clips. IMPRESSION: Similar left basilar opacities and low lung volumes. Electronically Signed   By: Margaretha Sheffield M.D.   On: 05/24/2022 09:17   DG Chest Port 1 View  Result Date: 05/23/2022 CLINICAL DATA:  Weakness and inability to walk EXAM: PORTABLE CHEST 1 VIEW COMPARISON:  04/29/2022 and the CTA chest of later in the day FINDINGS: Patient rotated to the right. Numerous leads and wires project over the chest. Midline trachea. Normal heart size for level of inspiration. No pleural effusion or pneumothorax. Development of bilateral, mid and lower lung predominant interstitial and airspace disease. IMPRESSION: Diminished lung volumes with development of bilateral lower lobe interstitial and airspace disease. Favor infection/aspiration over pulmonary edema, especially given lack of pleural fluid. Electronically Signed   By: Abigail Miyamoto M.D.   On: 05/23/2022 17:24   US Abdomen Complete  Result Date: 05/23/2022 CLINICAL DATA:  Ascites due to alcoholic cirrhosis. Evaluate for portal vein thrombus. EXAM: ABDOMEN ULTRASOUND COMPLETE COMPARISON:   04/03/2022 FINDINGS: Gallbladder: Previous cholecystectomy. Common bile duct: Diameter: 4 mm. Liver: Diffuse increased parenchymal echogenicity compatible with patient's known cirrhosis. No focal mass. Portal vein is patent on color Doppler imaging with normal direction of blood flow towards the liver. No evidence of portal vein thrombosis. IVC: No abnormality visualized. Pancreas: Visualized portion unremarkable. Spleen: Size and appearance within normal limits. Right Kidney: Length: 10.6 cm. Echogenicity within normal limits. No mass or hydronephrosis visualized. Left Kidney: Length: 9.6 cm. Echogenicity within normal limits. No mass or hydronephrosis visualized. Abdominal aorta: No aneurysm visualized. Other findings: Slight interval worsening moderate ascites. IMPRESSION: 1. Evidence of patient's known cirrhosis with slight interval worsening moderate ascites. Portal vein is patent without evidence of thrombosis. 2.  Prior cholecystectomy. Electronically Signed   By: Marin Olp M.D.   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 and may have SBP which would move her into decompensation realm  -Sobriety per report since May 2023 -Seen in GI office on August 8 -Taking Lasix 40 mg daily and spironolactone 50 mg daily- currently held due to renal dysfunction and hypotension - MELD-Na 26; CP-C (as of 8/22)  # Portal hypertension -coagulopathy, hyperbilirubinemia, hyponatremia, thrombocytopenia, hypoalbuminemia, encephalopathy, ascites   # Normocytic anemia w/o signs of GIB   # generalized debility # bilateral leg pain # DM2 with initial presentation with DKA   # Chronic pancreatitis -On home Creon- 36K 2-3 capsules with meal and 1-2 with snacks - pancreatic lesion with elevated ca19-9 (206)   # Multiple electrolyte derangement -Hyponatremia hypokalemia hypomagnesemia   # Cryptogenic skin  lesions/findings on bilateral lower extremities with edema   #Severe protein calorie malnutrition #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 Pt is also declining oral medications at times She was changed to DNR/DNI yesterday Brother has arrived in town. Extensive conversation held with him regarding her disease, clinical course and prognosis Palliative care is also following Coagulopathy continues to worsen. Provided dose of vitamin k Today.  ANA negative; c3 low, but c4 normal Discussed with nephrology. Would like to continue 1 more day of albumin  -Paracentesis pending, but pt stability may prevent. At most would do diagnostic -Waxing and waning mentation -Continue lactulose to goal of 3 bm per day; do not need to trend serum ammonia, but follow patient clinically for encephalopathy -xifaxin 52m bid  -Monitor mental status  - continue home creon -Monitor H&H.  Transfusion and resuscitation as per primary team. Monitor for GIB -Continue IV fluid resuscitation given elevated lactate patient will take longer to clear given cirrhosis -P.o. as tolerated with high-protein low-sodium - diuretics currently on hold given renal injury and hyoptension -Avoid hepatotoxic, nephrotoxic and sedating agents - currently on heparin dvt ppx - consider addition of daily ppi - on vancomycin and cefepime as per primary team  -Check alk phos isoenzyme and phosphatidyl ethanol (peth) - viral hepatitis negative -UDS positive for amphetamines, however, Adderall filled on March 18, 2022  - follow daily cmp, cbc, inr   -Glucose control and electrolyte correction as per primary team -Supportive care as per primary team  -Palliative care following. Pt is appropriate for hospice at this time if family is agreeable as overall prognosis is poor given  decompensation   I personally performed the service.  Management of other medical comorbidities as per primary team  Thank you for allowing [facility] participate in this patient's care. Please don't hesitate to call if any questions or concerns arise.   SAnnamaria Helling DO [facility]  Portions of the record may have been created with voice recognition software. Occasional wrong-word or 'sound-a-like' substitutions may have occurred due to the inherent limitations of voice recognition software.  Read the chart carefully and recognize, using context, where substitutions may have occurred.

## 2022-06-04 NOTE — Progress Notes (Addendum)
PROGRESS NOTE    Stephanie Levy  MQK:[id number] DOB: 28-Jul-1981 DOA: 05/26/2022 PCP: Katrinka Blazing, MD  Outpatient Specialists: GI, wound care    Brief Narrative:  From admission h and p Stephanie Levy is a 41 y.o. female with medical history significant of AUD (in remission), alcoholic cirrhosis, chronic venous insufficiency with wounds, obesity s/p Gastric bypass surgery, MDD, HTN, malnutrition, ADHD who presents for worsening pain in the legs, weeping leg wounds and difficulty standing.  Stephanie Levy reports that since leaving the hospital in late July, she has had a slow decline in strength.  She notes that her legs will not work to move her from a sitting to standing position, however, once she is up, she can ambulate okay.  She feels that her thighs are weak and she concerned with falling.  She has persistent lesions on the legs which can be seen in photos in care everywhere.  She feels like they are worsening.  She has had weeping of clear fluids and pain.  She has gained weight up from 120# in May of this year and notes she is 153# today.  She is taking her diuretics and notes that she was recently increased in her lasix and spironolactone to 40mg  and 50mg  daily, but she feels her abdomen is actually bigger.  She has had no fever, chills, no nausea or vomiting.  She feels that she is eating okay at home.  She notes no change in mentation, no confusion, no pain with urination, no abdominal pain.    Assessment & Plan:   Principal Problem:   SIRS (systemic inflammatory response syndrome) (HCC) Active Problems:   Skin lesions   Alcohol abuse   HTN (hypertension)   Alcoholic cirrhosis of liver with ascites (HCC)   Depression with anxiety   ADHD   Diabetes mellitus without complication (HCC)   Normocytic anemia   Pancreatic lesion   Protein-calorie malnutrition, severe   Elevated lactic acid level   Hypomagnesemia   Bilateral lower extremity edema   Pressure injury of skin   Chronic  pain syndrome   History of gastric bypass   Neuropathy  ADDENDUM 06/18/2022 17:30 Family desires to proceed with comfort care. Comfort care orders placed  ------------------------------------------------  # Cirrhosis, end-stage # Ascites GI following. History alcoholism. No overt signs bleeding. Meldna 23 on arrival. U/s with patent portal vein, moderate ascites. No known varices but I do not see that she has ever had EGD. Critically ill with worsening liver failure. GI and PCCM following, prognosis extremely poor, patient is in the dying process - holding home oral lasix/spiro - iv lasix, continue - vitamin k today for coagulopathy - continue lactulose and titrate to 2-3 bm/day - rifaximin started by GI - continue albumin - paracentesis pending but patient not stable to transfer for that  # Anemia Hgb 7.7>6.3>6.7>7.3>7.7>9.2, has received 2 units. No overt signs of bleeding - monitor and trend, transfuse as needed, GI is following - trend coags - pharmacologic dvt ppx on hold  # Critical illness In the setting of severe incurable chronic disease - palliative consulted - DNR/DNI, no pressors  # SIRS # Elevated lactic acid Presents altered, hypothermic, with markedly elevated lactic acid. Has chronic lower extremity wounds but they do not appear infected. Covid negative. Possibly all related to oncotic pressure. CXR with basilar opacities - blood and urine cultures drawn and ngtd - cont empiric vanc/cefepime - continue  albumin - continue empiric vanc/cefepime  # Acute kidney injury # Oliguria  Baseline normal kidney function. 2.04 on presentation, 1.63 this morning. Urinalysis unremarkable. Possible hepatorenal. Was oliguric until we started diuresis - trend - continue albumin - nephrology following - maintain foley  # Acute hypoxic respiratory failure No dyspnea but worsening O2 this morning, likely fluid overload from fluid resuscitation, poor oncotic pressure - [place]  O2 - lasix and albumin  # Hyperglycemia Presented hyperglycemic, now much improved with q4 SSI - continue SSI  # Hypokalemia K 3.0 this morning - replete and monitor  # Hypomagnesmia Normalized after repletion - monitor  # Encephalopathy CT head negative. Worsening with liver failure  # Lower extremity wounds Has been followed by wound care clinic. Etiology unclear, perhaps secondary to chronic edema. Biopsy last month indeterminate, no signs vasculitis. Do not appear to be infected - wound care following  # Chronic pancreatitis with pancreatic insufficiency - cont home creon  # Severe malnutrition - RD consulted  # History alcoholism Reportedly no recent alcohol (stopped 02/2022) here no signs of withdrawal - monitor for signs of withdrawal  # ADHD Amphetamines in urine, denies illicit use, says prescribed aderall at home  # MDD Lithium level undetectable - holding lithium given aki      DVT prophylaxis: SCDs Code Status: full Family Communication: spouse [name] updated at bedside 8/22  Level of care: Stepdown Status is: Inpatient Remains inpatient appropriate because: severity of illness    Consultants:  PCCM, GI, palliative  Procedures: pending  Antimicrobials:  Vanc/cefepime    Subjective: This morning asleep, not communicative  Objective: Vitals:   05/26/2022 1200 06/03/2022 1300 05/28/2022 1400 05/31/2022 1500  BP: 112/68 103/68 94/70 102/69  Pulse: (!) 118 (!) 125 (!) 125 (!) 121  Resp: 18 19 18  (!) 21  Temp: 98.4 F (36.9 C) 98.8 F (37.1 C) 98.8 F (37.1 C) 98.4 F (36.9 C)  TempSrc: Bladder     SpO2: 92% (!) 89% (!) 86% 93%  Weight:      Height:        Intake/Output Summary (Last 24 hours) at 05/15/2022 1644 Last data filed at 05/18/2022 1505 Gross per 24 hour  Intake 650 ml  Output 4135 ml  Net -3485 ml   Filed Weights   05-23-2022 1151  Weight: 69.4 kg    Examination:  General exam: asleep, minimally responsive,  chronically ill appearing Respiratory system: normal rate. Rales at bases Cardiovascular system: S1 & S2 heard, RRR. No JVD, murmurs, rubs, gallops or clicks.  Gastrointestinal system: Abdomen is mildly distended, soft and nontender.  Central nervous system: asleep Extremities: pitting edema to abdomen Skin:    Psychiatry: calm    Data Reviewed: I have personally reviewed following labs and imaging studies  CBC: Recent Labs  Lab 05-23-22 1158 23-May-2022 2036 05/23/22 0455 05/23/22 1512 05/23/22 2106 05/24/22 0504 05/11/2022 0415  WBC 7.5 6.8 8.2  --   --  8.0 10.2  NEUTROABS 5.4  --  6.2  --   --  5.2 7.2  HGB 7.7* 6.3* 6.7* 7.3* 7.8* 7.7* 9.2*  HCT 23.6* 18.1* 19.7* 21.5* 22.7* 22.5* 26.7*  MCV 81.7 78.7* 80.4  --   --  76.8* 78.1*  PLT 155 111* 110*  --   --  80* 79*   Basic Metabolic Panel: Recent Labs  Lab 05/23/22 1450 05-23-22 1942 05/23/22 0455 05/23/22 1648 05/24/22 0504 05/23/2022 0415  NA  --  135 139 137 139 142  K  --  2.7* 3.0* 3.3* 3.4* 3.0*  CL  --  108 109 106 111 113*  CO2  --  16* 18* 18* 19* 20*  GLUCOSE  --  324* 174* 147* 134* 128*  BUN  --  13 15 16 15 15   CREATININE  --  1.55* 1.93* 1.84* 1.77* 1.63*  CALCIUM  --  6.2* 7.7* 8.2* 8.0* 8.5*  MG 1.4* 1.6* 2.7*  --  2.4 2.1  PHOS  --  3.7 4.5  --  3.8 3.0   GFR: Estimated Creatinine Clearance: 46.3 mL/min (A) (by C-G formula based on SCr of 1.63 mg/dL (H)). Liver Function Tests: Recent Labs  Lab 2022-06-14 1158 05/23/22 0455 05/29/2022 0415  AST 36 32 27  ALT 32 21 12  ALKPHOS 473* 317* 240*  BILITOT 1.7* 1.7* 2.3*  PROT 5.0* 4.7* 4.7*  ALBUMIN <1.5* 2.2* 2.8*   Recent Labs  Lab 06/14/2022 1158  LIPASE 16   Recent Labs  Lab 06/14/2022 1158 05/23/22 0455  AMMONIA 95* 66*   Coagulation Profile: Recent Labs  Lab 06/14/2022 1231 05/23/22 0455 05/24/22 0504 05/05/2022 0415  INR 1.5* 2.1* 2.4* 2.9*   Cardiac Enzymes: Recent Labs  Lab June 14, 2022 1158 05/23/22 1127  CKTOTAL 66 26*    BNP (last 3 results) No results for input(s): "PROBNP" in the last 8760 hours. HbA1C: No results for input(s): "HGBA1C" in the last 72 hours. CBG: Recent Labs  Lab 05/24/22 1959 05/24/22 2312 06/02/2022 0344 05/28/2022 0717 05/13/2022 1132  GLUCAP 182* 159* 139* 106* 112*   Lipid Profile: No results for input(s): "CHOL", "HDL", "LDLCALC", "TRIG", "CHOLHDL", "LDLDIRECT" in the last 72 hours. Thyroid Function Tests: No results for input(s): "TSH", "T4TOTAL", "FREET4", "T3FREE", "THYROIDAB" in the last 72 hours.  Anemia Panel: Recent Labs    05/20/2022 0415  VITAMINB12 2,921*  FOLATE 8.5  FERRITIN 497*  TIBC NOT CALCULATED  IRON 35   Urine analysis:    Component Value Date/Time   COLORURINE YELLOW (A) 06-14-22 2036   APPEARANCEUR HAZY (A) 06-14-2022 2036   LABSPEC 1.017 06/14/2022 2036   PHURINE 5.0 2022/06/14 2036   GLUCOSEU 50 (A) 06-14-22 2036   HGBUR NEGATIVE 06-14-2022 2036   BILIRUBINUR NEGATIVE Jun 14, 2022 2036   KETONESUR NEGATIVE 14-Jun-2022 2036   PROTEINUR NEGATIVE June 14, 2022 2036   NITRITE NEGATIVE 06/14/22 2036   LEUKOCYTESUR NEGATIVE 2022/06/14 2036   Sepsis Labs: @LABRCNTIP (procalcitonin:4,lacticidven:4)  ) Recent Results (from the past 240 hour(s))  SARS Coronavirus 2 by RT PCR (hospital order, performed in [facility] hospital lab) *cepheid single result test* Anterior Nasal Swab     Status: None   Collection Time: 14-Jun-2022 12:08 PM   Specimen: Anterior Nasal Swab  Result Value Ref Range Status   SARS Coronavirus 2 by RT PCR NEGATIVE NEGATIVE Final    Comment: (NOTE) SARS-CoV-2 target nucleic acids are NOT DETECTED.  The SARS-CoV-2 RNA is generally detectable in upper and lower respiratory specimens during the acute phase of infection. The lowest concentration of SARS-CoV-2 viral copies this assay can detect is 250 copies / mL. A negative result does not preclude SARS-CoV-2 infection and should not be used as the sole basis for treatment or  other patient management decisions.  A negative result may occur with improper specimen collection / handling, submission of specimen other than nasopharyngeal swab, presence of viral mutation(s) within the areas targeted by this assay, and inadequate number of viral copies (<250 copies / mL). A negative result must be combined with clinical observations, patient history, and epidemiological information.  Fact Sheet for Patients:   [facility]  Fact Sheet for Healthcare Providers: http://kim-miller.com/  This test is not yet approved or  cleared by the [place] FDA and has been authorized for detection and/or diagnosis of SARS-CoV-2 by FDA under an Emergency Use Authorization (EUA).  This EUA will remain in effect (meaning this test can be used) for the duration of the COVID-19 declaration under Section 564(b)(1) of the Act, 21 U.S.C. section 360bbb-3(b)(1), unless the authorization is terminated or revoked sooner.  Performed at [facility], [address] [place], [place] [address]   Blood culture (routine x 2)     Status: None (Preliminary result)   Collection Time: 06/02/2022 12:31 PM   Specimen: BLOOD  Result Value Ref Range Status   Specimen Description BLOOD BLOOD RIGHT FOREARM  Final   Special Requests   Final    BOTTLES DRAWN AEROBIC AND ANAEROBIC Blood Culture adequate volume   Culture   Final    NO GROWTH 2 DAYS Performed at [facility], [address] [place], [place] [address]    Report Status PENDING  Incomplete  Blood culture (routine x 2)     Status: None (Preliminary result)   Collection Time: Jun 02, 2022 12:31 PM   Specimen: BLOOD  Result Value Ref Range Status   Specimen Description BLOOD BLOOD LEFT WRIST  Final   Special Requests   Final    BOTTLES DRAWN AEROBIC AND ANAEROBIC Blood Culture adequate volume   Culture   Final    NO GROWTH 2 DAYS Performed at [facility],  [address], [place], [place] [address]    Report Status PENDING  Incomplete  Urine Culture     Status: None   Collection Time: June 02, 2022  8:36 PM   Specimen: Urine, Clean Catch  Result Value Ref Range Status   Specimen Description   Final    URINE, CLEAN CATCH Performed at [facility], [address], [place], [place] [address]    Special Requests   Final    NONE Performed at [facility], [address] [place], [place] [address]    Culture   Final    NO GROWTH Performed at [facility], [address], [place], [place] [address]    Report Status 05/24/2022 FINAL  Final         Radiology Studies: DG Chest Port 1 View  Result Date: 05/24/2022 CLINICAL DATA:  Hyperglycemia. EXAM: PORTABLE CHEST 1 VIEW COMPARISON:  05/24/2022 and CT chest 04/29/2022. FINDINGS: Trachea is midline. Heart size normal. Increasing mixed interstitial and airspace opacification with left lower lobe collapse/consolidation. Bilateral pleural effusions. IMPRESSION: 1. Worsening mixed interstitial and airspace opacification and pleural effusions, worrisome for heart failure. 2. Left lower lobe collapse/consolidation. Difficult to exclude pneumonia. . Electronically Signed   By: Leanna Battles M.D.   On: 05/24/2022 16:02   DG Chest Port 1 View  Result Date: 05/24/2022 CLINICAL DATA:  Hypoxia. EXAM: PORTABLE CHEST 1 VIEW COMPARISON:  Chest x-ray 05/23/2022. FINDINGS: Right-sided skin fold. No visible pneumothorax. Similar left basilar opacities and low lung volumes. No definite pleural effusion. Cardiomediastinal silhouette is within normal limits. Upper abdominal clips. IMPRESSION: Similar left basilar opacities and low lung volumes. Electronically Signed   By: Feliberto Harts M.D.   On: 05/24/2022 09:17   DG Chest Port 1 View  Result Date: 05/23/2022 CLINICAL DATA:  Weakness and inability to walk EXAM: PORTABLE CHEST 1 VIEW COMPARISON:  04/29/2022 and the CTA chest of later  in the day FINDINGS: Patient  rotated to the right. Numerous leads and wires project over the chest. Midline trachea. Normal heart size for level of inspiration. No pleural effusion or pneumothorax. Development of bilateral, mid and lower lung predominant interstitial and airspace disease. IMPRESSION: Diminished lung volumes with development of bilateral lower lobe interstitial and airspace disease. Favor infection/aspiration over pulmonary edema, especially given lack of pleural fluid. Electronically Signed   By: Jeronimo Greaves M.D.   On: 05/23/2022 17:24        Scheduled Meds:  ascorbic acid  500 mg Oral BID   Chlorhexidine Gluconate Cloth  6 each Topical Daily   collagenase   Topical Daily   insulin aspart  0-9 Units Subcutaneous Q4H   lactulose  30 g Oral TID   lipase/protease/amylase  36,000 Units Oral TID AC   lithium carbonate  150 mg Oral QPM   multivitamin with minerals  1 tablet Oral Daily   mouth rinse  15 mL Mouth Rinse Q2H   Ensure Max Protein  11 oz Oral BID   rifaximin  550 mg Oral BID   sodium chloride flush  3 mL Intravenous Q12H   Continuous Infusions:  albumin human 12.5 g (05/09/2022 1522)   ceFEPime (MAXIPIME) IV Stopped (06/02/2022 1306)   vancomycin Stopped (05/08/2022 1505)     LOS: 3 days     Silvano Bilis, MD [facility]   If 7PM-7AM, please contact night-coverage [email or web address] Password [facility] 05/30/2022, 4:44 PM

## 2022-06-04 NOTE — Progress Notes (Signed)
Patient slept through the night, awakened minimally for vital signs, turning and repositioning. Patient's brother Aneta Mins arrived from [place] and stayed the night with patient. The brother had several questions and the NP was able to speak with him. Safety maintained. Call light kept within reach.

## 2022-06-04 NOTE — Progress Notes (Signed)
I responded to a page from the nurse to provide spiritual support for the patient's family. I arrived at the patient's room where her wife, daughter, brother, and friends were present. I provided spiritual support through pastoral presence, by reading scripture, and leading in prayer.    06/03/2022 2034  Clinical Encounter Type  Visited With Patient and family together  Visit Type Initial;Spiritual support  Referral From Nurse  Consult/Referral To Chaplain  Spiritual Encounters  Spiritual Needs [facility] text;Prayer;Emotional    Chaplain Dr Melvyn Novas

## 2022-06-04 NOTE — Death Summary Note (Signed)
DEATH SUMMARY   Patient Details  Name: Stephanie Levy MRN: [id number] DOB: [birth year]  Admission/Discharge Information   Admit Date:  2022-05-27  Date of Death: Date of Death: 05/30/22  Time of Death: Time of Death: 01/13/2049  Length of Stay: 3  Referring Physician: Katrinka Blazing, MD   Reason(s) for Hospitalization  Decompensated cirrhosis, SIRS  Diagnoses  Preliminary cause of death:  Secondary Diagnoses (including complications and co-morbidities):  Principal Problem:   SIRS (systemic inflammatory response syndrome) (HCC) Active Problems:   HTN (hypertension)   Alcohol abuse   Skin lesions   Alcoholic cirrhosis of liver with ascites (HCC)   Depression with anxiety   ADHD   Diabetes mellitus without complication (HCC)   Normocytic anemia   Bilateral lower extremity edema   Pressure injury of skin   Pancreatic lesion   Elevated lactic acid level   Hypomagnesemia   Protein-calorie malnutrition, severe   Chronic pain syndrome   History of gastric bypass   Neuropathy   Brief Hospital Course (including significant findings, care, treatment, and services provided and events leading to death)  History alcohol-induced cirrhosis with significant decline the last several months, presenting with decompensated cirrhosis and SIRS. Treated with broad-spectrum antibiotics, albumin, fluids, blood transfusion. Developed worsening hypoxia, renal failure, and encephalopathy. Decision made to transition to comfort care the evening of 2023/05/31 and the patient died later that evening.  Pertinent Labs and Studies  Significant Diagnostic Studies DG Chest Port 1 View  Result Date: 05/24/2022 CLINICAL DATA:  Hyperglycemia. EXAM: PORTABLE CHEST 1 VIEW COMPARISON:  05/24/2022 and CT chest 04/29/2022. FINDINGS: Trachea is midline. Heart size normal. Increasing mixed interstitial and airspace opacification with left lower lobe collapse/consolidation. Bilateral pleural effusions. IMPRESSION: 1. Worsening  mixed interstitial and airspace opacification and pleural effusions, worrisome for heart failure. 2. Left lower lobe collapse/consolidation. Difficult to exclude pneumonia. . Electronically Signed   By: Leanna Battles M.D.   On: 05/24/2022 16:02   DG Chest Port 1 View  Result Date: 05/24/2022 CLINICAL DATA:  Hypoxia. EXAM: PORTABLE CHEST 1 VIEW COMPARISON:  Chest x-ray 05/23/2022. FINDINGS: Right-sided skin fold. No visible pneumothorax. Similar left basilar opacities and low lung volumes. No definite pleural effusion. Cardiomediastinal silhouette is within normal limits. Upper abdominal clips. IMPRESSION: Similar left basilar opacities and low lung volumes. Electronically Signed   By: Feliberto Harts M.D.   On: 05/24/2022 09:17   DG Chest Port 1 View  Result Date: 05/23/2022 CLINICAL DATA:  Weakness and inability to walk EXAM: PORTABLE CHEST 1 VIEW COMPARISON:  04/29/2022 and the CTA chest of later in the day FINDINGS: Patient rotated to the right. Numerous leads and wires project over the chest. Midline trachea. Normal heart size for level of inspiration. No pleural effusion or pneumothorax. Development of bilateral, mid and lower lung predominant interstitial and airspace disease. IMPRESSION: Diminished lung volumes with development of bilateral lower lobe interstitial and airspace disease. Favor infection/aspiration over pulmonary edema, especially given lack of pleural fluid. Electronically Signed   By: Jeronimo Greaves M.D.   On: 05/23/2022 17:24   ECHOCARDIOGRAM COMPLETE  Result Date: 05/23/2022    ECHOCARDIOGRAM REPORT   Patient Name:   Stephanie Levy Date of Exam: 05/23/2022 Medical Rec #:  [id number]     Height:       68.0 in Accession #:    [id number]    Weight:       153.0 lb Date of Birth:  1981/02/12     BSA:  1.824 m Patient Age:    41 years      BP:           104/77 mmHg Patient Gender: F             HR:           93 bpm. Exam Location:  [facility] Procedure: 2D Echo Indications:      Cardiomyopathy I42.9  History:         Patient has prior history of Echocardiogram examinations, most                  recent 10/14/2017.  Sonographer:     Overton Mam RDCS Referring Phys:  [id number] BRITTON L R[place] Diagnosing Phys: Julien Nordmann MD IMPRESSIONS  1. Left ventricular ejection fraction, by estimation, is 60 to 65%. The left ventricle has normal function. The left ventricle has no regional wall motion abnormalities. Left ventricular diastolic parameters were normal.  2. Right ventricular systolic function is normal. The right ventricular size is normal. Tricuspid regurgitation signal is inadequate for assessing PA pressure.  3. The mitral valve is normal in structure. No evidence of mitral valve regurgitation. No evidence of mitral stenosis.  4. The aortic valve is normal in structure. Aortic valve regurgitation is not visualized. No aortic stenosis is present.  5. The inferior vena cava is dilated in size with >50% respiratory variability, suggesting right atrial pressure of 8 mmHg. FINDINGS  Left Ventricle: Left ventricular ejection fraction, by estimation, is 60 to 65%. The left ventricle has normal function. The left ventricle has no regional wall motion abnormalities. The left ventricular internal cavity size was normal in size. There is  no left ventricular hypertrophy. Left ventricular diastolic parameters were normal. Right Ventricle: The right ventricular size is normal. No increase in right ventricular wall thickness. Right ventricular systolic function is normal. Tricuspid regurgitation signal is inadequate for assessing PA pressure. Left Atrium: Left atrial size was normal in size. Right Atrium: Right atrial size was normal in size. Pericardium: There is no evidence of pericardial effusion. Mitral Valve: The mitral valve is normal in structure. No evidence of mitral valve regurgitation. No evidence of mitral valve stenosis. Tricuspid Valve: The tricuspid valve is normal in  structure. Tricuspid valve regurgitation is mild . No evidence of tricuspid stenosis. Aortic Valve: The aortic valve is normal in structure. Aortic valve regurgitation is not visualized. No aortic stenosis is present. Aortic valve peak gradient measures 3.3 mmHg. Pulmonic Valve: The pulmonic valve was normal in structure. Pulmonic valve regurgitation is not visualized. No evidence of pulmonic stenosis. Aorta: The aortic root is normal in size and structure. Venous: The inferior vena cava is dilated in size with greater than 50% respiratory variability, suggesting right atrial pressure of 8 mmHg. IAS/Shunts: No atrial level shunt detected by color flow Doppler.  LEFT VENTRICLE PLAX 2D LVIDd:         4.00 cm     Diastology LVIDs:         2.40 cm     LV e' medial:    11.60 cm/s LV PW:         0.97 cm     LV E/e' medial:  7.2 LV IVS:        0.86 cm     LV e' lateral:   14.00 cm/s LVOT diam:     2.10 cm     LV E/e' lateral: 6.0 LV SV:         68 LV  SV Index:   37 LVOT Area:     3.46 cm  LV Volumes (MOD) LV vol d, MOD A2C: 80.4 ml LV vol d, MOD A4C: 48.3 ml LV vol s, MOD A2C: 26.6 ml LV vol s, MOD A4C: 18.2 ml LV SV MOD A2C:     53.8 ml LV SV MOD A4C:     48.3 ml LV SV MOD BP:      41.4 ml RIGHT VENTRICLE RV Basal diam:  2.67 cm RV S prime:     15.10 cm/s TAPSE (M-mode): 2.2 cm LEFT ATRIUM             Index        RIGHT ATRIUM          Index LA diam:        3.10 cm 1.70 cm/m   RA Area:     8.60 cm LA Vol (A2C):   36.8 ml 20.18 ml/m  RA Volume:   14.60 ml 8.01 ml/m LA Vol (A4C):   24.3 ml 13.32 ml/m LA Biplane Vol: 30.0 ml 16.45 ml/m  AORTIC VALVE                 PULMONIC VALVE AV Area (Vmax): 3.60 cm     PV Vmax:       0.75 m/s AV Vmax:        90.90 cm/s   PV Peak grad:  2.2 mmHg AV Peak Grad:   3.3 mmHg LVOT Vmax:      94.60 cm/s LVOT Vmean:     59.100 cm/s LVOT VTI:       0.196 m  AORTA Ao Root diam: 3.30 cm Ao Asc diam:  3.00 cm MITRAL VALVE               TRICUSPID VALVE MV Area (PHT): 4.80 cm    TV Peak grad:    18.8 mmHg MV Decel Time: 158 msec    TV Vmax:        2.17 m/s MV E velocity: 83.60 cm/s MV A velocity: 65.10 cm/s  SHUNTS MV E/A ratio:  1.28        Systemic VTI:  0.20 m                            Systemic Diam: 2.10 cm Julien Nordmann MD Electronically signed by Julien Nordmann MD Signature Date/Time: 05/23/2022/3:38:21 PM    Final    US Abdomen Complete  Result Date: 05/23/2022 CLINICAL DATA:  Ascites due to alcoholic cirrhosis. Evaluate for portal vein thrombus. EXAM: ABDOMEN ULTRASOUND COMPLETE COMPARISON:  04/03/2022 FINDINGS: Gallbladder: Previous cholecystectomy. Common bile duct: Diameter: 4 mm. Liver: Diffuse increased parenchymal echogenicity compatible with patient's known cirrhosis. No focal mass. Portal vein is patent on color Doppler imaging with normal direction of blood flow towards the liver. No evidence of portal vein thrombosis. IVC: No abnormality visualized. Pancreas: Visualized portion unremarkable. Spleen: Size and appearance within normal limits. Right Kidney: Length: 10.6 cm. Echogenicity within normal limits. No mass or hydronephrosis visualized. Left Kidney: Length: 9.6 cm. Echogenicity within normal limits. No mass or hydronephrosis visualized. Abdominal aorta: No aneurysm visualized. Other findings: Slight interval worsening moderate ascites. IMPRESSION: 1. Evidence of patient's known cirrhosis with slight interval worsening moderate ascites. Portal vein is patent without evidence of thrombosis. 2.  Prior cholecystectomy. Electronically Signed   By: Elberta Fortis M.D.   On: 05/23/2022 12:36   CT HEAD WO CONTRAST (  )  Result Date: June 17, 2022 CLINICAL DATA:  Head trauma, abnormal mental status.  Unknown cause. EXAM: CT HEAD WITHOUT CONTRAST TECHNIQUE: Contiguous axial images were obtained from the base of the skull through the vertex without intravenous contrast. RADIATION DOSE REDUCTION: This exam was performed according to the departmental dose-optimization program which  includes automated exposure control, adjustment of the mA and/or kV according to patient size and/or use of iterative reconstruction technique. COMPARISON:  CT head dated October 13, 2017. MRI examination dated October 13, 2017. FINDINGS: Brain: No evidence of acute infarction, hemorrhage, hydrocephalus, extra-axial collection or mass lesion/mass effect. Vascular: No hyperdense vessel or unexpected calcification. Skull: Normal. Negative for fracture or focal lesion. Sinuses/Orbits: No acute finding. Other: None. IMPRESSION: No acute intracranial abnormality. Electronically Signed   By: Larose Hires D.O.   On: Jun 17, 2022 21:30   US Venous Img Lower Bilateral  Result Date: 04/29/2022 CLINICAL DATA:  Swelling, erythema, and pain in the lower extremities. EXAM: BILATERAL LOWER EXTREMITY VENOUS DOPPLER ULTRASOUND TECHNIQUE: Gray-scale sonography with compression, as well as color and duplex ultrasound, were performed to evaluate the deep venous system(s) from the level of the common femoral vein through the popliteal and proximal calf veins. COMPARISON:  04/02/2022 FINDINGS: VENOUS Normal compressibility of the common femoral, superficial femoral, and popliteal veins, as well as the visualized calf veins. Visualized portions of profunda femoral vein and great saphenous vein unremarkable. No filling defects to suggest DVT on grayscale or color Doppler imaging. Doppler waveforms show normal direction of venous flow, normal respiratory plasticity and response to augmentation. Limited views of the contralateral common femoral vein are unremarkable. OTHER None. Limitations: none IMPRESSION: No findings of lower extremity DVT on either side. Electronically Signed   By: Gaylyn Rong M.D.   On: 04/29/2022 08:45   CT Angio Chest PE W and/or Wo Contrast  Result Date: 04/29/2022 CLINICAL DATA:  41 year old female with history of chest pain, abdominal pain and emesis. Shortness of breath. Bilateral leg swelling. EXAM:  CT ANGIOGRAPHY CHEST CT ABDOMEN AND PELVIS WITH CONTRAST TECHNIQUE: Multidetector CT imaging of the chest was performed using the standard protocol during bolus administration of intravenous contrast. Multiplanar CT image reconstructions and MIPs were obtained to evaluate the vascular anatomy. Multidetector CT imaging of the abdomen and pelvis was performed using the standard protocol during bolus administration of intravenous contrast. RADIATION DOSE REDUCTION: This exam was performed according to the departmental dose-optimization program which includes automated exposure control, adjustment of the mA and/or kV according to patient size and/or use of iterative reconstruction technique. CONTRAST:  OMNIPAQUE IOHEXOL 350 MG/ML SOLN COMPARISON:  CTA of the abdomen and pelvis 04/02/2022. FINDINGS: CTA CHEST FINDINGS Cardiovascular: No filling defects within the pulmonary arterial tree to suggest pulmonary embolism. Heart size is normal. There is no significant pericardial fluid, thickening or pericardial calcification. No atherosclerotic calcifications are noted in the thoracic aorta or the coronary arteries. Mediastinum/Nodes: No pathologically enlarged mediastinal or hilar lymph nodes. Esophagus is unremarkable in appearance. No axillary lymphadenopathy. Lungs/Pleura: Small bilateral pleural effusions (left-greater-than-right) lying dependently. Some passive atelectasis is noted in the dependent portions of the lower lobes of the lungs bilaterally (left-greater-than-right). No acute consolidative airspace disease. No definite suspicious appearing pulmonary nodules or masses are noted. Musculoskeletal: There are no aggressive appearing lytic or blastic lesions noted in the visualized portions of the skeleton. Review of the MIP images confirms the above findings. CT ABDOMEN and PELVIS FINDINGS Hepatobiliary: Liver has a slightly shrunken appearance and nodular contour, indicative of  underlying cirrhosis. No  discrete cystic or solid hepatic lesions. No intra or extrahepatic biliary ductal dilatation. Status post cholecystectomy. Pancreas: Diffuse atrophy throughout the pancreas. Low-attenuation lesions in the pancreas, most notably in the pancreatic tail where the largest of these lesions measures up to 1.1 x 1.5 cm (axial image 22 of series 2). No peripancreatic fluid collections or surrounding inflammatory changes. Spleen: Unremarkable. Adrenals/Urinary Tract: Bilateral kidneys and bilateral adrenal glands are normal in appearance. No hydroureteronephrosis. Urinary bladder is unremarkable in appearance. 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 No pathologic dilatation of small bowel or colon. Appendix is not confidently identified, potentially surgically absent. Vascular/Lymphatic: Atherosclerotic disease noted in the pelvic vasculature. No aneurysm or dissection noted in the abdominal or pelvic vasculature. Portal vein is patent measuring 13 mm in the porta hepatis. No lymphadenopathy noted in the abdomen or pelvis. Reproductive: Uterus and ovaries are unremarkable in appearance. Other: Moderate volume of ascites. Mild diffuse mesenteric edema. No pneumoperitoneum. Diffuse body wall edema. Musculoskeletal: Status post PLIF at L4-L5 with interbody graft at L4-L5 interspace. There are no aggressive appearing lytic or blastic lesions noted in the visualized portions of the skeleton. Review of the MIP images confirms the above findings. IMPRESSION: 1. No evidence of pulmonary embolism. 2. Small bilateral pleural effusions (left-greater-than-right) lying dependently with areas of passive atelectasis in the dependent portions of the lungs bilaterally. 3. Hepatic cirrhosis with moderate volume of ascites. 4. There is also diffuse body wall edema. Overall, imaging findings suggest a state of anasarca. 5. Severe atrophy throughout the pancreas with multiple small low-attenuation lesions,  largest of which is in the pancreatic tail measuring 1.1 x 1.5 cm. 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 6. Additional incidental findings, as above. Electronically Signed   By: Trudie Reed M.D.   On: 04/29/2022 06:46   CT ABDOMEN PELVIS W CONTRAST  Result Date: 04/29/2022 CLINICAL DATA:  41 year old female with history of chest pain, abdominal pain and emesis. Shortness of breath. Bilateral leg swelling. EXAM: CT ANGIOGRAPHY CHEST CT ABDOMEN AND PELVIS WITH CONTRAST TECHNIQUE: Multidetector CT imaging of the chest was performed using the standard protocol during bolus administration of intravenous contrast. Multiplanar CT image reconstructions and MIPs were obtained to evaluate the vascular anatomy. Multidetector CT imaging of the abdomen and pelvis was performed using the standard protocol during bolus administration of intravenous contrast. RADIATION DOSE REDUCTION: This exam was performed according to the departmental dose-optimization program which includes automated exposure control, adjustment of the mA and/or kV according to patient size and/or use of iterative reconstruction technique. CONTRAST:  OMNIPAQUE IOHEXOL 350 MG/ML SOLN COMPARISON:  CTA of the abdomen and pelvis 04/02/2022. FINDINGS: CTA CHEST FINDINGS Cardiovascular: No filling defects within the pulmonary arterial tree to suggest pulmonary embolism. Heart size is normal. There is no significant pericardial fluid, thickening or pericardial calcification. No atherosclerotic calcifications are noted in the thoracic aorta or the coronary arteries. Mediastinum/Nodes: No pathologically enlarged mediastinal or hilar lymph nodes. Esophagus is unremarkable in appearance. No axillary lymphadenopathy.  Lungs/Pleura: Small bilateral pleural effusions (left-greater-than-right) lying dependently. Some passive atelectasis is noted in the dependent portions of the lower lobes of the lungs bilaterally (left-greater-than-right). No acute consolidative airspace disease. No definite suspicious appearing pulmonary nodules or masses are noted. Musculoskeletal: There are no aggressive  appearing lytic or blastic lesions noted in the visualized portions of the skeleton. Review of the MIP images confirms the above findings. CT ABDOMEN and PELVIS FINDINGS Hepatobiliary: Liver has a slightly shrunken appearance and nodular contour, indicative of underlying cirrhosis. No discrete cystic or solid hepatic lesions. No intra or extrahepatic biliary ductal dilatation. Status post cholecystectomy. Pancreas: Diffuse atrophy throughout the pancreas. Low-attenuation lesions in the pancreas, most notably in the pancreatic tail where the largest of these lesions measures up to 1.1 x 1.5 cm (axial image 22 of series 2). No peripancreatic fluid collections or surrounding inflammatory changes. Spleen: Unremarkable. Adrenals/Urinary Tract: Bilateral kidneys and bilateral adrenal glands are normal in appearance. No hydroureteronephrosis. Urinary bladder is unremarkable in appearance. 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 No pathologic dilatation of small bowel or colon. Appendix is not confidently identified, potentially surgically absent. Vascular/Lymphatic: Atherosclerotic disease noted in the pelvic vasculature. No aneurysm or dissection noted in the abdominal or pelvic vasculature. Portal vein is patent measuring 13 mm in the porta hepatis. No lymphadenopathy noted in the abdomen or pelvis. Reproductive: Uterus and ovaries are unremarkable in appearance. Other: Moderate volume of ascites. Mild diffuse mesenteric edema. No pneumoperitoneum. Diffuse body wall edema. Musculoskeletal: Status post PLIF at L4-L5  with interbody graft at L4-L5 interspace. There are no aggressive appearing lytic or blastic lesions noted in the visualized portions of the skeleton. Review of the MIP images confirms the above findings. IMPRESSION: 1. No evidence of pulmonary embolism. 2. Small bilateral pleural effusions (left-greater-than-right) lying dependently with areas of passive atelectasis in the dependent portions of the lungs bilaterally. 3. Hepatic cirrhosis with moderate volume of ascites. 4. There is also diffuse body wall edema. Overall, imaging findings suggest a state of anasarca. 5. Severe atrophy throughout the pancreas with multiple small low-attenuation lesions, largest of which is in the pancreatic tail measuring 1.1 x 1.5 cm. 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 6. Additional incidental findings, as above. Electronically Signed   By: Trudie Reed M.D.   On: 04/29/2022 06:46   DG Chest Portable 1 View  Result Date: 04/29/2022 CLINICAL DATA:  41 year old female with history of chest pain and shortness of breath. EXAM: PORTABLE CHEST 1 VIEW COMPARISON:  Chest x-ray 08/25/2019. FINDINGS: Patchy areas of interstitial prominence an ill-defined airspace disease noted throughout the left mid to lower lung. Right lung is clear. No pleural effusions. No pneumothorax. No evidence of pulmonary edema. Heart size is normal. Upper mediastinal contours are within normal limits. IMPRESSION: 1. Probable left lower lobe bronchopneumonia, as above. Followup PA and lateral chest X-ray is recommended in 3-4 weeks following trial of antibiotic therapy to ensure resolution and exclude underlying malignancy. Electronically Signed   By: Trudie Reed M.D.   On: 04/29/2022 05:29     Microbiology Recent Results (from the past 240 hour(s))  SARS Coronavirus 2 by RT PCR (hospital order, performed in [facility] hospital lab) *cepheid single result test* Anterior Nasal Swab     Status: None   Collection Time: 2022/06/14 12:08 PM   Specimen: Anterior Nasal Swab  Result Value Ref Range Status   SARS Coronavirus 2 by RT PCR NEGATIVE NEGATIVE Final    Comment: (NOTE) SARS-CoV-2 target nucleic acids are NOT DETECTED.  The SARS-CoV-2 RNA is generally detectable in  upper and lower respiratory specimens during the acute phase of infection. The lowest concentration of SARS-CoV-2 viral copies this assay can detect is 250 copies / mL. A negative result does not preclude SARS-CoV-2 infection and should not be used as the sole basis for treatment or other patient management decisions.  A negative result may occur with improper specimen collection / handling, submission of specimen other than nasopharyngeal swab, presence of viral mutation(s) within the areas targeted by this assay, and inadequate number of viral copies (<250 copies / mL). A negative result must be combined with clinical observations, patient history, and epidemiological information.  Fact Sheet for Patients:   RoadLapTop.co.za  Fact Sheet for Healthcare Providers: http://kim-miller.com/  This test is not yet approved or  cleared by the [place] FDA and has been authorized for detection and/or diagnosis of SARS-CoV-2 by FDA under an Emergency Use Authorization (EUA).  This EUA will remain in effect (meaning this test can be used) for the duration of the COVID-19 declaration under Section 564(b)(1) of the Act, 21 U.S.C. section 360bbb-3(b)(1), unless the authorization is terminated or revoked sooner.  Performed at [facility], [address] [place], [place] [address]   Blood culture (routine x 2)     Status: None (Preliminary result)    Collection Time: 2022-06-14 12:31 PM   Specimen: BLOOD  Result Value Ref Range Status   Specimen Description BLOOD BLOOD RIGHT FOREARM  Final   Special Requests   Final    BOTTLES DRAWN AEROBIC AND ANAEROBIC Blood Culture adequate volume   Culture   Final    NO GROWTH 4 DAYS Performed at [facility], [address], [place], [place] [address]    Report Status PENDING  Incomplete  Blood culture (routine x 2)     Status: None (Preliminary result)   Collection Time: 06/14/2022 12:31 PM   Specimen: BLOOD  Result Value Ref Range Status   Specimen Description BLOOD BLOOD LEFT WRIST  Final   Special Requests   Final    BOTTLES DRAWN AEROBIC AND ANAEROBIC Blood Culture adequate volume   Culture   Final    NO GROWTH 4 DAYS Performed at [facility], [address] [place], [place] [address]    Report Status PENDING  Incomplete  Urine Culture     Status: None   Collection Time: 2022-06-14  8:36 PM   Specimen: Urine, Clean Catch  Result Value Ref Range Status   Specimen Description   Final    URINE, CLEAN CATCH Performed at [facility], [address] [place], [place] [address]    Special Requests   Final    NONE Performed at [facility], [address] [place], [place] [address]    Culture   Final    NO GROWTH Performed at [facility], [address], [place], [place] [address]    Report Status 05/24/2022 FINAL  Final    Lab Basic Metabolic Panel: Recent Labs  Lab 06/14/22 1450 06-14-2022 1942 05/23/22 0455 05/23/22 1648 05/24/22 0504 05/29/2022 0415  NA  --  135 139 137 139 142  K  --  2.7* 3.0* 3.3* 3.4* 3.0*  CL  --  108 109 106 111 113*  CO2  --  16* 18* 18* 19* 20*  GLUCOSE  --  324* 174* 147* 134* 128*  BUN  --  13 15 16 15 15   CREATININE  --  1.55* 1.93* 1.84* 1.77* 1.63*  CALCIUM  --  6.2* 7.7* 8.2* 8.0* 8.5*  MG 1.4* 1.6* 2.7*  --  2.4 2.1  PHOS  --  3.7 4.5  --  3.8 3.0   Liver Function Tests: Recent Labs  Lab  05/17/2022 1158 05/23/22 0455 06-06-2022 0415  AST 36 32 27  ALT 32 21 12  ALKPHOS 473* 317* 240*  BILITOT 1.7* 1.7* 2.3*  PROT 5.0* 4.7* 4.7*  ALBUMIN <1.5* 2.2* 2.8*   Recent Labs  Lab 05/30/2022 1158  LIPASE 16   Recent Labs  Lab 05/18/2022 1158 05/23/22 0455  AMMONIA 95* 66*   CBC: Recent Labs  Lab 05/19/2022 1158 05/08/2022 2036 05/23/22 0455 05/23/22 1512 05/23/22 2106 05/24/22 0504 2022/06/06 0415  WBC 7.5 6.8 8.2  --   --  8.0 10.2  NEUTROABS 5.4  --  6.2  --   --  5.2 7.2  HGB 7.7* 6.3* 6.7* 7.3* 7.8* 7.7* 9.2*  HCT 23.6* 18.1* 19.7* 21.5* 22.7* 22.5* 26.7*  MCV 81.7 78.7* 80.4  --   --  76.8* 78.1*  PLT 155 111* 110*  --   --  80* 79*   Cardiac Enzymes: Recent Labs  Lab 05/15/2022 1158 05/23/22 1127  CKTOTAL 66 26*   Sepsis Labs: Recent Labs  Lab 05/13/2022 2036 05/23/22 0455 05/23/22 1359 05/24/22 0504 06-06-22 0415  WBC 6.8 8.2  --  8.0 10.2  LATICACIDVEN 4.3* 6.6* 3.9* 3.3*  --     Procedures/Operations  none   Matthew Cina B Allahna Husband 05/26/2022, 9:49 AM

## 2022-06-04 NOTE — Progress Notes (Signed)
SLP Cancellation Note  Patient Details Name: Lorann Tani MRN: [id number] DOB: 1981-08-29   Cancelled treatment:       Reason Eval/Treat Not Completed: Patient declined, no reason specified (chart reviewed; consulted NSG and MD re: pt's status) Pt tolerated few po's w/ NSG yesterday -- no overt s/s of aspiration were noted per NSG report. However, pt intermittently orally held foods in her mouth at times not engaged in the task of oral intake -- NSG stopped the feeding when this occurred.  Pt is indicating "pain" and little to no desire for oral intake this morning. MD(GI) is present and discussing Palliative Care/Hospice services and overall GOC w/ Brother, pt; NSG present too.  In setting of pt's declined participation and desire for oral intake, and pending GOC discussions w/ Palliative care, will hold on dysphagia tx visit and will attempt later for any education if needed. NSG agreed.      Jerilynn Som, MS, CCC-SLP Speech Language Pathologist Rehab Services; [facility] [phone] (ascom) Thanya Cegielski 06/16/2022, 9:51 AM

## 2022-06-04 NOTE — Progress Notes (Addendum)
1700: Family requested to initiate comfort care. Wouk, MD notified; Comfort care orders placed. Pt given 2mg  Morphine, transitioned from heated HFNC to [place]. Brother, sister, and wife at bedside. Questions answered; Emotional support provided. Chaplain called and comfort tray ordered.

## 2022-06-04 DEATH — deceased

## 2022-06-09 ENCOUNTER — Ambulatory Visit: Payer: BC Managed Care – PPO | Admitting: Gastroenterology

## 2022-06-16 LAB — BLOOD GAS, VENOUS
Acid-base deficit: 4 mmol/L — ABNORMAL HIGH (ref 0.0–2.0)
Bicarbonate: 17.2 mmol/L — ABNORMAL LOW (ref 20.0–28.0)
Patient temperature: 37
pCO2, Ven: 22 mmHg — ABNORMAL LOW (ref 44–60)
pH, Ven: 7.5 — ABNORMAL HIGH (ref 7.25–7.43)
pO2, Ven: 185 mmHg — ABNORMAL HIGH (ref 32–45)

## 2022-08-11 ENCOUNTER — Ambulatory Visit: Payer: BC Managed Care – PPO | Admitting: Gastroenterology
